# Patient Record
Sex: Male | Born: 1945 | Race: White | Hispanic: No | Smoking: Former smoker
Health system: Southern US, Community
[De-identification: ages and names within clinical notes are randomized; demographics above are authoritative.]

## PROBLEM LIST (undated history)

## (undated) DIAGNOSIS — G4730 Sleep apnea, unspecified: Secondary | ICD-10-CM

## (undated) DIAGNOSIS — I10 Essential (primary) hypertension: Secondary | ICD-10-CM

## (undated) DIAGNOSIS — I2510 Atherosclerotic heart disease of native coronary artery without angina pectoris: Secondary | ICD-10-CM

## (undated) DIAGNOSIS — M1990 Unspecified osteoarthritis, unspecified site: Secondary | ICD-10-CM

## (undated) DIAGNOSIS — M549 Dorsalgia, unspecified: Secondary | ICD-10-CM

## (undated) DIAGNOSIS — I219 Acute myocardial infarction, unspecified: Secondary | ICD-10-CM

## (undated) DIAGNOSIS — D860 Sarcoidosis of lung: Secondary | ICD-10-CM

## (undated) DIAGNOSIS — J449 Chronic obstructive pulmonary disease, unspecified: Secondary | ICD-10-CM

## (undated) DIAGNOSIS — G8929 Other chronic pain: Secondary | ICD-10-CM

## (undated) HISTORY — PX: HAND SURGERY: SHX662

## (undated) HISTORY — PX: OTHER SURGICAL HISTORY: SHX169

## (undated) HISTORY — DX: Dorsalgia, unspecified: M54.9

## (undated) HISTORY — PX: CARDIAC CATHETERIZATION: SHX172

## (undated) HISTORY — DX: Other chronic pain: G89.29

## (undated) HISTORY — DX: Atherosclerotic heart disease of native coronary artery without angina pectoris: I25.10

## (undated) HISTORY — DX: Acute myocardial infarction, unspecified: I21.9

## (undated) HISTORY — DX: Unspecified osteoarthritis, unspecified site: M19.90

## (undated) HISTORY — DX: Chronic obstructive pulmonary disease, unspecified: J44.9

## (undated) HISTORY — PX: BACK SURGERY: SHX140

## (undated) HISTORY — DX: Essential (primary) hypertension: I10

---

## 2009-03-28 ENCOUNTER — Ambulatory Visit: Payer: Self-pay | Admitting: Cardiology

## 2009-03-28 ENCOUNTER — Inpatient Hospital Stay (HOSPITAL_COMMUNITY): Admission: EM | Admit: 2009-03-28 | Discharge: 2009-03-31 | Payer: Self-pay | Admitting: Emergency Medicine

## 2009-03-29 ENCOUNTER — Encounter: Payer: Self-pay | Admitting: Cardiology

## 2009-04-09 DIAGNOSIS — I10 Essential (primary) hypertension: Secondary | ICD-10-CM | POA: Insufficient documentation

## 2009-04-09 DIAGNOSIS — M1990 Unspecified osteoarthritis, unspecified site: Secondary | ICD-10-CM

## 2009-04-09 DIAGNOSIS — I219 Acute myocardial infarction, unspecified: Secondary | ICD-10-CM | POA: Insufficient documentation

## 2009-04-09 DIAGNOSIS — M5136 Other intervertebral disc degeneration, lumbar region: Secondary | ICD-10-CM

## 2009-04-10 ENCOUNTER — Ambulatory Visit: Payer: Self-pay | Admitting: Cardiology

## 2009-04-10 ENCOUNTER — Encounter: Payer: Self-pay | Admitting: Cardiology

## 2009-04-10 DIAGNOSIS — E782 Mixed hyperlipidemia: Secondary | ICD-10-CM | POA: Insufficient documentation

## 2009-04-16 ENCOUNTER — Encounter: Payer: Self-pay | Admitting: Cardiology

## 2009-04-16 LAB — CONVERTED CEMR LAB
AST: 24 units/L
BUN: 16 mg/dL
Creatinine, Ser: 0.9 mg/dL
HCT: 38.8 %
Hemoglobin: 13.2 g/dL
MCV: 83.1 fL
Platelets: 262 10*3/uL
Potassium: 4 meq/L
Triglyceride fasting, serum: 82 mg/dL

## 2009-05-04 ENCOUNTER — Encounter: Payer: Self-pay | Admitting: Cardiology

## 2009-05-29 ENCOUNTER — Ambulatory Visit: Payer: Self-pay | Admitting: Cardiology

## 2009-05-29 ENCOUNTER — Ambulatory Visit: Payer: Self-pay

## 2010-06-04 ENCOUNTER — Telehealth (INDEPENDENT_AMBULATORY_CARE_PROVIDER_SITE_OTHER): Payer: Self-pay | Admitting: *Deleted

## 2010-12-19 ENCOUNTER — Ambulatory Visit: Payer: Self-pay | Admitting: Cardiology

## 2010-12-19 ENCOUNTER — Encounter: Payer: Self-pay | Admitting: Cardiology

## 2010-12-19 DIAGNOSIS — I2510 Atherosclerotic heart disease of native coronary artery without angina pectoris: Secondary | ICD-10-CM | POA: Insufficient documentation

## 2010-12-19 DIAGNOSIS — I10 Essential (primary) hypertension: Secondary | ICD-10-CM

## 2010-12-19 DIAGNOSIS — Z8673 Personal history of transient ischemic attack (TIA), and cerebral infarction without residual deficits: Secondary | ICD-10-CM

## 2010-12-27 ENCOUNTER — Ambulatory Visit: Payer: Self-pay

## 2010-12-27 ENCOUNTER — Ambulatory Visit (HOSPITAL_COMMUNITY)
Admission: RE | Admit: 2010-12-27 | Discharge: 2010-12-27 | Payer: Self-pay | Source: Home / Self Care | Attending: Cardiology | Admitting: Cardiology

## 2010-12-27 ENCOUNTER — Telehealth: Payer: Self-pay | Admitting: Cardiology

## 2011-01-08 ENCOUNTER — Telehealth: Payer: Self-pay | Admitting: Cardiology

## 2011-01-08 LAB — CONVERTED CEMR LAB
Basophils Relative: 1.6 % (ref 0.0–3.0)
Calcium: 9 mg/dL (ref 8.4–10.5)
Chloride: 101 meq/L (ref 96–112)
Cholesterol: 149 mg/dL (ref 0–200)
Creatinine, Ser: 1 mg/dL (ref 0.4–1.5)
Eosinophils Relative: 3.2 % (ref 0.0–5.0)
HDL: 31.3 mg/dL — ABNORMAL LOW (ref >39.00)
LDL Cholesterol: 80 mg/dL (ref 0–99)
Lymphocytes Relative: 27.6 % (ref 12.0–46.0)
Neutrophils Relative %: 59.8 % (ref 43.0–77.0)
RBC: 5.03 M/uL (ref 4.22–5.81)
Sodium: 137 meq/L (ref 135–145)
Triglycerides: 187 mg/dL — ABNORMAL HIGH (ref 0.0–149.0)
VLDL: 37.4 mg/dL (ref 0.0–40.0)
WBC: 7.3 10*3/uL (ref 4.5–10.5)

## 2011-01-28 NOTE — Progress Notes (Signed)
  Request Recieved from [facility] sent to [address] [facility] [place]  June 04, 2010 11:18 AM

## 2011-01-28 NOTE — Assessment & Plan Note (Signed)
Summary: [facility]   CC:  Post Hosp F/U.  History of Present Illness: No chest pain.  Does take meds for back, including NSAIDS, but has held off.  Meds were sent to the [place].  Gets pain meds for back at the [place].  Pepcid does not work as well as Omeprazole. Is doing relatively well. Pt a non smoker.  Current Medications (verified): 1)  Aspirin 81 Mg Tbec (Aspirin) .... Take One Tablet By Mouth Daily 2)  Crestor 40 Mg Tabs (Rosuvastatin Calcium) .... Take 1 Tablet By Mouth Once A Day 3)  Plavix 75 Mg Tabs (Clopidogrel Bisulfate) .... Take One Tablet By Mouth Daily 4)  Toprol Xl 50 Mg Xr24h-Tab (Metoprolol Succinate) .... Take 1 Once Daily 5)  Lisinopril 5 Mg Tabs (Lisinopril) .... Take One Tablet By Mouth Daily 6)  Nitrostat 0.4 Mg Subl (Nitroglycerin) .... One Tablet Under Tongue Every 5 Minutes As Needed For Chest Pain---May Repeat Times Three 7)  Omeprazole 20 Mg Tbec (Omeprazole) .... Take 1 Two Times A Day As Needed 8)  Potassium Chloride Crys Cr 20 Meq Cr-Tabs (Potassium Chloride Crys Cr) .... Take 1 As Needed 9)  Naproxen .... As Needed 10)  Combivent 103-18 Mcg/act Aero (Ipratropium-Albuterol) .... Take 2 Puffs Once Daily 11)  Cyclobenzaprine Hcl 10 Mg Tabs (Cyclobenzaprine Hcl) .... Take 1 Two Times A Day As Needed 12)  Hydrocodone-Acetaminophen 10-500 Mg Tabs (Hydrocodone-Acetaminophen) .... Take As Needed 13)  Loratadine 10 Mg Tabs (Loratadine) .... Take 1 As Needed 14)  Diclofenac Sodium 75 Mg Tbec (Diclofenac Sodium) .... Take 1 Two Times A Day 15)  Asmanex 60 Metered Doses 220 Mcg/inh Aepb (Mometasone Furoate) .... Take 1 At Bedtime 16)  Afrin Nasal Spray 0.05 % Soln (Oxymetazoline Hcl) .... As Needed  Allergies: 1)  ! Sulfa  Family History:     M alive at 21  no med prob    F  shot himself in mouth at 44    One brother all at [place]    One sister good health            Vital Signs:  Patient profile:   65 year old male Height:      72 inches Weight:      243  pounds BMI:     33.08 Pulse rate:   66 / minute BP sitting:   110 / 70  (left arm) Cuff size:   regular  Vitals Entered By: Stanton Kidney, EMT-P (April 10, 2009 12:29 PM)  Physical Exam  General:  Well developed, well nourished, in no acute distress. Lungs:  Clear to auscultation and percussion Heart:  Cor reg.  Stable.  No murmur, or rub.  Positive S4. Extremities:  No edema   EKG  Procedure date:  04/10/2009  Findings:      Normal at present  Impression & Recommendations:  Problem # 1:  MYOCARDIAL INFARCTION, ACUTE (ICD-410.90)  His updated medication list for this problem includes:    Aspirin 81 Mg Tbec (Aspirin) .Marland Kitchen... Take one tablet by mouth daily    Plavix 75 Mg Tabs (Clopidogrel bisulfate) .Marland Kitchen... Take one tablet by mouth daily    Toprol Xl 50 Mg Xr24h-tab (Metoprolol succinate) .Marland Kitchen... Take 1 once daily    Lisinopril 5 Mg Tabs (Lisinopril) .Marland Kitchen... Take one tablet by mouth daily    Nitrostat 0.4 Mg Subl (Nitroglycerin) ..... One tablet under tongue every 5 minutes as needed for chest pain---may repeat times three Stable at present.  Has more reflux.  May resume Omeprazole at 30 days as opposed to Pepcid. May resume driving.  Problem # 2:  HYPERTENSION (ICD-401.9)  His updated medication list for this problem includes:    Aspirin 81 Mg Tbec (Aspirin) .Marland Kitchen... Take one tablet by mouth daily    Toprol Xl 50 Mg Xr24h-tab (Metoprolol succinate) .Marland Kitchen... Take 1 once daily    Lisinopril 5 Mg Tabs (Lisinopril) .Marland Kitchen... Take one tablet by mouth daily Stable  Problem # 3:  BACK PAIN, CHRONIC (ICD-724.5) Issues of NSAIDS reviewed with patient.  Problem # 4:  HYPERLIPIDEMIA TYPE IIB / III (ICD-272.2)  His updated medication list for this problem includes:    Crestor 40 Mg Tabs (Rosuvastatin calcium) .Marland Kitchen... Take 1 tablet by mouth once a day Need followup laboratories at the [place].  Patient Instructions: 1)  Copy of CT from the hospital given to patient  to carry to the [place]. 2)  Your  physician recommends that you return for lab work in: Labs to be drawn at the [place]. Order provided for BMET, CBC, LIPID. 3)  Your physician has requested that you have an exercise tolerance test.  For further information please visit [email or web address]  Please also follow instruction sheet, as given. (6 weeks)

## 2011-01-30 NOTE — Assessment & Plan Note (Signed)
Summary: past due follow up/mt   Visit Type:  Follow-up  CC:  chest pains.  History of Present Illness: Patient had a shortly lived episode of left arm, leg weakness, although without speech difficulties.  It last for about an hour, and then was a little noticeable next day.  None since.   Denies significant chest pain.  Was seen at [facility], and workup not recommended.    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 2 Tablets Am and 2 Tablets Pm 2)  Simvastatin 40 Mg Tabs (Simvastatin) .... Take 1/2 Tablet By Mouth Daily At Bedtime 3)  Metoprolol Tartrate 50 Mg Tabs (Metoprolol Tartrate) .... Take One Tablet By Mouth Twice A Day 4)  Nitrostat 0.4 Mg Subl (Nitroglycerin) .... One Tablet Under Tongue Every 5 Minutes As Needed For Chest Pain---May Repeat Times Three 5)  Omeprazole 20 Mg Tbec (Omeprazole) .... Take 1 Two Times A Day As Needed 6)  Potassium Chloride Crys Cr 20 Meq Cr-Tabs (Potassium Chloride Crys Cr) .... Take 1 As Needed 7)  Combivent 103-18 Mcg/act Aero (Ipratropium-Albuterol) .... Take 2 Puffs Once Daily 8)  Cyclobenzaprine Hcl 10 Mg Tabs (Cyclobenzaprine Hcl) .... Take 1 Two Times A Day As Needed 9)  Percocet 5-325 Mg Tabs (Oxycodone-Acetaminophen) .... As Needed 10)  Loratadine 10 Mg Tabs (Loratadine) .... Take 1 As Needed 11)  Afrin Nasal Spray 0.05 % Soln (Oxymetazoline Hcl) .... As Needed 12)  Spiriva Handihaler 18 Mcg Caps (Tiotropium Bromide Monohydrate) .... 2 Puffs Two Times A Day  Allergies: 1)  ! Sulfa  Vital Signs:  Patient profile:   65 year old male Height:      72 inches Weight:      262 pounds BMI:     35.66 Pulse  rate:   61 / minute Pulse rhythm:   regular Resp:     18 per minute BP sitting:   134 / 88  (left arm) Cuff size:   large  Vitals Entered By: Vikki Ports (December 19, 2010 2:45 PM)  Physical Exam  General:  Well developed, well nourished, in no acute distress. Head:  normocephalic and atraumatic Eyes:  PERRLA/EOM intact; conjunctiva and lids normal. Neck:  No carotid bruits.   Lungs:  Clear bilaterally to auscultation and percussion. Heart:  PMI non displaced. NOrmal S1 and S2.  No gallop rub or murmur.   Abdomen:  Bowel sounds positive; abdomen soft and non-tender without masses, organomegaly, or hernias noted. No hepatosplenomegaly. Pulses:  pulses normal in all 4 extremities.  No femoral bruits.  Extremities:  No clubbing or cyanosis.   EKG  Procedure date:  12/19/2010  Findings:      NSR.  WNL.  Cardiac Cath  Procedure date:  03/28/2009  Findings:       CONCLUSIONS:   1. Mild reduction in left ventricular function with an apical wall       motion abnormality.   2. Ruptured plaque in the LAD just after the takeoff the diagonal       successfully stented using a non-drug-eluting platform with       excellent post dilatation.      DISPOSITION:  The patient will be treated medically.  He will be given   Plavix.  He will probably require 3-4 days of hospitalization.  We will   review the options with him.   Impression & Recommendations:  Problem # 1:  CAD, NATIVE VESSEL (ICD-414.01) stable at present.  rare episode of chest pain.  Denies progressive symptoms. The following medications were removed from the medication list:    Plavix 75 Mg Tabs (Clopidogrel bisulfate) .Marland Kitchen... Take one tablet by mouth daily    Lisinopril 5 Mg Tabs (Lisinopril) .Marland Kitchen... Take one tablet by mouth daily His updated medication list for this problem includes:    Aspirin 81 Mg Tbec (Aspirin) .Marland Kitchen... 2 tablets am and 2 tablets pm    Metoprolol Tartrate 50 Mg Tabs (Metoprolol tartrate) .Marland Kitchen... Take  one tablet by mouth twice a day    Nitrostat 0.4 Mg Subl (Nitroglycerin) ..... One tablet under tongue every 5 minutes as needed for chest pain---may repeat times three    Amlodipine Besylate 5 Mg Tabs (Amlodipine besylate) .Marland Kitchen... Take one tablet by mouth daily  Problem # 2:  HYPERTENSION, BENIGN (ICD-401.1) Not well controlled.  Will add amlodipine to improve BP control The following medications were removed from the medication list:    Lisinopril 5 Mg Tabs (Lisinopril) .Marland Kitchen... Take one tablet by mouth daily His updated medication list for this problem includes:    Aspirin 81 Mg Tbec (Aspirin) .Marland Kitchen... 2 tablets am and 2 tablets pm    Metoprolol Tartrate 50 Mg Tabs (Metoprolol tartrate) .Marland Kitchen... Take one tablet by mouth twice a day    Amlodipine Besylate 5 Mg Tabs (Amlodipine besylate) .Marland Kitchen... 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 Take one tablet by mouth daily at bedtime  Orders: TLB-BMP (Basic Metabolic Panel-BMET) (80048-METABOL) TLB-CBC Platelet - w/Differential (85025-CBCD) TLB-TSH (Thyroid Stimulating Hormone) (84443-TSH) TLB-Lipid Panel (80061-LIPID)  Problem # 4:  TIA (ICD-435.9) Not clear.  Episode was brief.  He was seen at [facility] and no workup recommended.  I suggested MRA, MRI, carotid doppler, although all likely to be negative, and patient decided against at the present time.  He did agree to an echocardiogram to rule out mural thrombus.  Remains on ASA.  Improve BP control.  Change lipid agent secondary to use of amlodipine.    Other Orders: EKG w/ Interpretation (93000) Echocardiogram (Echo)  Patient Instructions: 1)  Your physician recommends that you schedule a follow-up appointment in 6 weeks.  2)  Your physician has requested that you have an echocardiogram.  Echocardiography is a  painless test that uses sound waves to create images of your heart. It provides your doctor with information about the size and shape of your heart and how well your heart's chambers and valves are working.  This procedure takes approximately one hour. There are no restrictions for this procedure. 3)  Your physician has recommended you make the following change in your medication: START NORVASC 5mg  by mouth daily and STOP SIMVASTATIN and START PRAVASTATIN 40mg  by mouth daily  Prescriptions: AMLODIPINE BESYLATE 5 MG TABS (AMLODIPINE BESYLATE) Take one tablet by mouth daily  #30 x 6   Entered by:   Whitney Maeola Sarah RN   Authorized by:   Ronaldo Miyamoto, MD, [facility]   Signed by:   Whitney Maeola Sarah RN  on 12/19/2010   Method used:   [facility] to        [facility] [phone]* (retail)       [address]       [place], [place]  [address]       Ph: [phone] or [phone]       Fax: [phone]   RxID:   [id number] PRAVASTATIN SODIUM 40 MG TABS (PRAVASTATIN SODIUM) Take one tablet by mouth daily at bedtime  #30 x 6   Entered by:   Whitney Maeola Sarah RN   Authorized by:   Ronaldo Miyamoto, MD, [facility]   Signed by:   Ellender Hose RN on 12/19/2010   Method used:   [facility] to        [facility] [phone]* (retail)       [address]       [place], [place]  [address]       Ph: [phone] or [phone]       Fax: [phone]   RxID:   [id number]

## 2011-01-30 NOTE — Progress Notes (Addendum)
Summary: Reaction to Amlodipine  Phone Note Other Incoming   Details for Reason: reaction to Amlodipine Summary of Call: ++++this is not a phone note+++ pt in office today for 2d and mentioned to the front desk that he had a reaction this past Wednesday to Amlodipine. Pt c/o tongue and face swelling and difficulty breathing. He did not seek emergency care.  He has restarted his Metoprolol.  Will discuss w/DOD to see if additional medication needs to be added for BP control. Initial call taken by: Claris Gladden RN,  December 27, 2010 8:54 AM  Follow-up for Phone Call        spoke w/DOD and he will not prescribe further bp meds at this time. adv pt to continue w/Metoprolol and he expressed understanding.  Also adv pt that Dr. Riley Kill would be back in the office in a week and would review this message and would make further recommendations.  Pt checked BP today and systolic was 135.  Follow-up by: Claris Gladden RN,  December 27, 2010 10:26 AM

## 2011-01-30 NOTE — Progress Notes (Signed)
Summary: Echo results  Phone Note Call from Patient   Caller: Patient [phone] Reason for Call: Talk to Nurse, Lab or Test Results Summary of Call: pt calling re results of [place] on his heart Initial call taken by: Glynda Jaeger,  January 08, 2011 9:05 AM  Follow-up for Phone Call        I spoke with the pt and gave him the results of echo and labwork.  The pt stopped amlodipine due to reaction and is taking metoprolol.  The pt has not been monitoring his BP at home.  I asked the pt to start checking his BP and bring readings into his appt on 2/14 with Dr Riley Kill. The pt's statin was also changed to Pravastatin on 12/22.  The pt will have follow-up labs at his appt with Dr Riley Kill.  Follow-up by: Julieta Gutting, RN, BSN,  January 08, 2011 9:55 AM   New Allergies: ! AMLODIPINE BESYLATE New Allergies: ! AMLODIPINE BESYLATE

## 2011-02-11 ENCOUNTER — Encounter: Payer: Self-pay | Admitting: Cardiology

## 2011-02-11 ENCOUNTER — Ambulatory Visit (INDEPENDENT_AMBULATORY_CARE_PROVIDER_SITE_OTHER): Payer: Medicaid Other | Admitting: Cardiology

## 2011-02-11 DIAGNOSIS — I10 Essential (primary) hypertension: Secondary | ICD-10-CM

## 2011-02-11 DIAGNOSIS — G4733 Obstructive sleep apnea (adult) (pediatric): Secondary | ICD-10-CM | POA: Insufficient documentation

## 2011-02-11 DIAGNOSIS — I2510 Atherosclerotic heart disease of native coronary artery without angina pectoris: Secondary | ICD-10-CM

## 2011-02-11 DIAGNOSIS — E7800 Pure hypercholesterolemia, unspecified: Secondary | ICD-10-CM

## 2011-02-25 ENCOUNTER — Telehealth (INDEPENDENT_AMBULATORY_CARE_PROVIDER_SITE_OTHER): Payer: Self-pay | Admitting: *Deleted

## 2011-02-25 NOTE — Assessment & Plan Note (Signed)
Summary: per check out/sf need lipid and liver/pt will come in fasting...   Visit Type:  Follow-up  CC:  Allergic reaction to Amlodipine .  History of Present Illness: In general feels pretty good. The BP has come back in line.  He is now taking a whole beta blocker.  Had a reaction to amlodipine when he took.  It sounded real.   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 Take 1 Tablet By Mouth Once A Day 2)  Pravastatin Sodium 40 Mg Tabs (Pravastatin Sodium) .... Take One Tablet By Mouth Daily At Bedtime 3)  Metoprolol Tartrate 50 Mg Tabs (Metoprolol Tartrate) .... Take One Tablet By Mouth Twice A Day 4)  Nitrostat 0.4 Mg Subl (Nitroglycerin) .... One Tablet Under Tongue Every 5 Minutes As Needed For Chest Pain---May Repeat Times Three 5)  Omeprazole 20 Mg Tbec (Omeprazole) .... Take 1 Two Times A Day As Needed 6)  Potassium Chloride Crys Cr 20 Meq Cr-Tabs (Potassium Chloride Crys Cr) .... Take 1 As Needed 7)  Combivent 103-18 Mcg/act Aero (Ipratropium-Albuterol) .... Take 2 Puffs Once Daily 8)  Cyclobenzaprine Hcl 10 Mg Tabs (Cyclobenzaprine Hcl) .... Take 1 Two Times A Day As Needed 9)  Percocet 5-325 Mg Tabs (Oxycodone-Acetaminophen) .... As Needed 10)  Loratadine 10 Mg Tabs (Loratadine) .... Take 1 As Needed 11)  Afrin Nasal Spray 0.05 % Soln (Oxymetazoline Hcl) .... As Needed 12)  Spiriva Handihaler 18 Mcg Caps (Tiotropium Bromide Monohydrate) .... 2 Puffs Two Times A Day  Allergies: 1)  ! Sulfa 2)  ! Amlodipine Besylate  Vital Signs:  Patient profile:   65 year old male Height:      72  inches Weight:      255 pounds BMI:     34.71 Pulse rate:   68 / minute Pulse rhythm:   regular Resp:     18 per minute BP sitting:   114 / 75  (right arm) Cuff size:   large  Vitals Entered By: Vikki Ports (February 11, 2011 3:38 PM)  Physical Exam  General:  Well developed, well nourished, in no acute distress. Head:  normocephalic and atraumatic Eyes:  PERRLA/EOM intact; conjunctiva and lids normal. Lungs:  Clear bilaterally to auscultation and percussion. Heart:  PMI non displaced. Normal S1 and S2.  No murmur or rub.   Abdomen:  Bowel sounds positive; abdomen soft and non-tender without masses, organomegaly, or hernias noted. No hepatosplenomegaly. Pulses:  pulses normal in all 4 extremities Extremities:  No clubbing or cyanosis. Neurologic:  Alert and oriented x 3.   Impression & Recommendations:  Problem # 1:  CAD, NATIVE VESSEL (ICD-414.01) remaining stable at the present time.  No active chest pain. His updated medication list for this problem includes:    Aspirin 81 Mg Tbec (Aspirin) .Marland Kitchen... Take 1 tablet by mouth once a day    Metoprolol Tartrate 50 Mg Tabs (Metoprolol tartrate) .Marland Kitchen... Take one tablet by mouth twice a day    Nitrostat 0.4 Mg Subl (Nitroglycerin) ..... One tablet under tongue every 5 minutes as needed for chest pain---may repeat times three  Problem # 2:  HYPERTENSION, BENIGN (ICD-401.1) better controlled at the present time.  Will continue use of metoprolol. His updated medication list for this problem includes:    Aspirin 81 Mg Tbec (Aspirin) .Marland Kitchen... Take 1 tablet by mouth once a day    Metoprolol Tartrate 50 Mg Tabs (Metoprolol tartrate) .Marland Kitchen... 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 His updated medication list for this problem includes:    Pravastatin Sodium 40 Mg Tabs (Pravastatin sodium) .Marland Kitchen... Take one tablet by mouth daily at bedtime  Problem # 4:  SLEEP  APNEA, OBSTRUCTIVE (ICD-327.23)  Needs adjustment of his CPAP.  Has a machine.  Will get office visit with sleep medicine.   Orders: Pulmonary Referral (Pulmonary)  Patient Instructions: 1)  Your physician recommends that you schedule a follow-up appointment in: 4 months with Dr. Riley Kill 2)  Your physician recommends that you continue on your current medications as directed. Please refer to the Current Medication list given to you today. 3)  You have been referred to Dr. Shelle Iron for sleep referral

## 2011-03-04 ENCOUNTER — Institutional Professional Consult (permissible substitution): Payer: Medicaid Other | Admitting: Pulmonary Disease

## 2011-03-06 NOTE — Progress Notes (Signed)
Summary: refill meds  Phone Note Refill Request Message from:  Patient on February 25, 2011 10:35 AM  Refills Requested: Medication #1:  METOPROLOL TARTRATE 50 MG TABS Take one tablet by mouth twice a [facility] [place]    Method Requested: Fax to Local Pharmacy Initial call taken by: Lorne Skeens,  February 25, 2011 10:37 AM  Follow-up for Phone Call        Rx faxed to pharmacy. Vikki Ports  February 25, 2011 3:52 PM     Prescriptions: METOPROLOL TARTRATE 50 MG TABS (METOPROLOL TARTRATE) Take one tablet by mouth twice a day  #60 x 11   Entered by:   Vikki Ports   Authorized by:   Ronaldo Miyamoto, MD, [facility]   Signed by:   Vikki Ports on 02/25/2011   Method used:   [facility]       [facility]  [address] [phone]* (retail)       [address]       [place], [place]  [address]       Ph: [phone] or [phone]       Fax: [phone]   RxID:   [id number]

## 2011-04-09 LAB — BASIC METABOLIC PANEL
BUN: 15 mg/dL (ref 6–23)
CO2: 31 mEq/L (ref 19–32)
CO2: 32 mEq/L (ref 19–32)
Calcium: 7.9 mg/dL — ABNORMAL LOW (ref 8.4–10.5)
Calcium: 9.2 mg/dL (ref 8.4–10.5)
Chloride: 101 mEq/L (ref 96–112)
Creatinine, Ser: 0.97 mg/dL (ref 0.4–1.5)
GFR calc Af Amer: >60 mL/min (ref >60)
GFR calc non Af Amer: >60 mL/min (ref >60)
Glucose, Bld: 132 mg/dL — ABNORMAL HIGH (ref 70–99)
Glucose, Bld: 96 mg/dL (ref 70–99)
Potassium: 4.2 mEq/L (ref 3.5–5.1)
Sodium: 139 mEq/L (ref 135–145)

## 2011-04-09 LAB — LIPID PANEL
HDL: 25 mg/dL — ABNORMAL LOW (ref >39)
Total CHOL/HDL Ratio: 6.1 RATIO

## 2011-04-09 LAB — CARDIAC PANEL(CRET KIN+CKTOT+MB+TROPI)
Relative Index: 11.7 — ABNORMAL HIGH (ref 0.0–2.5)
Total CK: 942 U/L — ABNORMAL HIGH (ref 7–232)
Troponin I: 12.85 ng/mL (ref 0.00–0.06)
Troponin I: 14.68 ng/mL (ref 0.00–0.06)

## 2011-04-09 LAB — CBC
HCT: 35.4 % — ABNORMAL LOW (ref 39.0–52.0)
HCT: 37.4 % — ABNORMAL LOW (ref 39.0–52.0)
Hemoglobin: 12.4 g/dL — ABNORMAL LOW (ref 13.0–17.0)
Hemoglobin: 13.2 g/dL (ref 13.0–17.0)
MCHC: 35 g/dL (ref 30.0–36.0)
MCHC: 35.3 g/dL (ref 30.0–36.0)
MCV: 86.2 fL (ref 78.0–100.0)
RBC: 4.34 MIL/uL (ref 4.22–5.81)
RDW: 13.2 % (ref 11.5–15.5)

## 2011-04-09 LAB — DIFFERENTIAL
Basophils Absolute: 0 10*3/uL (ref 0.0–0.1)
Eosinophils Relative: 1 % (ref 0–5)
Lymphocytes Relative: 14 % (ref 12–46)
Monocytes Absolute: 0.7 10*3/uL (ref 0.1–1.0)

## 2011-04-10 LAB — POCT I-STAT, CHEM 8
Calcium, Ion: 1.04 mmol/L — ABNORMAL LOW (ref 1.12–1.32)
Chloride: 107 mEq/L (ref 96–112)
HCT: 43 % (ref 39.0–52.0)
Hemoglobin: 14.6 g/dL (ref 13.0–17.0)
Potassium: 4 mEq/L (ref 3.5–5.1)

## 2011-04-10 LAB — BASIC METABOLIC PANEL
BUN: 20 mg/dL (ref 6–23)
CO2: 23 mEq/L (ref 19–32)
Calcium: 8.8 mg/dL (ref 8.4–10.5)
GFR calc non Af Amer: >60 mL/min (ref >60)
Glucose, Bld: 141 mg/dL — ABNORMAL HIGH (ref 70–99)

## 2011-04-10 LAB — CBC
HCT: 41.9 % (ref 39.0–52.0)
MCHC: 35.3 g/dL (ref 30.0–36.0)
Platelets: 198 10*3/uL (ref 150–400)
RDW: 13.1 % (ref 11.5–15.5)

## 2011-04-10 LAB — POCT CARDIAC MARKERS: Troponin i, poc: <0.05 ng/mL (ref 0.00–0.09)

## 2011-05-13 NOTE — Discharge Summary (Signed)
NAMESAVIEN, MAMULA NO.:  [id number]   MEDICAL RECORD NO.:  [id number]          PATIENT TYPE:  INP   LOCATION:  2903                         FACILITY:  [facility]   PHYSICIAN:  Arturo Morton. Riley Kill, MD, FACCDATE OF BIRTH:  [birth year]   DATE OF ADMISSION:  03/28/2009  DATE OF DISCHARGE:                               DISCHARGE SUMMARY   PRIMARY CARDIOLOGIST:  Arturo Morton. Riley Kill, MD, [facility]   This is a new patient.   PRIMARY CARE PHYSICIAN:  Through the [facility].   REASON FOR ADMISSION:  An ST-elevated MI, anterior.   HISTORY OF PRESENT ILLNESS:  A 65 year old Caucasian male admitted with  complaints of chest pain, which began around 4:30 p.m. 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  On arrival, EMS gave him sublingual nitroglycerin and aspirin, which he  vomited.  The patient's only known history is hypertension and arthritis  with chronic back pain.  He was seen in the ER, started on heparin,  nitroglycerin, and given aspirin.  The patient was seen and examined  emergently by Dr. Shawnie Pons and Dr. Willa Rough in the ER and  brought emergently to the cardiac catheterization lab for  catheterization.   REVIEW OF SYSTEMS:  Positive for chest pain, shortness of breath,  nausea, vomiting.  All other systems reviewed, are found to be negative.   PAST MEDICAL HISTORY:  1. Hypertension.  2. Chronic back pain.   SOCIAL HISTORY:  He lives in [place].  He is self-employed.   FAMILY HISTORY:  Unknown at this time.  Unknown whether he smokes or  drinks alcohol at this time.   CURRENT MEDICATIONS:  The patient does not know their names, one  antihypertensive and one arthritis medication.   ALLERGIES:  SULFA.   CURRENT LABORATORY DATA:  Sodium 138, potassium 4.0, chloride 107, CO2  24, BUN 27, creatinine 1.1, glucose 133.  Hemoglobin 14.6,  hematocrit  43.   Chest x-ray revealing left lower lobe airspace disease, right base  atelectasis.  EKG revealing ST elevation anteriorly with a ventricular rate of 110  beats per minute.   PHYSICAL EXAMINATION:  VITAL SIGNS:  Blood pressure was 200/110, pulse  105, respirations 30, O2 sat 100% on a nonrebreather.  He weighs 120 kg.  GENERAL:  He is awake and alert complaining of chest pain and nausea.  HEENT:  Head is normocephalic and atraumatic.  Eyes, PERRLA.  Mucous  membranes of mouth pink and moist.  Tongue is midline.  NECK:  Supple.  No bruits.  No JVD.  CARDIOVASCULAR:  Tachycardic without murmurs, rubs, or gallops.  Pulses  are equal bilaterally without bruits.  LUNGS:  Bibasilar crackles without wheezes or rhonchi.  ABDOMEN:  Obese, nontender, 2+ bowel sounds.  EXTREMITIES:  Without clubbing, cyanosis, or edema.  NEUROLOGIC:  Cranial nerves II-XII are grossly intact.   IMPRESSION:  1. ST-elevated myocardial infarction, anterior.  2. Hypertension.   PLAN:  This is a 65 year old Caucasian male,  obese, with history of  hypertension, admitted with anterior ST-elevated MI, has been taken  emergently to cardiac catheterization lab where he is undergoing a  diagnostic cath per Dr. Shawnie Pons with intervention if necessary.  The patient will be followed throughout the hospitalization, making  further recommendations throughout hospital course.      Bettey Mare. Lyman Bishop, NP      Arturo Morton. Riley Kill, MD, [facility]  Electronically Signed    KML/MEDQ  D:  03/28/2009  T:  03/29/2009  Job:  [id number]

## 2011-05-13 NOTE — Cardiovascular Report (Signed)
NAMELAZAR, TIERCE NO.:  [id number]   MEDICAL RECORD NO.:  [id number]          PATIENT TYPE:  INP   LOCATION:  2903                         FACILITY:  [facility]   PHYSICIAN:  Brian Harrington. Riley Kill, MD, FACCDATE OF BIRTH:  09-29-1946   DATE OF PROCEDURE:  DATE OF DISCHARGE:                            CARDIAC CATHETERIZATION   INDICATIONS:  Brian Harrington is a 65 year old who presented to the emergency  room with ongoing chest pain.  He had mild elevation in the T-waves in  the anterior precordial leads.  He also had some back pain and Dr.  Margretta Ditty was suspicious that he might have a dissection.  He was sent to  the laboratory for a CT scan.  This was interpreted by the radiologist  and reviewed by Dr. Fredia Sorrow who did not feel that the patient had an  aortic dissection.  He was brought back to the cath lab, and the cath  lab had been called in for another patient.  Because of his clinical  presentation, it was felt that he should go to the lab first.  Thus, the  patient was taken emergently to the cardiac catheterization laboratory.  We explained the procedure to him and the rationale behind it.  He was  agreeable to proceed.   PROCEDURE:  1. Left heart catheterization.  2. Selective coronary arteriography.  3. Selective left ventriculography.  4. Percutaneous stenting of the left anterior descending artery using      non drug-eluting platform.   DESCRIPTION OF PROCEDURE:  The patient was brought to the  catheterization laboratory, prepped and draped in the usual fashion.  Through an anterior puncture, the femoral artery was easily entered.  There was slight oozing.  A 6-French sheath was initially placed.  Views  of the left coronary artery were obtained.  We then used a JL-3.5 guide  catheter.  The patient had been given heparin and aspirin.  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  As a  result, preparations were  made for percutaneous intervention.  Bivalirudin was given according to  protocol.  We went ahead and administer 600 mg of Plavix.  A JL-3.5  guiding catheter was already utilized and a Prowater was placed down the  LAD.  Predilatations were done with a 2.0 and 2.5 balloon.  With this,  there was abrupt closure of the septal perforator, but with  intracoronary verapamil, the septal perforator opened back up and there  was relief of plane.  The patient was not thought to be a good candidate  for drug-eluting stent, and given the size of the vessel and the  location, it was felt that a non-drug-eluting stent might be ideal.  The  patient was dilated and stented using a 3.0 x 20 very flex stent which  was taken to about 14 atmospheres.  Post dilatations were then done with  a 3.5 x 12 Voyager O'Brien and dilatations done up to about 10 atmospheres.  There is marked improvement in the appearance of  the artery.  Central  aortic and left ventricular pressure was measured with pigtail catheter  and ventriculography was performed in the RAO projection.  Following  this, the femoral sheath demonstrated some oozing, and so we therefore,  upgraded from a 6-7 [place] sheath.  It was secured into place and he was  taken to the holding area in satisfactory clinical condition.   HEMODYNAMIC DATA:  1. Central aortic pressure was 137/95, mean 115.  2. Left ventricular pressure 140/90.  3. No gradient pullback across aortic valve.   ANGIOGRAPHIC DATA:  1. Right coronary artery has a mid 50% stenosis consistent with      posterior descending branch.  2. The left main was free of disease.  3. The LAD demonstrates a 90% stenosis, which is in the LAO view very      typical of a ruptured plaque with haziness.  This was after the      moderate-size diagonal branch.  Following balloon dilatation as      noted, there was loss of the septal perforator, but this opened up      after  intracoronary verapamil.  There was minimal pinching of the      diagonal branch with stenting, but there was excellent flow into      the diagonal.  The margins of the stent looked good following      dilatation without evidence of malposition angiographically, and      with no evidence of edge tear.  There was excellent runoff into the      distal vessel.  4. The circumflex is a large-caliber vessel with mild irregularity in      the first marginal.  5. The ventriculogram demonstrates ejection fraction estimate of 45%      with some apical dyskinesis.   CONCLUSIONS:  1. Mild reduction in left ventricular function with an apical wall      motion abnormality.  2. Ruptured plaque in the LAD just after the takeoff the diagonal      successfully stented using a non-drug-eluting platform with      excellent post dilatation.   DISPOSITION:  The patient will be treated medically.  He will be given  Plavix.  He will probably require 3-4 days of hospitalization.  We will  review the options with him.      Brian Harrington. Riley Kill, MD, [facility]  Electronically Signed     TDS/MEDQ  D:  03/28/2009  T:  03/29/2009  Job:  [id number]

## 2011-05-13 NOTE — Discharge Summary (Signed)
NAMESHAD, LEDVINA NO.:  [id number]   MEDICAL RECORD NO.:  [id number]          PATIENT TYPE:  INP   LOCATION:  2016                         FACILITY:  [facility]   PHYSICIAN:  Hillis Range, MD       DATE OF BIRTH:  [birth year]   DATE OF ADMISSION:  03/28/2009  DATE OF DISCHARGE:  03/31/2009                               DISCHARGE SUMMARY   PRIMARY CARDIOLOGIST:  Maisie Fus D. Riley Kill, MD, [facility]   PRIMARY CARE PHYSICIAN:  Through [facility].   DISCHARGE DIAGNOSIS:  Anterior ST-elevation myocardial infarction  (status post bare-metal stent to left anterior descending).   SECONDARY DIAGNOSES:  1. Hypertension.  2. Chronic back pain.  3. Arthritis.   ALLERGIES:  SULFA.   PROCEDURES PERFORMED DURING THIS HOSPITALIZATION:  1. The patient had EKG performed on March 28, 2009, that showed ST      elevation anteriorly with ventricular rate of 110 beats per minute.  2. The patient had a chest x-ray on March 28, 2009, that showed:      a.     Left lower lobe airspace disease, suspicious for pneumonia.      b.     Right base atelectasis.  3. The patient had a CT angio of the abdomen on March 28, 2009, that      showed:      a.     No acute upper abdominal CT findings.      b.     Duplicated IVC.      c.     Enlarged retroperitoneal lymph nodes, nonspecific, careful       clinical correlation advised.  4. The patient had CT angio of the pelvis on March 28, 2009, that      showed no acute pelvic CT findings.  5. The patient had a CT angio of the chest on March 28, 2009, that      showed:      a.     No specific features to suggest aortic dissection.      b.     Pulsation artifact within the aortic root.      c.     Mild edema and bibasilar atelectasis.      d.     Pulmonary nodule within the left upper lobe.  If the patient       is a smoker or has risk factors for bronchogenic carcinoma,       followup chest CT at 1 year is recommended.      e.     Borderline  enlarged mediastinal and right hilar lymph nodes.       Attention on followup examination advised.  6. The patient had cardiac catheterization on March 28, 2009, that      showed:      a.     Mild reduction in left ventricular function with an apical       wall motion abnormality.      b.     Ruptured plaque in the LAD just after the take-off of the  diagonal, successfully stented using a non drug-eluting platform       with excellent post dilatation.  7. The patient had EKG performed on March 29, 2009, showing sinus      rhythm with PVC, ventricular rate of 95 bpm, no acute ST-T wave      changes, normal axis, and no evidence of hypertrophy, PR 182, QRS      98, and QTc 475.  8. The patient had chest x-ray on March 29, 2009, said improving      bibasilar edema/infiltrate, and the patient had another chest x-ray      on March 30, 2009, that said suboptimal inspiration accounts for      bibasilar atelectasis.  No acute cardiopulmonary disease,      otherwise.  9. A 2D echocardiogram performed on March 29, 2009, showed LVEF      estimated, 55% to 60%.  Akinesis of septal wall, possible      hypokinesis of inferior wall.  Anterior wall not seen well but      appears to move well.  LV wall thickness upper limits of normal.   BRIEF HISTORY OF PRESENT ILLNESS:  Mr. Drew is a 65 year old Caucasian  male admitted with complaints of chest pain, which began around 4:30  p.m. 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  He began to  have substernal chest pain with associated shortness of breath and  diaphoresis and called EMS.  On arrival, EMS gave him sublingual  nitroglycerin and aspirin, which he subsequently vomited.  The patient's  only known history is hypertension and arthritis with chronic back pain.  He was seen in the ER, started on heparin, nitroglycerin, and given  aspirin.  The patient was seen and examined emergently by Dr. Shawnie Pons and Dr. Willa Rough and brought  emergently to the cath lab.   HOSPITAL COURSE:  The patient was admitted and underwent procedures  described above.  He tolerated them well without significant  complications.  The patient was transferred to telemetry on March 29, 2009, and cardiac rehab was initiated on March 30, 2009.  The patient  continued to improve and vital signs remained stable and the patient was  deemed appropriate for discharge on March 31, 2009.  The patient will  follow up with Dr. Riley Kill in the next 2 weeks and will continue on  aspirin, Plavix, Toprol, lisinopril, and Crestor.  At the time of  discharge, the patient's most recent vital signs were temperature 97.5  degrees Fahrenheit, BP 120/84, pulse 97, respiration rate 18, and O2  saturation 97% on room air.  At the time of discharge, the patient was  given a list of his new medications, prescriptions, postcath  instructions, and followup instructions in both oral and written form.  At that time, he had no questions or concerns that had not been  addressed.  Of note, the patient was involved in the Plavix Assistance  Program.   DISCHARGE LABORATORY DATA:  WBC 4.9, HGB 13.2, HCT 37.4, and PLT count  186.  Sodium 139, potassium 3.6, chloride 103, CO2 of 31, BUN 16,  creatinine 0.97, glucose 16, calcium 9.2, total cholesterol 152,  triglycerides 102, HDL 25, LDL 107, and VLDL 20.  Last set of cardiac  markers down trending with CK 713, CK-MB 61.3, and troponin-I 12.85.   FOLLOWUP PLANS AND APPOINTMENTS:  Please see hospital course.   DISCHARGE MEDICATIONS:  1. Aspirin 81 mg p.o. daily.  2. Crestor  40 mg p.o. daily.  3. Plavix 75 mg p.o. daily.  4. Toprol-XL 50 mg p.o. daily.  5. Lisinopril 5 mg p.o. daily.  6. Nitroglycerin 0.4 mg sublingual p.r.n. for chest pain.  7. Pepcid 40 mg p.o. p.r.n. for heartburn.   Please note that the duration of discharge encounter including physician  time was 45 minutes.      Jarrett Ables, [facility]      Hillis Range, MD  Electronically Signed    MS/MEDQ  D:  03/31/2009  T:  04/01/2009  Job:  [id number]

## 2011-05-13 NOTE — Discharge Summary (Signed)
NAME:  Brian Harrington, Brian Harrington NO.:  [id number]   MEDICAL RECORD NO.:  [id number]          PATIENT TYPE:  EMS   LOCATION:  MAJO                         FACILITY:  [facility]   PHYSICIAN:  Arturo Morton. Riley Kill, MD, FACCDATE OF BIRTH:  1946-05-21   DATE OF ADMISSION:  03/28/2009  DATE OF DISCHARGE:                               DISCHARGE SUMMARY   PRIMARY CARDIOLOGIST:  [facility].   PRIMARY CARDIOLOGIST AT THIS TIME:  Arturo Morton. Riley Kill, MD, [facility].   [facility]   Dictation ended at this point.      Bettey Mare. Lyman Bishop, NP      Arturo Morton. Riley Kill, MD, [facility]  Electronically Signed    KML/MEDQ  D:  03/28/2009  T:  03/29/2009  Job:  [id number]

## 2011-06-04 ENCOUNTER — Ambulatory Visit: Payer: Medicaid Other | Admitting: Cardiology

## 2011-06-05 ENCOUNTER — Encounter: Payer: Self-pay | Admitting: Cardiology

## 2011-07-11 ENCOUNTER — Encounter: Payer: Self-pay | Admitting: Cardiology

## 2011-07-11 ENCOUNTER — Ambulatory Visit (INDEPENDENT_AMBULATORY_CARE_PROVIDER_SITE_OTHER): Payer: Medicaid Other | Admitting: Cardiology

## 2011-07-11 VITALS — BP 110/70 | HR 64 | Resp 18 | Ht 72.0 in | Wt 261.0 lb

## 2011-07-11 DIAGNOSIS — I2510 Atherosclerotic heart disease of native coronary artery without angina pectoris: Secondary | ICD-10-CM

## 2011-07-11 DIAGNOSIS — E782 Mixed hyperlipidemia: Secondary | ICD-10-CM

## 2011-07-11 DIAGNOSIS — Z87828 Personal history of other (healed) physical injury and trauma: Secondary | ICD-10-CM

## 2011-07-11 NOTE — Patient Instructions (Signed)
Your physician recommends that you schedule a follow-up appointment in: 6 months with Dr. Riley Kill  Please come in fasting for your 6 month appointment so we can draw your fasting cholesterol level  Your physician recommends that you return for lab work in: 6 months for fasting cholesterol level.  The same day you see Dr. Riley Kill

## 2011-07-12 DIAGNOSIS — Z87828 Personal history of other (healed) physical injury and trauma: Secondary | ICD-10-CM | POA: Insufficient documentation

## 2011-07-12 NOTE — Assessment & Plan Note (Signed)
Overall close to target on pravastatin 40.  Weight loss encouraged.  If successful, might not need increase in lipid lowering dose.
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
## 2011-07-12 NOTE — Assessment & Plan Note (Signed)
Continues to remain stable.  No cardiac limitations.  Non DES was used.  Habits are better, but would benefit from reduction in BMI.

## 2012-01-06 ENCOUNTER — Ambulatory Visit (INDEPENDENT_AMBULATORY_CARE_PROVIDER_SITE_OTHER): Payer: Medicaid Other | Admitting: Cardiology

## 2012-01-06 ENCOUNTER — Encounter: Payer: Self-pay | Admitting: Cardiology

## 2012-01-06 VITALS — BP 122/86 | HR 54 | Ht 72.0 in | Wt 255.0 lb

## 2012-01-06 DIAGNOSIS — Z9889 Other specified postprocedural states: Secondary | ICD-10-CM

## 2012-01-06 DIAGNOSIS — I2510 Atherosclerotic heart disease of native coronary artery without angina pectoris: Secondary | ICD-10-CM

## 2012-01-06 DIAGNOSIS — I10 Essential (primary) hypertension: Secondary | ICD-10-CM

## 2012-01-06 DIAGNOSIS — E782 Mixed hyperlipidemia: Secondary | ICD-10-CM

## 2012-01-06 LAB — LIPID PANEL
HDL: 32.8 mg/dL — ABNORMAL LOW (ref >39.00)
LDL Cholesterol: 73 mg/dL (ref 0–99)
Total CHOL/HDL Ratio: 4
Triglycerides: 80 mg/dL (ref 0.0–149.0)

## 2012-01-06 LAB — TSH: TSH: 3.03 u[IU]/mL (ref 0.35–5.50)

## 2012-01-06 LAB — HEPATIC FUNCTION PANEL
AST: 24 U/L (ref 0–37)
Albumin: 4.3 g/dL (ref 3.5–5.2)
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
## 2012-01-06 NOTE — Assessment & Plan Note (Signed)
Stable. No current pain.  Continue meds.

## 2012-01-06 NOTE — Assessment & Plan Note (Signed)
Needs lipid and liver profile.  Will also get TSH as he has had prior thyroidectomy

## 2012-01-06 NOTE — Assessment & Plan Note (Signed)
Stable at present.   

## 2012-01-06 NOTE — Assessment & Plan Note (Signed)
Weight is up, but fluctuates.  Recheck TSH.  Has not had in a long time.

## 2012-01-06 NOTE — Patient Instructions (Signed)
Your physician recommends that you have lab work today: LIPID, LIVER, TSH  Your physician wants you to follow-up in: 6 MONTHS.  You will receive a reminder letter in the mail two months in advance. If you don't receive a letter, please call our office to schedule the follow-up appointment.  Your physician recommends that you continue on your current medications as directed. Please refer to the Current Medication list given to you today.

## 2012-04-29 ENCOUNTER — Other Ambulatory Visit: Payer: Self-pay

## 2012-04-29 MED ORDER — PRAVASTATIN SODIUM 40 MG PO TABS: 40.0000 mg | ORAL_TABLET | Freq: Every day | ORAL | Status: DC

## 2012-07-06 ENCOUNTER — Encounter: Payer: Self-pay | Admitting: Cardiology

## 2012-07-06 ENCOUNTER — Ambulatory Visit (INDEPENDENT_AMBULATORY_CARE_PROVIDER_SITE_OTHER): Payer: Medicaid Other | Admitting: Cardiology

## 2012-07-06 VITALS — BP 122/82 | HR 58 | Ht 72.0 in | Wt 251.0 lb

## 2012-07-06 DIAGNOSIS — I2510 Atherosclerotic heart disease of native coronary artery without angina pectoris: Secondary | ICD-10-CM

## 2012-07-06 DIAGNOSIS — Z87828 Personal history of other (healed) physical injury and trauma: Secondary | ICD-10-CM

## 2012-07-06 DIAGNOSIS — R42 Dizziness and giddiness: Secondary | ICD-10-CM

## 2012-07-06 MED ORDER — METOPROLOL TARTRATE 25 MG PO TABS: 25.0000 mg | ORAL_TABLET | Freq: Two times a day (BID) | ORAL | Status: DC

## 2012-07-06 NOTE — Assessment & Plan Note (Signed)
No recurrent angina at the present time. Continued medical therapy indicated.

## 2012-07-06 NOTE — Assessment & Plan Note (Signed)
Because of this is unclear. He is mildly bradycardic. His blood pressures well controlled. I wonder whether he gets intermittently bradycardic, and we will attempt to reduce his metoprolol to 25 mg twice daily. We'll see him back in followup in 2 months. I've given him specific instructions to chart to check his blood pressure on regular basis, and let us know if it rises inordinately. He will also check his pulse.

## 2012-07-06 NOTE — Assessment & Plan Note (Signed)
He is continuing to get therapy for this.

## 2012-07-06 NOTE — Patient Instructions (Addendum)
Your physician has recommended you make the following change in your medication: DECREASE Metoprolol Tartrate to 25mg  take one by mouth twice a day  Your physician recommends that you schedule a follow-up appointment in: 2 MONTHS  Your physician has requested that you regularly monitor and record your blood pressure readings at home. Please use the same machine at the same time of day to check your readings and record them to bring to your follow-up visit.

## 2012-07-06 NOTE — Progress Notes (Signed)
HPI:  Patient seen in followup. In general he is been stable. He does get some spells of feeling lightheaded. It is not related to position it comes on rather suddenly. He denies any chest pain associated with this they're fairly brief and they disappear. He wonders whether it could be related to medications he is no longer taking clopidogrel.  Current Outpatient Prescriptions  Medication Sig Dispense Refill  . albuterol (PROVENTIL) (2.5 MG/3ML) 0.083% nebulizer solution Take 2.5 mg by nebulization every 6 (six) hours as needed.      Marland Kitchen albuterol-ipratropium (COMBIVENT) 18-103 MCG/ACT inhaler Inhale 2 puffs into the lungs daily.        . Ascorbic Acid (VITAMIN C PO) Take by mouth daily.      Marland Kitchen aspirin 81 MG tablet Take 81 mg by mouth daily.        . Cholecalciferol (VITAMIN D PO) Take by mouth.      . cyclobenzaprine (FLEXERIL) 10 MG tablet Take 10 mg by mouth 2 (two) times daily as needed.        . loratadine (CLARITIN) 10 MG tablet Take 10 mg by mouth daily as needed.        Marland Kitchen MAGNESIUM PO Take by mouth daily.      . metoprolol (LOPRESSOR) 50 MG tablet Take 50 mg by mouth 2 (two) times daily.        Marland Kitchen omeprazole (PRILOSEC) 20 MG capsule Take 20 mg by mouth 2 (two) times daily as needed.        Marland Kitchen oxyCODONE-acetaminophen (PERCOCET) 5-325 MG per tablet Take 1 tablet by mouth every 4 (four) hours as needed.        Marland Kitchen oxymetazoline (AFRIN) 0.05 % nasal spray as needed.        . potassium chloride SA (K-DUR,KLOR-CON) 20 MEQ tablet Take 20 mEq by mouth as needed.        . pravastatin (PRAVACHOL) 40 MG tablet Take 1 tablet (40 mg total) by mouth at bedtime.  30 tablet  3  . tiotropium (SPIRIVA HANDIHALER) 18 MCG inhalation capsule Take 2 puffs 2 times daily         Allergies  Allergen Reactions  . Amlodipine Besylate     REACTION: tongue and facial swelling, difficulty breathing  . Sulfonamide Derivatives     Past Medical History  Diagnosis Date  . Myocardial infarction     acute  .  Hypertension   . Back pain, chronic   . Arthritis     Past Surgical History  Procedure Date  . Cardiac catheterization     bare-metal stent to left anterior descending    No family history on file.  History   Social History  . Marital Status: Legally Separated    Spouse Name: N/A    Number of Children: N/A  . Years of Education: N/A   Occupational History  . Self employed    Social History Main Topics  . Smoking status: Former Games developer  . Smokeless tobacco: Not on file  . Alcohol Use: Not on file  . Drug Use: Not on file  . Sexually Active: Not on file   Other Topics Concern  . Not on file   Social History Narrative   Patient lives in [place].    ROS: Please see the HPI.  All other systems reviewed and negative.  PHYSICAL EXAM:  BP 122/82  Pulse 58  Ht 6' (1.829 m)  Wt 251 lb (113.853 kg)  BMI 34.04 kg/m2  General: Well developed, well nourished, in no acute distress. Head:  Normocephalic and atraumatic. Neck: no JVD Lungs: Clear to auscultation and percussion. Heart: Normal S1 and S2.  No murmur, rubs or gallops.  Abdomen:  Normal bowel sounds; soft; non tender; no organomegaly Pulses: Pulses normal in all 4 extremities. Extremities: No clubbing or cyanosis. No edema. Neurologic: Alert and oriented x 3.  EKG:  SB.  Otherwise normal.   ASSESSMENT AND PLAN:

## 2012-09-06 ENCOUNTER — Ambulatory Visit (INDEPENDENT_AMBULATORY_CARE_PROVIDER_SITE_OTHER): Payer: Medicaid Other | Admitting: Cardiology

## 2012-09-06 ENCOUNTER — Encounter: Payer: Self-pay | Admitting: Cardiology

## 2012-09-06 VITALS — BP 118/78 | HR 48 | Ht 72.0 in | Wt 250.0 lb

## 2012-09-06 DIAGNOSIS — I10 Essential (primary) hypertension: Secondary | ICD-10-CM

## 2012-09-06 DIAGNOSIS — E7800 Pure hypercholesterolemia, unspecified: Secondary | ICD-10-CM

## 2012-09-06 DIAGNOSIS — I25810 Atherosclerosis of coronary artery bypass graft(s) without angina pectoris: Secondary | ICD-10-CM

## 2012-09-06 NOTE — Progress Notes (Signed)
HPI:  The patient is in for followup. From a clinical standpoint he has been stable from the standpoint of chest pain. However he is gotten some headaches at night. He is also got some cramps. Since he started drinking a beer every single night, he is well going away. The patient has no sleep apnea, but never got around going and getting his device. He will drink one red at daily. He also notes his joints seem to be bothering him a lot. He thought the blood pressure was up when he had the headache, and he took an extra blood pressure pill and improved. However, his blood pressure today in the clinic is normal.  Current Outpatient Prescriptions  Medication Sig Dispense Refill  . albuterol-ipratropium (COMBIVENT) 18-103 MCG/ACT inhaler Inhale 2 puffs into the lungs daily.        . Ascorbic Acid (VITAMIN C PO) Take by mouth daily.      Marland Kitchen aspirin 81 MG tablet Take 81 mg by mouth daily.        . Cholecalciferol (VITAMIN D PO) Take by mouth.      . cyclobenzaprine (FLEXERIL) 10 MG tablet Take 10 mg by mouth 2 (two) times daily as needed.        . loratadine (CLARITIN) 10 MG tablet Take 10 mg by mouth daily as needed.        Marland Kitchen MAGNESIUM PO Take by mouth 2 (two) times daily.       . metoprolol (LOPRESSOR) 25 MG tablet Take 1 tablet (25 mg total) by mouth 2 (two) times daily.  60 tablet  11  . omeprazole (PRILOSEC) 20 MG capsule Take 20 mg by mouth 2 (two) times daily as needed.        Marland Kitchen oxyCODONE-acetaminophen (PERCOCET) 5-325 MG per tablet Take 1 tablet by mouth every 4 (four) hours as needed.        Marland Kitchen oxymetazoline (AFRIN) 0.05 % nasal spray as needed.        . potassium chloride SA (K-DUR,KLOR-CON) 20 MEQ tablet Take 20 mEq by mouth as needed.        . pravastatin (PRAVACHOL) 40 MG tablet Take 1 tablet (40 mg total) by mouth at bedtime.  30 tablet  3  . tiotropium (SPIRIVA HANDIHALER) 18 MCG inhalation capsule Take 2 puffs 2 times daily         Allergies  Allergen Reactions  . Amlodipine  Besylate     REACTION: tongue and facial swelling, difficulty breathing  . Sulfonamide Derivatives     Past Medical History  Diagnosis Date  . Myocardial infarction     acute  . Hypertension   . Back pain, chronic   . Arthritis     Past Surgical History  Procedure Date  . Cardiac catheterization     bare-metal stent to left anterior descending    No family history on file.  History   Social History  . Marital Status: Legally Separated    Spouse Name: N/A    Number of Children: N/A  . Years of Education: N/A   Occupational History  . Self employed    Social History Main Topics  . Smoking status: Former Games developer  . Smokeless tobacco: Not on file  . Alcohol Use: Not on file  . Drug Use: Not on file  . Sexually Active: Not on file   Other Topics Concern  . Not on file   Social History Narrative   Patient lives in [place].  ROS: Please see the HPI.  All other systems reviewed and negative.  PHYSICAL EXAM:  BP 118/78  Pulse 48  Ht 6' (1.829 m)  Wt 250 lb (113.399 kg)  BMI 33.91 kg/m2  General: Well developed, well nourished, in no acute distress. Head:  Normocephalic and atraumatic. Neck: no JVD Lungs: Clear to auscultation and percussion. Heart: Normal S1 and S2.  No murmur, rubs or gallops.  Pulses: Pulses normal in all 4 extremities. Extremities: No clubbing or cyanosis. No edema. Neurologic: Alert and oriented x 3.  EKG:  SB otherwise normal.  ASSESSMENT AND PLAN:

## 2012-09-06 NOTE — Assessment & Plan Note (Signed)
Patient's blood pressure today is completely normal. However, he thinks it was elevated when he has headache. However, he is going through 2 blood pressure machines already, with both of them disappearing for different reasons. He will go out and get another machine, and he will bring his recordings in to see me in 6 weeks when he returns.

## 2012-09-06 NOTE — Assessment & Plan Note (Signed)
The patient says his joints bother him all the time at this point. As a result of that, we are going to take a six-week pravastatin holiday. He will return at the end of 6 weeks to see me, and we will make a decision regarding long-term statin therapy.

## 2012-09-06 NOTE — Assessment & Plan Note (Signed)
Patient says his joints are aching. As a result, we will give him a brief holiday, I will see him back in followup in 6 weeks which makes long-term decisions.

## 2012-09-06 NOTE — Assessment & Plan Note (Signed)
He's not having any recurrent cardiac related chest pain

## 2012-09-06 NOTE — Patient Instructions (Addendum)
Your physician has recommended you make the following change in your medication: STOP PRAVASTATIN    Your physician recommends that you schedule a follow-up appointment WITH DR. Riley Kill IN 6 WEEKS   Get blood pressure cuff and keep recording of blood pressures everyday, bring reading when you come back to see Dr. Riley Kill

## 2012-09-08 ENCOUNTER — Ambulatory Visit: Payer: Medicaid Other | Admitting: Cardiology

## 2012-10-06 ENCOUNTER — Encounter: Payer: Self-pay | Admitting: Cardiology

## 2012-10-06 ENCOUNTER — Ambulatory Visit (INDEPENDENT_AMBULATORY_CARE_PROVIDER_SITE_OTHER): Payer: Medicaid Other | Admitting: Cardiology

## 2012-10-06 VITALS — BP 133/80 | HR 68 | Resp 20 | Ht 72.0 in | Wt 256.0 lb

## 2012-10-06 DIAGNOSIS — I10 Essential (primary) hypertension: Secondary | ICD-10-CM

## 2012-10-06 DIAGNOSIS — I2510 Atherosclerotic heart disease of native coronary artery without angina pectoris: Secondary | ICD-10-CM

## 2012-10-06 DIAGNOSIS — E7800 Pure hypercholesterolemia, unspecified: Secondary | ICD-10-CM

## 2012-10-06 MED ORDER — SIMVASTATIN 10 MG PO TABS: 10.0000 mg | ORAL_TABLET | ORAL | Status: DC

## 2012-10-06 NOTE — Assessment & Plan Note (Signed)
Patient has not had recurrent angina. 

## 2012-10-06 NOTE — Assessment & Plan Note (Signed)
Reviewed his pressures, and there are relatively well controlled.

## 2012-10-06 NOTE — Patient Instructions (Addendum)
Your physician has recommended you make the following change in your medication: START Simvastatin 10mg  take one by mouth every other day  Your physician recommends that you return for a FASTING LIPID and LIVER in 6 WEEKS--nothing to eat or drink after midnight, order given to the patient--do not have labs drawn if you stop Simvastatin  Your physician recommends that you schedule a follow-up appointment in: 2 MONTHS

## 2012-10-06 NOTE — Assessment & Plan Note (Signed)
He was placed on a protocol holiday, and the symptoms improved dramatically. He has tried red yeast rice, and I have problems. We talked about alternative strategies, and he'll start simvastatin 10 mg every other day or 3 times a week, and we will check a lipid liver profile in about 2 months she started to hurt again, he understands to quit the drug.

## 2012-10-06 NOTE — Progress Notes (Signed)
HPI:  The patient is seen today in a followup visit. He went on Pravachol holiday.  He felt better within 3 days. He's not had any chest pain. He is in his usual good mood today. No current chest pain  Current Outpatient Prescriptions  Medication Sig Dispense Refill  . albuterol-ipratropium (COMBIVENT) 18-103 MCG/ACT inhaler Inhale 2 puffs into the lungs daily.        . Ascorbic Acid (VITAMIN C PO) Take by mouth daily.      Marland Kitchen aspirin 81 MG tablet Take 81 mg by mouth daily.        . Cholecalciferol (VITAMIN D PO) Take by mouth.      . cyclobenzaprine (FLEXERIL) 10 MG tablet Take 10 mg by mouth 2 (two) times daily as needed.        . loratadine (CLARITIN) 10 MG tablet Take 10 mg by mouth daily as needed.        Marland Kitchen MAGNESIUM PO Take by mouth 2 (two) times daily.       . metoprolol (LOPRESSOR) 25 MG tablet Take 1 tablet (25 mg total) by mouth 2 (two) times daily.  60 tablet  11  . omeprazole (PRILOSEC) 20 MG capsule Take 20 mg by mouth 2 (two) times daily as needed.        Marland Kitchen oxyCODONE-acetaminophen (PERCOCET) 5-325 MG per tablet Take 1 tablet by mouth every 4 (four) hours as needed.        Marland Kitchen oxymetazoline (AFRIN) 0.05 % nasal spray as needed.        . potassium chloride SA (K-DUR,KLOR-CON) 20 MEQ tablet Take 20 mEq by mouth as needed.        . tiotropium (SPIRIVA HANDIHALER) 18 MCG inhalation capsule Take 2 puffs 2 times daily         Allergies  Allergen Reactions  . Amlodipine Besylate     REACTION: tongue and facial swelling, difficulty breathing  . Sulfonamide Derivatives     Past Medical History  Diagnosis Date  . Myocardial infarction     acute  . Hypertension   . Back pain, chronic   . Arthritis     Past Surgical History  Procedure Date  . Cardiac catheterization     bare-metal stent to left anterior descending    No family history on file.  History   Social History  . Marital Status: Legally Separated    Spouse Name: N/A    Number of Children: N/A  . Years of  Education: N/A   Occupational History  . Self employed    Social History Main Topics  . Smoking status: Former Games developer  . Smokeless tobacco: Not on file  . Alcohol Use: Not on file  . Drug Use: Not on file  . Sexually Active: Not on file   Other Topics Concern  . Not on file   Social History Narrative   Patient lives in [place].    ROS: Please see the HPI.  All other systems reviewed and negative.  PHYSICAL EXAM:  BP 133/80  Pulse 68  Resp 20  Ht 6' (1.829 m)  Wt 256 lb (116.121 kg)  BMI 34.72 kg/m2  SpO2 93%  General: Well developed, well nourished, in no acute distress. Head:  Normocephalic and atraumatic. Neck: no JVD Lungs: Clear to auscultation and percussion. Heart: Normal S1 and S2.  No murmur, rubs or gallops.  Pulses: Pulses normal in all 4 extremities. Extremities: No clubbing or cyanosis. No edema. Neurologic: Alert and oriented  x 3.  EKG:  NSR.  Delay in R wave progression.  Otherwise ok.    ASSESSMENT AND PLAN:

## 2012-12-06 ENCOUNTER — Encounter: Payer: Self-pay | Admitting: Cardiology

## 2012-12-06 ENCOUNTER — Other Ambulatory Visit: Payer: Self-pay | Admitting: *Deleted

## 2012-12-06 ENCOUNTER — Ambulatory Visit (INDEPENDENT_AMBULATORY_CARE_PROVIDER_SITE_OTHER): Payer: Medicaid Other | Admitting: Cardiology

## 2012-12-06 VITALS — BP 116/84 | HR 53 | Ht 72.0 in | Wt 263.0 lb

## 2012-12-06 DIAGNOSIS — I10 Essential (primary) hypertension: Secondary | ICD-10-CM

## 2012-12-06 DIAGNOSIS — E7800 Pure hypercholesterolemia, unspecified: Secondary | ICD-10-CM

## 2012-12-06 DIAGNOSIS — E782 Mixed hyperlipidemia: Secondary | ICD-10-CM

## 2012-12-06 DIAGNOSIS — I2510 Atherosclerotic heart disease of native coronary artery without angina pectoris: Secondary | ICD-10-CM

## 2012-12-06 MED ORDER — EZETIMIBE 10 MG PO TABS: 10.0000 mg | ORAL_TABLET | Freq: Every day | ORAL | Status: DC

## 2012-12-06 NOTE — Assessment & Plan Note (Signed)
The patient has not had recurrent symptoms.

## 2012-12-06 NOTE — Progress Notes (Signed)
HPI:  Patient seen today for followup visit. From a cardiac standpoint he remains very stable. He denies any chest pain. He does note that he started back on simvastatin after getting his lipids recently, and when he did this, he continued to have problems with his legs. As a result he stopped this. We had a  long discussion about his lipid status today. LDL off of therapy was just over 100.  Current Outpatient Prescriptions  Medication Sig Dispense Refill  . albuterol-ipratropium (COMBIVENT) 18-103 MCG/ACT inhaler Inhale 2 puffs into the lungs daily.        . Ascorbic Acid (VITAMIN C PO) Take by mouth daily.      Marland Kitchen aspirin 81 MG tablet Take 81 mg by mouth daily.        . Cholecalciferol (VITAMIN D PO) Take by mouth.      . cyclobenzaprine (FLEXERIL) 10 MG tablet Take 10 mg by mouth 2 (two) times daily as needed.        . loratadine (CLARITIN) 10 MG tablet Take 10 mg by mouth daily as needed.        Marland Kitchen MAGNESIUM PO Take by mouth 2 (two) times daily.       . metoprolol (LOPRESSOR) 25 MG tablet Take 1 tablet (25 mg total) by mouth 2 (two) times daily.  60 tablet  11  . omeprazole (PRILOSEC) 20 MG capsule Take 20 mg by mouth 2 (two) times daily as needed.        Marland Kitchen oxyCODONE-acetaminophen (PERCOCET) 5-325 MG per tablet Take 1 tablet by mouth every 4 (four) hours as needed.        Marland Kitchen oxymetazoline (AFRIN) 0.05 % nasal spray as needed.        . potassium chloride SA (K-DUR,KLOR-CON) 20 MEQ tablet Take 20 mEq by mouth as needed.        . simvastatin (ZOCOR) 10 MG tablet Take 1 tablet (10 mg total) by mouth every other day.  30 tablet  6  . tiotropium (SPIRIVA HANDIHALER) 18 MCG inhalation capsule Take 2 puffs 2 times daily         Allergies  Allergen Reactions  . Amlodipine Besylate     REACTION: tongue and facial swelling, difficulty breathing  . Sulfonamide Derivatives     Past Medical History  Diagnosis Date  . Myocardial infarction     acute  . Hypertension   . Back pain, chronic   .  Arthritis     Past Surgical History  Procedure Date  . Cardiac catheterization     bare-metal stent to left anterior descending    No family history on file.  History   Social History  . Marital Status: Legally Separated    Spouse Name: N/A    Number of Children: N/A  . Years of Education: N/A   Occupational History  . Self employed    Social History Main Topics  . Smoking status: Former Games developer  . Smokeless tobacco: Not on file  . Alcohol Use: Not on file  . Drug Use: Not on file  . Sexually Active: Not on file   Other Topics Concern  . Not on file   Social History Narrative   Patient lives in [place].    ROS: Please see the HPI.  All other systems reviewed and negative.  PHYSICAL EXAM:  BP 116/84  Pulse 53  Ht 6' (1.829 m)  Wt 263 lb (119.296 kg)  BMI 35.67 kg/m2  SpO2 95%  General:  Well developed, well nourished, in no acute distress. Head:  Normocephalic and atraumatic. Neck: no JVD Lungs: Clear to auscultation and percussion. Heart: Normal S1 and S2.  No murmur, rubs or gallops.  Pulses: Pulses normal in all 4 extremities. Extremities: No clubbing or cyanosis. No edema. Neurologic: Alert and oriented x 3.  EKG:  SB.  Otherwise normal    ASSESSMENT AND PLAN:

## 2012-12-06 NOTE — Assessment & Plan Note (Addendum)
The patient continues to have problems when he takes statins. We talked about various options, and we will start Zetia 10 mg daily check a cholesterol profile in approximately 6 weeks. Notably, I made very clear to him that there is lacking of data on outcomes.

## 2012-12-06 NOTE — Assessment & Plan Note (Addendum)
.   under better control.

## 2012-12-06 NOTE — Patient Instructions (Addendum)
Your physician has recommended you make the following change in your medication: START Zetia 10mg  take one by mouth daily  Your physician recommends that you return for a FASTING lipid profile in 6 WEEKS--nothing to eat or drink after midnight, lab opens at 7:30  Your physician recommends that you schedule a follow-up appointment in: [facility] 2014

## 2012-12-07 ENCOUNTER — Other Ambulatory Visit: Payer: Self-pay

## 2012-12-07 MED ORDER — EZETIMIBE 10 MG PO TABS: 10.0000 mg | ORAL_TABLET | Freq: Every day | ORAL | Status: DC

## 2012-12-09 ENCOUNTER — Other Ambulatory Visit: Payer: Self-pay | Admitting: *Deleted

## 2012-12-16 ENCOUNTER — Other Ambulatory Visit: Payer: Self-pay | Admitting: *Deleted

## 2012-12-16 NOTE — Telephone Encounter (Signed)
Called [place] [employer] for PRIOR AUTHORIZATION for ezetimibe (ZETIA) 10 MG tablet - take 1 tablet by mouth daily.  Spoke with Chad informed her the pt has tried taking Crestor 40 MG 1-qd, Pravastatin 40 MG 1-qd, Simvastatin 10 MG 1-qd before starting ezetimibe (ZETIA) 10 MG tablet.  Per Adaline Sill ezetimibe (ZETIA) 10 MG tablet was approved for 1 year, [employer]  #[id number] P.  Caralee Ates, CMA    Called [facility] [place] spoke with Natalia Leatherwood informed her the PRIOR AUTHORIZATION for ezetimibe (ZETIA) 10 MG tablet - take 1 tablet by mouth daily QTY: 90 with 3 refills had been approved.  Caralee Ates, CMA

## 2013-01-17 ENCOUNTER — Other Ambulatory Visit (INDEPENDENT_AMBULATORY_CARE_PROVIDER_SITE_OTHER): Payer: Medicaid Other

## 2013-01-17 DIAGNOSIS — I2510 Atherosclerotic heart disease of native coronary artery without angina pectoris: Secondary | ICD-10-CM

## 2013-01-17 DIAGNOSIS — E782 Mixed hyperlipidemia: Secondary | ICD-10-CM

## 2013-01-17 DIAGNOSIS — I10 Essential (primary) hypertension: Secondary | ICD-10-CM

## 2013-01-17 LAB — LIPID PANEL
Cholesterol: 147 mg/dL (ref 0–200)
Triglycerides: 120 mg/dL (ref 0.0–149.0)
VLDL: 24 mg/dL (ref 0.0–40.0)

## 2013-02-08 ENCOUNTER — Other Ambulatory Visit: Payer: Self-pay

## 2013-03-01 ENCOUNTER — Ambulatory Visit: Payer: Medicaid Other | Admitting: Cardiology

## 2013-03-30 ENCOUNTER — Encounter: Payer: Self-pay | Admitting: Cardiology

## 2013-03-30 ENCOUNTER — Ambulatory Visit (INDEPENDENT_AMBULATORY_CARE_PROVIDER_SITE_OTHER): Payer: Medicaid Other | Admitting: Cardiology

## 2013-03-30 VITALS — BP 142/86 | HR 50 | Ht 72.0 in | Wt 268.0 lb

## 2013-03-30 DIAGNOSIS — E7800 Pure hypercholesterolemia, unspecified: Secondary | ICD-10-CM

## 2013-03-30 DIAGNOSIS — I10 Essential (primary) hypertension: Secondary | ICD-10-CM

## 2013-03-30 DIAGNOSIS — I2510 Atherosclerotic heart disease of native coronary artery without angina pectoris: Secondary | ICD-10-CM

## 2013-03-30 NOTE — Progress Notes (Signed)
HPI:  This nice patient is doing well. He has had no chest pain. He fell, and bruised the inner aspect of his left arm, but he is able to move his fingers and his pulses are intact. Importantly, the patient also had 2 of his toes removed.  Zetia caused him to lose his vision transiently.  So he stopped, tried it again, and it came back.  Now stable.     Current Outpatient Prescriptions  Medication Sig Dispense Refill  . albuterol-ipratropium (COMBIVENT) 18-103 MCG/ACT inhaler Inhale 2 puffs into the lungs daily.        . Ascorbic Acid (VITAMIN C PO) Take by mouth daily.      Marland Kitchen aspirin 81 MG tablet Take 81 mg by mouth daily.        . Cholecalciferol (VITAMIN D PO) Take by mouth.      . cyclobenzaprine (FLEXERIL) 10 MG tablet Take 10 mg by mouth 2 (two) times daily as needed.        . diazepam (VALIUM) 10 MG tablet Take 10 mg by mouth every 6 (six) hours as needed for anxiety.      Marland Kitchen loratadine (CLARITIN) 10 MG tablet Take 10 mg by mouth daily as needed.        Marland Kitchen MAGNESIUM PO Take by mouth 2 (two) times daily.       . metoprolol (LOPRESSOR) 25 MG tablet Take 1 tablet (25 mg total) by mouth 2 (two) times daily.  60 tablet  11  . omeprazole (PRILOSEC) 20 MG capsule Take 20 mg by mouth 2 (two) times daily as needed.        Marland Kitchen oxyCODONE-acetaminophen (PERCOCET) 5-325 MG per tablet Take 1 tablet by mouth every 4 (four) hours as needed.        Marland Kitchen oxymetazoline (AFRIN) 0.05 % nasal spray as needed.        . potassium chloride SA (K-DUR,KLOR-CON) 20 MEQ tablet Take 20 mEq by mouth as needed.        . tiotropium (SPIRIVA HANDIHALER) 18 MCG inhalation capsule Take 2 puffs 2 times daily        No current facility-administered medications for this visit.    Allergies  Allergen Reactions  . Amlodipine Besylate     REACTION: tongue and facial swelling, difficulty breathing  . Sulfonamide Derivatives     Past Medical History  Diagnosis Date  . Myocardial infarction     acute  . Hypertension   .  Back pain, chronic   . Arthritis     Past Surgical History  Procedure Laterality Date  . Cardiac catheterization      bare-metal stent to left anterior descending    No family history on file.  History   Social History  . Marital Status: Legally Separated    Spouse Name: N/A    Number of Children: N/A  . Years of Education: N/A   Occupational History  . Self employed    Social History Main Topics  . Smoking status: Former Games developer  . Smokeless tobacco: Not on file  . Alcohol Use: Not on file  . Drug Use: Not on file  . Sexually Active: Not on file   Other Topics Concern  . Not on file   Social History Narrative   Patient lives in [place].    ROS: Please see the HPI.  All other systems reviewed and negative.  PHYSICAL EXAM:  BP 142/86  Pulse 50  Ht 6' (1.829 m)  [employer]  268 lb (121.564 kg)  BMI 36.34 kg/m2  SpO2 97%  General: Well developed, well nourished, in no acute distress. Head:  Normocephalic and atraumatic. Neck: no JVD Lungs: Clear to auscultation and percussion. Heart: Normal S1 and S2.  No murmur, rubs or gallops.  Abdomen:  Normal bowel sounds; soft; non tender; no organomegaly Pulses: Pulses normal in all 4 extremities. Extremities: bruising of the ventral aspect of the arm.  Pulses intact.  Skin loose.  No evidence of compartment compression. Neurologic: Alert and oriented x 3.  EKG:  SB.  Otherwise normal.    ASSESSMENT AND PLAN:  1.  RTC in six months 2.  FU Dr. Clifton Davaris 3.  Discussed options.

## 2013-03-30 NOTE — Patient Instructions (Addendum)
Follow-up with Dr. Earney Hamburg  Continue taking your medications as prescribed

## 2013-04-03 NOTE — Assessment & Plan Note (Signed)
Currently stable with no active symptoms at this point in time.

## 2013-04-03 NOTE — Assessment & Plan Note (Signed)
Borderline control. 

## 2013-04-03 NOTE — Assessment & Plan Note (Signed)
Tremendous difficulty with medications.  Continue current approach.

## 2013-07-15 ENCOUNTER — Other Ambulatory Visit: Payer: Self-pay | Admitting: Cardiology

## 2013-09-22 ENCOUNTER — Ambulatory Visit (INDEPENDENT_AMBULATORY_CARE_PROVIDER_SITE_OTHER): Payer: Medicaid Other | Admitting: Cardiovascular Disease

## 2013-09-22 ENCOUNTER — Encounter: Payer: Self-pay | Admitting: Cardiovascular Disease

## 2013-09-22 VITALS — BP 142/84 | HR 52 | Ht 72.0 in | Wt 274.0 lb

## 2013-09-22 DIAGNOSIS — I10 Essential (primary) hypertension: Secondary | ICD-10-CM

## 2013-09-22 DIAGNOSIS — I2510 Atherosclerotic heart disease of native coronary artery without angina pectoris: Secondary | ICD-10-CM

## 2013-09-22 NOTE — Patient Instructions (Addendum)
Your physician wants you to follow-up in:  12 months.  You will receive a reminder letter in the mail two months in advance. If you don't receive a letter, please call our office to schedule the follow-up appointment.   

## 2013-09-22 NOTE — Progress Notes (Signed)
History of Present Illness: 67 yo male with history of CAD, HTN, COPD here today for cardiac follow up. He has been followed in the past by Dr. Riley Kill. Last cath March 2010 with severe LAD stenosis, 50% mid RCA, mild plaque Circumflex. A 3.0 x 20 mm Veriflex bare metal stent was placed in the mid LAD. Last echo April 2010 with normal LV function and no valve disease. He is intolerant of statins and Zetia.   He is here today for follow up. No chest pain or SOB. 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   Primary Care Physician: Mickle Plumb  Last Lipid Profile:Lipid Panel     Component Value Date/Time   CHOL 147 01/17/2013 0917   TRIG 120.0 01/17/2013 0917   HDL 31.80* 01/17/2013 0917   CHOLHDL 5 01/17/2013 0917   VLDL 24.0 01/17/2013 0917   LDLCALC 91 01/17/2013 0917     Past Medical History  Diagnosis Date  . Myocardial infarction     acute  . Hypertension   . Back pain, chronic   . Arthritis     Past Surgical History  Procedure Laterality Date  . Cardiac catheterization      bare-metal stent to left anterior descending  . Skin grafting left leg    . Hand surgery      Current Outpatient Prescriptions  Medication Sig Dispense Refill  . albuterol-ipratropium (COMBIVENT) 18-103 MCG/ACT inhaler Inhale 2 puffs into the lungs daily.        . Ascorbic Acid (VITAMIN C PO) Take by mouth daily.      Marland Kitchen aspirin 81 MG tablet Take 81 mg by mouth daily.        . Cholecalciferol (VITAMIN D PO) Take by mouth.      . cyclobenzaprine (FLEXERIL) 10 MG tablet Take 10 mg by mouth 2 (two) times daily as needed.        . diazepam (VALIUM) 10 MG tablet Take 10 mg by mouth every 6 (six) hours as needed for anxiety.      Marland Kitchen loratadine (CLARITIN) 10 MG tablet Take 10 mg by mouth daily as needed.        Marland Kitchen MAGNESIUM PO Take by mouth 2 (two) times daily.       . metoprolol tartrate (LOPRESSOR) 25 MG tablet TAKE 1 TABLET (25 MG TOTAL) BY MOUTH 2 (TWO) TIMES DAILY.  60 tablet  5  . omeprazole  (PRILOSEC) 20 MG capsule Take 20 mg by mouth 2 (two) times daily as needed.        Marland Kitchen oxyCODONE-acetaminophen (PERCOCET) 5-325 MG per tablet Take 1 tablet by mouth every 4 (four) hours as needed.        Marland Kitchen oxymetazoline (AFRIN) 0.05 % nasal spray as needed.        . potassium chloride SA (K-DUR,KLOR-CON) 20 MEQ tablet Take 20 mEq by mouth as needed.        . tiotropium (SPIRIVA HANDIHALER) 18 MCG inhalation capsule Take 2 puffs 2 times daily        No current facility-administered medications for this visit.    Allergies  Allergen Reactions  . Amlodipine Besylate     REACTION: tongue and facial swelling, difficulty breathing  . Sulfonamide Derivatives     History   Social History  . Marital Status: Legally Separated    Spouse Name: N/A    Number of Children: 15  . Years of Education: N/A   Occupational History  . Self employed-Farmer  Social History Main Topics  . Smoking status: Former Smoker -- 1.00 packs/day for 25 years    Types: Cigarettes    Quit date: 09/23/1991  . Smokeless tobacco: Not on file  . Alcohol Use: 0.5 oz/week    1 drink(s) per week  . Drug Use: No  . Sexual Activity: Not on file   Other Topics Concern  . Not on file   Social History Narrative   Patient lives in [place].    Family History  Problem Relation Age of Onset  . CAD Neg Hx     Review of Systems:  As stated in the HPI and otherwise negative.   BP 142/84  Pulse 52  Ht 6' (1.829 m)  Wt 274 lb (124.286 kg)  BMI 37.15 kg/m2  SpO2 98%  Physical Examination: General: Well developed, well nourished, NAD HEENT: OP clear, mucus membranes moist SKIN: warm, dry. No rashes. Neuro: No focal deficits Musculoskeletal: Muscle strength 5/5 all ext Psychiatric: Mood and affect normal Neck: No JVD, no carotid bruits, no thyromegaly, no lymphadenopathy. Lungs:Clear bilaterally, no wheezes, rhonci, crackles Cardiovascular: Regular rate and rhythm. No murmurs, gallops or rubs. Abdomen:Soft.  Bowel sounds present. Non-tender.  Extremities: No lower extremity edema. Pulses are 2 + in the bilateral DP/PT.  Cardiac cath March 2010:  1. Right coronary artery has a mid 50% stenosis consistent with  posterior descending branch.  2. The left main was free of disease.  3. The LAD demonstrates a 90% stenosis, which is in the LAO view very  typical of a ruptured plaque with haziness. This was after the  moderate-size diagonal branch. Following balloon dilatation as  noted, there was loss of the septal perforator, but this opened up  after intracoronary verapamil. There was minimal pinching of the  diagonal branch with stenting, but there was excellent flow into  the diagonal. The margins of the stent looked good following  dilatation without evidence of malposition angiographically, and  with no evidence of edge tear. There was excellent runoff into the  distal vessel.  4. The circumflex is a large-caliber vessel with mild irregularity in  the first marginal.  5. The ventriculogram demonstrates ejection fraction estimate of 45%  with some apical dyskinesis.  Echo April 2010:  Overall left ventricular systolic function was normal. Left ventricular ejection fraction was estimated , range being 55 % to 60 %. There was akinesis of the septal wall. There was possible hypokinesis of the inferior wall. Anterior wall not seen well but appears to move well. Left ventricular wall thickness was at the upper limits of normal.  Assessment and Plan:   1. CAD: Stable. Continue ASA and metoprolol. He does not tolerate statins or Zetia.   2. HTN: BP controlled. No changes in meds.

## 2013-11-15 ENCOUNTER — Telehealth: Payer: Self-pay | Admitting: Cardiovascular Disease

## 2013-11-15 NOTE — Telephone Encounter (Signed)
Pt called because he states Dr. Riley Kill gave him a card stating the kind of stents he had done and dates. Pt was made aware that those cards are giving in the hospital when he was had the stent placement. Pt states he has been carrying that card in his packet and thinks that is neccessary for him to have it. He is aware that I will sent this message to MD's nurse, she might  knows where to get them.

## 2013-11-15 NOTE — Telephone Encounter (Signed)
New message     Pt want to know if he can get another card stating the type of stents he has.  His old card is worn and unreadable.

## 2013-11-16 NOTE — Telephone Encounter (Signed)
These are given in the hospital at time of procedure. Cannot get another one for pt. I placed call to pt to discuss but voicemail is not set up.

## 2013-11-17 NOTE — Telephone Encounter (Signed)
Spoke with pt and told him replacement stent cards are not available.  I reviewed type of stent he had with him. He is going for CT today and asking if OK to have CT and MRI with this stent.  I told him this would be OK.

## 2013-12-09 ENCOUNTER — Other Ambulatory Visit: Payer: Self-pay | Admitting: Cardiology

## 2014-01-19 DIAGNOSIS — M48062 Spinal stenosis, lumbar region with neurogenic claudication: Secondary | ICD-10-CM | POA: Insufficient documentation

## 2014-02-06 DIAGNOSIS — F419 Anxiety disorder, unspecified: Secondary | ICD-10-CM | POA: Insufficient documentation

## 2014-02-06 DIAGNOSIS — R079 Chest pain, unspecified: Secondary | ICD-10-CM | POA: Insufficient documentation

## 2014-02-06 DIAGNOSIS — G8929 Other chronic pain: Secondary | ICD-10-CM | POA: Insufficient documentation

## 2014-04-03 ENCOUNTER — Ambulatory Visit: Payer: Medicare PPO | Attending: Neurology

## 2014-04-03 DIAGNOSIS — M48062 Spinal stenosis, lumbar region with neurogenic claudication: Secondary | ICD-10-CM | POA: Insufficient documentation

## 2014-04-03 DIAGNOSIS — M6281 Muscle weakness (generalized): Secondary | ICD-10-CM | POA: Insufficient documentation

## 2014-04-03 DIAGNOSIS — M5030 Other cervical disc degeneration, unspecified cervical region: Secondary | ICD-10-CM | POA: Diagnosis not present

## 2014-04-03 DIAGNOSIS — IMO0001 Reserved for inherently not codable concepts without codable children: Secondary | ICD-10-CM | POA: Diagnosis not present

## 2014-04-03 DIAGNOSIS — I10 Essential (primary) hypertension: Secondary | ICD-10-CM | POA: Diagnosis not present

## 2014-04-03 DIAGNOSIS — R293 Abnormal posture: Secondary | ICD-10-CM | POA: Diagnosis not present

## 2014-04-03 DIAGNOSIS — J45909 Unspecified asthma, uncomplicated: Secondary | ICD-10-CM | POA: Insufficient documentation

## 2014-04-06 ENCOUNTER — Ambulatory Visit: Payer: Medicare PPO

## 2014-04-06 DIAGNOSIS — IMO0001 Reserved for inherently not codable concepts without codable children: Secondary | ICD-10-CM | POA: Diagnosis not present

## 2014-04-11 ENCOUNTER — Encounter: Payer: Commercial Managed Care - HMO | Admitting: *Deleted

## 2014-04-12 ENCOUNTER — Ambulatory Visit: Payer: Medicare PPO | Admitting: Physical Therapy

## 2014-04-12 DIAGNOSIS — IMO0001 Reserved for inherently not codable concepts without codable children: Secondary | ICD-10-CM | POA: Diagnosis not present

## 2014-04-13 ENCOUNTER — Ambulatory Visit: Payer: Medicare PPO | Admitting: *Deleted

## 2014-04-13 DIAGNOSIS — IMO0001 Reserved for inherently not codable concepts without codable children: Secondary | ICD-10-CM | POA: Diagnosis not present

## 2014-04-18 ENCOUNTER — Ambulatory Visit: Payer: Medicare PPO | Admitting: Physical Therapy

## 2014-04-18 DIAGNOSIS — IMO0001 Reserved for inherently not codable concepts without codable children: Secondary | ICD-10-CM | POA: Diagnosis not present

## 2014-04-20 ENCOUNTER — Ambulatory Visit: Payer: Medicare PPO | Admitting: Physical Therapy

## 2014-04-20 DIAGNOSIS — IMO0001 Reserved for inherently not codable concepts without codable children: Secondary | ICD-10-CM | POA: Diagnosis not present

## 2014-04-25 ENCOUNTER — Ambulatory Visit: Payer: Medicare PPO | Admitting: Physical Therapy

## 2014-04-25 DIAGNOSIS — IMO0001 Reserved for inherently not codable concepts without codable children: Secondary | ICD-10-CM | POA: Diagnosis not present

## 2014-04-27 ENCOUNTER — Encounter: Payer: Commercial Managed Care - HMO | Admitting: Physical Therapy

## 2014-05-02 ENCOUNTER — Ambulatory Visit: Payer: Medicare PPO | Attending: Neurology | Admitting: Physical Therapy

## 2014-05-02 DIAGNOSIS — I10 Essential (primary) hypertension: Secondary | ICD-10-CM | POA: Diagnosis not present

## 2014-05-02 DIAGNOSIS — M48062 Spinal stenosis, lumbar region with neurogenic claudication: Secondary | ICD-10-CM | POA: Diagnosis not present

## 2014-05-02 DIAGNOSIS — M5030 Other cervical disc degeneration, unspecified cervical region: Secondary | ICD-10-CM | POA: Diagnosis not present

## 2014-05-02 DIAGNOSIS — R293 Abnormal posture: Secondary | ICD-10-CM | POA: Insufficient documentation

## 2014-05-02 DIAGNOSIS — M6281 Muscle weakness (generalized): Secondary | ICD-10-CM | POA: Insufficient documentation

## 2014-05-02 DIAGNOSIS — J45909 Unspecified asthma, uncomplicated: Secondary | ICD-10-CM | POA: Diagnosis not present

## 2014-05-02 DIAGNOSIS — IMO0001 Reserved for inherently not codable concepts without codable children: Secondary | ICD-10-CM | POA: Diagnosis not present

## 2014-05-04 ENCOUNTER — Ambulatory Visit: Payer: Medicare PPO | Admitting: Physical Therapy

## 2014-05-04 DIAGNOSIS — IMO0001 Reserved for inherently not codable concepts without codable children: Secondary | ICD-10-CM | POA: Diagnosis not present

## 2014-05-08 DIAGNOSIS — M545 Low back pain, unspecified: Secondary | ICD-10-CM | POA: Insufficient documentation

## 2014-05-09 ENCOUNTER — Encounter: Payer: Commercial Managed Care - HMO | Admitting: Physical Therapy

## 2014-05-09 DIAGNOSIS — E781 Pure hyperglyceridemia: Secondary | ICD-10-CM | POA: Insufficient documentation

## 2014-05-09 DIAGNOSIS — R269 Unspecified abnormalities of gait and mobility: Secondary | ICD-10-CM | POA: Insufficient documentation

## 2014-05-11 ENCOUNTER — Encounter: Payer: Commercial Managed Care - HMO | Admitting: Physical Therapy

## 2014-05-14 DIAGNOSIS — E611 Iron deficiency: Secondary | ICD-10-CM | POA: Insufficient documentation

## 2014-08-17 DIAGNOSIS — F1110 Opioid abuse, uncomplicated: Secondary | ICD-10-CM | POA: Insufficient documentation

## 2014-08-17 DIAGNOSIS — M792 Neuralgia and neuritis, unspecified: Secondary | ICD-10-CM | POA: Insufficient documentation

## 2014-08-17 DIAGNOSIS — Z79891 Long term (current) use of opiate analgesic: Secondary | ICD-10-CM | POA: Insufficient documentation

## 2014-09-18 ENCOUNTER — Encounter: Payer: Self-pay | Admitting: Cardiovascular Disease

## 2014-09-18 ENCOUNTER — Ambulatory Visit (INDEPENDENT_AMBULATORY_CARE_PROVIDER_SITE_OTHER): Payer: Medicare PPO | Admitting: Cardiovascular Disease

## 2014-09-18 VITALS — BP 130/86 | HR 54 | Ht 71.0 in | Wt 279.0 lb

## 2014-09-18 DIAGNOSIS — I10 Essential (primary) hypertension: Secondary | ICD-10-CM

## 2014-09-18 DIAGNOSIS — I2510 Atherosclerotic heart disease of native coronary artery without angina pectoris: Secondary | ICD-10-CM | POA: Diagnosis not present

## 2014-09-18 NOTE — Patient Instructions (Signed)
Your physician wants you to follow-up in:  12 months.  You will receive a reminder letter in the mail two months in advance. If you don't receive a letter, please call our office to schedule the follow-up appointment.   
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
## 2015-01-25 ENCOUNTER — Other Ambulatory Visit: Payer: Self-pay | Admitting: *Deleted

## 2015-01-25 MED ORDER — METOPROLOL TARTRATE 25 MG PO TABS: ORAL_TABLET | ORAL | Status: DC

## 2015-07-20 ENCOUNTER — Encounter (HOSPITAL_COMMUNITY): Payer: Self-pay | Admitting: *Deleted

## 2015-07-20 ENCOUNTER — Emergency Department (HOSPITAL_COMMUNITY): Payer: Medicare PPO

## 2015-07-20 ENCOUNTER — Emergency Department (HOSPITAL_COMMUNITY)
Admission: EM | Admit: 2015-07-20 | Discharge: 2015-07-20 | Disposition: A | Discharge status: Home / Self Care | Payer: Medicare PPO | Attending: Emergency Medicine | Admitting: Emergency Medicine

## 2015-07-20 DIAGNOSIS — I252 Old myocardial infarction: Secondary | ICD-10-CM | POA: Insufficient documentation

## 2015-07-20 DIAGNOSIS — I10 Essential (primary) hypertension: Secondary | ICD-10-CM | POA: Diagnosis not present

## 2015-07-20 DIAGNOSIS — I2510 Atherosclerotic heart disease of native coronary artery without angina pectoris: Secondary | ICD-10-CM | POA: Insufficient documentation

## 2015-07-20 DIAGNOSIS — Y9289 Other specified places as the place of occurrence of the external cause: Secondary | ICD-10-CM | POA: Diagnosis not present

## 2015-07-20 DIAGNOSIS — Z7951 Long term (current) use of inhaled steroids: Secondary | ICD-10-CM | POA: Diagnosis not present

## 2015-07-20 DIAGNOSIS — Y9389 Activity, other specified: Secondary | ICD-10-CM | POA: Insufficient documentation

## 2015-07-20 DIAGNOSIS — Z8669 Personal history of other diseases of the nervous system and sense organs: Secondary | ICD-10-CM | POA: Diagnosis not present

## 2015-07-20 DIAGNOSIS — M549 Dorsalgia, unspecified: Secondary | ICD-10-CM | POA: Insufficient documentation

## 2015-07-20 DIAGNOSIS — G8929 Other chronic pain: Secondary | ICD-10-CM | POA: Diagnosis not present

## 2015-07-20 DIAGNOSIS — Z9889 Other specified postprocedural states: Secondary | ICD-10-CM | POA: Diagnosis not present

## 2015-07-20 DIAGNOSIS — J449 Chronic obstructive pulmonary disease, unspecified: Secondary | ICD-10-CM | POA: Insufficient documentation

## 2015-07-20 DIAGNOSIS — Z87891 Personal history of nicotine dependence: Secondary | ICD-10-CM | POA: Diagnosis not present

## 2015-07-20 DIAGNOSIS — Z7982 Long term (current) use of aspirin: Secondary | ICD-10-CM | POA: Diagnosis not present

## 2015-07-20 DIAGNOSIS — Z79899 Other long term (current) drug therapy: Secondary | ICD-10-CM | POA: Insufficient documentation

## 2015-07-20 DIAGNOSIS — R079 Chest pain, unspecified: Secondary | ICD-10-CM | POA: Diagnosis not present

## 2015-07-20 DIAGNOSIS — M1990 Unspecified osteoarthritis, unspecified site: Secondary | ICD-10-CM | POA: Insufficient documentation

## 2015-07-20 DIAGNOSIS — M7989 Other specified soft tissue disorders: Secondary | ICD-10-CM

## 2015-07-20 DIAGNOSIS — S8992XA Unspecified injury of left lower leg, initial encounter: Secondary | ICD-10-CM | POA: Insufficient documentation

## 2015-07-20 DIAGNOSIS — Y998 Other external cause status: Secondary | ICD-10-CM | POA: Insufficient documentation

## 2015-07-20 DIAGNOSIS — W228XXA Striking against or struck by other objects, initial encounter: Secondary | ICD-10-CM | POA: Diagnosis not present

## 2015-07-20 HISTORY — DX: Sleep apnea, unspecified: G47.30

## 2015-07-20 LAB — D-DIMER, QUANTITATIVE (NOT AT ARMC): D DIMER QUANT: 0.57 ug{FEU}/mL — AB (ref 0.00–0.48)

## 2015-07-20 LAB — BASIC METABOLIC PANEL
Anion gap: 9 (ref 5–15)
BUN: 21 mg/dL — AB (ref 6–20)
CALCIUM: 8.9 mg/dL (ref 8.9–10.3)
CHLORIDE: 102 mmol/L (ref 101–111)
CO2: 28 mmol/L (ref 22–32)
CREATININE: 1.18 mg/dL (ref 0.61–1.24)
GFR calc non Af Amer: >60 mL/min (ref >60)
GLUCOSE: 96 mg/dL (ref 65–99)
POTASSIUM: 3.8 mmol/L (ref 3.5–5.1)
Sodium: 139 mmol/L (ref 135–145)

## 2015-07-20 MED ORDER — ENOXAPARIN SODIUM 100 MG/ML ~~LOC~~ SOLN
118.0000 mg | Freq: Once | SUBCUTANEOUS | Status: AC
  Administered 2015-07-20: 120 mg via SUBCUTANEOUS
  Filled 2015-07-20: qty 2

## 2015-07-20 MED ORDER — IOHEXOL 350 MG/ML SOLN
100.0000 mL | Freq: Once | INTRAVENOUS | Status: AC | PRN
  Administered 2015-07-20: 100 mL via INTRAVENOUS
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
## 2015-07-20 NOTE — Discharge Instructions (Signed)
Your CT scan is negative for pulmonary embolism. There are however some lymph nodes present in the upper chest. These were present in 2010, and only minimally enlarged from that time. Please have your physician to monitor this with you closely. Please return to the radiology department on tomorrow at 945 for ultrasound of your lower extremity. Please come to the emergency department lobby to wait for your results. Please notify the registration staff that she will are waiting for ultrasound results. Please keep your legs elevated above your waist is much as possible.

## 2015-07-20 NOTE — ED Notes (Signed)
Pt c/o left leg pain. Pt states he slipped and hit a piece of metal. Pt has a skin graft to the left leg as well.

## 2015-07-21 ENCOUNTER — Ambulatory Visit (HOSPITAL_COMMUNITY)
Admit: 2015-07-21 | Discharge: 2015-07-21 | Disposition: A | Discharge status: Home / Self Care | Payer: Medicare PPO | Source: Ambulatory Visit | Attending: Physician Assistant | Admitting: Physician Assistant

## 2015-07-21 ENCOUNTER — Other Ambulatory Visit (HOSPITAL_COMMUNITY): Payer: Self-pay | Admitting: Physician Assistant

## 2015-07-21 DIAGNOSIS — M79605 Pain in left leg: Secondary | ICD-10-CM | POA: Diagnosis not present

## 2015-07-21 DIAGNOSIS — M7989 Other specified soft tissue disorders: Secondary | ICD-10-CM | POA: Diagnosis not present

## 2015-07-21 DIAGNOSIS — M79662 Pain in left lower leg: Secondary | ICD-10-CM

## 2015-07-23 ENCOUNTER — Other Ambulatory Visit (HOSPITAL_COMMUNITY): Payer: Medicare PPO

## 2015-10-22 ENCOUNTER — Ambulatory Visit: Payer: Medicare PPO | Admitting: Cardiovascular Disease

## 2016-02-13 ENCOUNTER — Encounter: Payer: Self-pay | Admitting: Family Medicine

## 2016-02-28 ENCOUNTER — Encounter: Payer: Self-pay | Admitting: Family Medicine

## 2016-02-28 ENCOUNTER — Ambulatory Visit (INDEPENDENT_AMBULATORY_CARE_PROVIDER_SITE_OTHER): Payer: Commercial Managed Care - HMO | Admitting: Family Medicine

## 2016-02-28 VITALS — BP 135/84 | HR 55 | Temp 96.6°F | Ht 72.0 in | Wt 278.2 lb

## 2016-02-28 DIAGNOSIS — I10 Essential (primary) hypertension: Secondary | ICD-10-CM

## 2016-02-28 DIAGNOSIS — J439 Emphysema, unspecified: Secondary | ICD-10-CM | POA: Diagnosis not present

## 2016-02-28 DIAGNOSIS — I2510 Atherosclerotic heart disease of native coronary artery without angina pectoris: Secondary | ICD-10-CM

## 2016-02-28 DIAGNOSIS — E7800 Pure hypercholesterolemia, unspecified: Secondary | ICD-10-CM | POA: Diagnosis not present

## 2016-02-28 DIAGNOSIS — M545 Low back pain, unspecified: Secondary | ICD-10-CM

## 2016-02-28 DIAGNOSIS — J449 Chronic obstructive pulmonary disease, unspecified: Secondary | ICD-10-CM | POA: Insufficient documentation

## 2016-02-28 DIAGNOSIS — R351 Nocturia: Secondary | ICD-10-CM

## 2016-02-28 DIAGNOSIS — K219 Gastro-esophageal reflux disease without esophagitis: Secondary | ICD-10-CM

## 2016-02-28 DIAGNOSIS — Z1159 Encounter for screening for other viral diseases: Secondary | ICD-10-CM | POA: Diagnosis not present

## 2016-02-28 DIAGNOSIS — Z114 Encounter for screening for human immunodeficiency virus [HIV]: Secondary | ICD-10-CM

## 2016-02-28 MED ORDER — CARISOPRODOL 350 MG PO TABS: 350.0000 mg | ORAL_TABLET | Freq: Two times a day (BID) | ORAL | Status: DC | PRN

## 2016-02-28 MED ORDER — POTASSIUM CHLORIDE CRYS ER 20 MEQ PO TBCR: 20.0000 meq | EXTENDED_RELEASE_TABLET | Freq: Every day | ORAL | Status: DC

## 2016-02-28 MED ORDER — PANTOPRAZOLE SODIUM 40 MG PO TBEC: 40.0000 mg | DELAYED_RELEASE_TABLET | Freq: Every day | ORAL | Status: DC

## 2016-02-28 MED ORDER — IPRATROPIUM-ALBUTEROL 18-103 MCG/ACT IN AERO: 2.0000 | INHALATION_SPRAY | Freq: Every day | RESPIRATORY_TRACT | Status: DC

## 2016-02-28 MED ORDER — METOPROLOL TARTRATE 25 MG PO TABS: ORAL_TABLET | ORAL | Status: DC

## 2016-02-28 MED ORDER — BUDESONIDE-FORMOTEROL FUMARATE 160-4.5 MCG/ACT IN AERO: 2.0000 | INHALATION_SPRAY | Freq: Two times a day (BID) | RESPIRATORY_TRACT | Status: DC

## 2016-02-28 NOTE — Progress Notes (Signed)
BP 135/84 mmHg  Pulse 55  Temp(Src) 96.6 F (35.9 C) (Oral)  Ht 6' (1.829 m)  Wt 278 lb 3.2 oz (126.191 kg)  BMI 37.72 kg/m2   Subjective:    Patient ID: Brian Harrington, male    DOB: 31-Oct-1946, 70 y.o.   MRN: [id number]  HPI: Brian Harrington is a 70 y.o. male presenting on 02/28/2016 for Annual Exam and Medication refills   HPI Hypertension Patient comes in today because he is going to establish care with our practice and he also has hypertension. He is currently on metoprolol 25 and his blood pressure today is 135/84. Patient denies headaches, blurred vision, chest pains, or weakness. Denies any side effects from medication and is content with current medication.   CAD Patient has had stents placed because of a myocardial infarction and is currently on a beta blocker and a baby aspirin. He needs a referral for cardiologist here now that he is moved here. Patient is not currently on a statin because he says he has tried multiple in the past and cannot tolerate them. We'll request records for information about which ones he is tried. He does get occasional less than once a month nighttime wheezing spells  GERD Patient has acid reflux for quite a few years and is currently on Protonix 40 and feels like it is doing where well for him. He denies any abdominal pain or overlying symptoms that he has currently.  Hyperlipidemia Patient is coming in today for hyperlipidemia as well. He is due for labs. He is not currently on a statin because his controlled and has been intolerant of multiple statins in the past.  COPD Patient is currently on Symbicort 162 puffs twice a day for his COPD. He also has a Combivent inhaler for rescue inhaler and an albuterol nebulizer. He needs refills on both. Since he has been off the Symbicort he has been having to use the Combivent 3 times a day. See how he does when he gets back on it. He denies any fevers or chills. He has a cough that is nonproductive. He also  has shortness of breath while ambulating farther than a block. He has a 25-pack-year history of smoking and quit in 1992.  Bilateral low back pain Patient has chronic bilateral low back pain and has been on pain medication for management quite some time. He denies any radicular pain going down either leg or numbness or weakness. The pain is mostly centered in his lower back bilaterally. He denies any bowel incontinence or bladder incontinence.  Relevant past medical, surgical, family and social history reviewed and updated as indicated. Interim medical history since our last visit reviewed. Allergies and medications reviewed and updated.  Review of Systems  Constitutional: Negative for fever and chills.  HENT: Negative for congestion, ear discharge and ear pain.   Eyes: Negative for discharge and visual disturbance.  Respiratory: Positive for cough, shortness of breath and wheezing. Negative for chest tightness.   Cardiovascular: Negative for chest pain and leg swelling.  Gastrointestinal: Negative for nausea, vomiting, abdominal pain, diarrhea, constipation, blood in stool and abdominal distention.  Genitourinary: Negative for difficulty urinating.  Musculoskeletal: Positive for back pain and arthralgias. Negative for gait problem.  Skin: Negative for rash.  Allergic/Immunologic: Negative for environmental allergies.  Neurological: Negative for syncope, light-headedness and headaches.  All other systems reviewed and are negative.   Per HPI unless specifically indicated above  Social History   Social History  . Marital  Status: Legally Separated    Spouse Name: N/A  . Number of Children: 15  . Years of Education: N/A   Occupational History  . Self employed-Farmer    Social History Main Topics  . Smoking status: Former Smoker -- 1.00 packs/day for 25 years    Types: Cigarettes    Quit date: 09/23/1991  . Smokeless tobacco: Not on file  . Alcohol Use: 0.5 oz/week    1 Standard  drinks or equivalent per week  . Drug Use: No  . Sexual Activity: Not on file   Other Topics Concern  . Not on file   Social History Narrative   Patient lives in [place].    Past Surgical History  Procedure Laterality Date  . Cardiac catheterization      bare-metal stent to left anterior descending  . Skin grafting left leg    . Hand surgery    . Back surgery      Family History  Problem Relation Age of Onset  . CAD Neg Hx       Medication List       This list is accurate as of: 02/28/16 11:54 AM.  Always use your most recent med list.               Acidophilus/Goat Milk Caps  Take 1 tablet by mouth daily.     albuterol-ipratropium 18-103 MCG/ACT inhaler  Commonly known as:  COMBIVENT  Inhale 2 puffs into the lungs daily.     aspirin 81 MG tablet  Take 81 mg by mouth daily.     b complex vitamins tablet  Take 1 tablet by mouth daily.     budesonide-formoterol 160-4.5 MCG/ACT inhaler  Commonly known as:  SYMBICORT  Inhale 2 puffs into the lungs 2 (two) times daily.     Calcium-Magnesium-Zinc 500-250-12.5 MG Tabs  Take 1 tablet by mouth daily.     carisoprodol 350 MG tablet  Commonly known as:  SOMA  Take 1 tablet (350 mg total) by mouth 2 (two) times daily as needed for muscle spasms.     loratadine 10 MG tablet  Commonly known as:  CLARITIN  Take 10 mg by mouth daily.     MAGNESIUM PO  Take 1 tablet by mouth daily.     metoprolol tartrate 25 MG tablet  Commonly known as:  LOPRESSOR  TAKE 1 TABLET (25 MG TOTAL) BY MOUTH 2 (TWO) TIMES DAILY.     multivitamin with minerals tablet  Take 1 tablet by mouth daily.     oxyCODONE-acetaminophen 10-325 MG tablet  Commonly known as:  PERCOCET  Take 1 tablet by mouth 2 (two) times daily.     OXYGEN  Inhale into the lungs. 2 liters as needed     oxymetazoline 0.05 % nasal spray  Commonly known as:  AFRIN  Place 1 spray into both nostrils 2 (two) times daily as needed for congestion.     pantoprazole  40 MG tablet  Commonly known as:  PROTONIX  Take 1 tablet (40 mg total) by mouth daily.     potassium chloride SA 20 MEQ tablet  Commonly known as:  K-DUR,KLOR-CON  Take 1 tablet (20 mEq total) by mouth daily.     vitamin C 500 MG tablet  Commonly known as:  ASCORBIC ACID  Take 500 mg by mouth once a week.     VITAMIN D PO  Take 1 capsule by mouth.     vitamin E 400 UNIT capsule  Take 400 Units by mouth.           Objective:    BP 135/84 mmHg  Pulse 55  Temp(Src) 96.6 F (35.9 C) (Oral)  Ht 6' (1.829 m)  Wt 278 lb 3.2 oz (126.191 kg)  BMI 37.72 kg/m2  Wt Readings from Last 3 Encounters:  02/28/16 278 lb 3.2 oz (126.191 kg)  07/20/15 260 lb (117.935 kg)  09/18/14 279 lb (126.554 kg)    Physical Exam  Constitutional: He is oriented to person, place, and time. He appears well-developed and well-nourished. No distress.  HENT:  Right Ear: External ear normal.  Left Ear: External ear normal.  Nose: Nose normal.  Mouth/Throat: Oropharynx is clear and moist. No oropharyngeal exudate.  Eyes: Conjunctivae and EOM are normal. Pupils are equal, round, and reactive to light. Right eye exhibits no discharge. No scleral icterus.  Neck: Neck supple. No thyromegaly present.  Cardiovascular: Normal rate, regular rhythm, normal heart sounds and intact distal pulses.   No murmur heard. Pulmonary/Chest: Effort normal and breath sounds normal. No respiratory distress. He has no wheezes. He has no rales. He exhibits no tenderness.  Abdominal: He exhibits no distension. There is no tenderness. There is no rebound and no guarding.  Musculoskeletal: Normal range of motion. He exhibits no edema.       Lumbar back: He exhibits tenderness (bilateral paraspinal tenderness) and spasm. He exhibits normal range of motion, no bony tenderness, no swelling and no deformity.  Lymphadenopathy:    He has no cervical adenopathy.  Neurological: He is alert and oriented to person, place, and time.  Coordination normal.  Skin: Skin is warm and dry. No rash noted. He is not diaphoretic.  Psychiatric: He has a normal mood and affect. His behavior is normal.  Vitals reviewed.   Results for orders placed or performed during the hospital encounter of 07/20/15  D-dimer, quantitative (not at [facility])  Result Value Ref Range   D-Dimer, Quant 0.57 (H) 0.00 - 0.48 ug/mL-FEU  Basic metabolic panel  Result Value Ref Range   Sodium 139 135 - 145 mmol/L   Potassium 3.8 3.5 - 5.1 mmol/L   Chloride 102 101 - 111 mmol/L   CO2 28 22 - 32 mmol/L   Glucose, Bld 96 65 - 99 mg/dL   BUN 21 (H) 6 - 20 mg/dL   Creatinine, Ser 1.18 0.61 - 1.24 mg/dL   Calcium 8.9 8.9 - 10.3 mg/dL   GFR calc non Af Amer >60 >60 mL/min   GFR calc Af Amer >60 >60 mL/min   Anion gap 9 5 - 15      Assessment & Plan:   Problem List Items Addressed This Visit      Cardiovascular and Mediastinum   HYPERTENSION, BENIGN - Primary   Relevant Medications   metoprolol tartrate (LOPRESSOR) 25 MG tablet   Other Relevant Orders   CMP14+EGFR (Completed)   CAD, NATIVE VESSEL   Relevant Medications   metoprolol tartrate (LOPRESSOR) 25 MG tablet   Other Relevant Orders   Lipid panel (Completed)     Respiratory   COPD (chronic obstructive pulmonary disease) (HCC)   Relevant Medications   budesonide-formoterol (SYMBICORT) 160-4.5 MCG/ACT inhaler   albuterol-ipratropium (COMBIVENT) 18-103 MCG/ACT inhaler     Digestive   GERD (gastroesophageal reflux disease)   Relevant Medications   pantoprazole (PROTONIX) 40 MG tablet     Other   Lumbar back pain   Relevant Medications   carisoprodol (SOMA) 350 MG tablet  Hypercholesterolemia   Relevant Medications   metoprolol tartrate (LOPRESSOR) 25 MG tablet   Other Relevant Orders   Lipid panel (Completed)    Other Visit Diagnoses    Nocturia        Relevant Orders    PSA, total and free (Completed)    Need for hepatitis C screening test        Relevant Orders    Hepatitis  C antibody (Completed)    Encounter for screening for HIV        Relevant Orders    HIV antibody (Completed)        Follow up plan: Return in about 2 weeks (around 03/13/2016), or if symptoms worsen or fail to improve, for initial pain visit.  Caryl Pina, MD [facility] 02/28/2016, 11:54 AM

## 2016-02-29 LAB — CMP14+EGFR
ALK PHOS: 83 IU/L (ref 39–117)
ALT: 27 IU/L (ref 0–44)
AST: 22 IU/L (ref 0–40)
Albumin/Globulin Ratio: 1.8 (ref 1.1–2.5)
Albumin: 4.2 g/dL (ref 3.6–4.8)
BILIRUBIN TOTAL: 0.5 mg/dL (ref 0.0–1.2)
BUN / CREAT RATIO: 15 (ref 10–22)
BUN: 14 mg/dL (ref 8–27)
CHLORIDE: 99 mmol/L (ref 96–106)
CO2: 25 mmol/L (ref 18–29)
Calcium: 9.2 mg/dL (ref 8.6–10.2)
Creatinine, Ser: 0.92 mg/dL (ref 0.76–1.27)
GFR calc Af Amer: 99 mL/min/{1.73_m2} (ref >59)
GFR calc non Af Amer: 86 mL/min/{1.73_m2} (ref >59)
Globulin, Total: 2.4 g/dL (ref 1.5–4.5)
Glucose: 97 mg/dL (ref 65–99)
Potassium: 4.5 mmol/L (ref 3.5–5.2)
Sodium: 141 mmol/L (ref 134–144)
Total Protein: 6.6 g/dL (ref 6.0–8.5)

## 2016-02-29 LAB — LIPID PANEL
CHOLESTEROL TOTAL: 168 mg/dL (ref 100–199)
Chol/HDL Ratio: 4.9 ratio units (ref 0.0–5.0)
HDL: 34 mg/dL — ABNORMAL LOW (ref >39)
LDL Calculated: 113 mg/dL — ABNORMAL HIGH (ref 0–99)
Triglycerides: 106 mg/dL (ref 0–149)
VLDL Cholesterol Cal: 21 mg/dL (ref 5–40)

## 2016-02-29 LAB — PSA, TOTAL AND FREE
PROSTATE SPECIFIC AG, SERUM: 1 ng/mL (ref 0.0–4.0)
PSA, Free Pct: 17 %
PSA, Free: 0.17 ng/mL

## 2016-02-29 LAB — HEPATITIS C ANTIBODY

## 2016-02-29 LAB — HIV ANTIBODY (ROUTINE TESTING W REFLEX): HIV Screen 4th Generation wRfx: NONREACTIVE

## 2016-03-07 DIAGNOSIS — R561 Post traumatic seizures: Secondary | ICD-10-CM | POA: Diagnosis not present

## 2016-03-07 DIAGNOSIS — M47892 Other spondylosis, cervical region: Secondary | ICD-10-CM | POA: Diagnosis not present

## 2016-03-07 DIAGNOSIS — M4322 Fusion of spine, cervical region: Secondary | ICD-10-CM | POA: Diagnosis not present

## 2016-03-18 DIAGNOSIS — G4733 Obstructive sleep apnea (adult) (pediatric): Secondary | ICD-10-CM | POA: Diagnosis not present

## 2016-03-18 DIAGNOSIS — K219 Gastro-esophageal reflux disease without esophagitis: Secondary | ICD-10-CM | POA: Diagnosis not present

## 2016-03-18 DIAGNOSIS — D869 Sarcoidosis, unspecified: Secondary | ICD-10-CM | POA: Diagnosis not present

## 2016-03-18 DIAGNOSIS — I10 Essential (primary) hypertension: Secondary | ICD-10-CM | POA: Diagnosis not present

## 2016-03-20 ENCOUNTER — Encounter: Payer: Self-pay | Admitting: Family Medicine

## 2016-03-20 ENCOUNTER — Ambulatory Visit (INDEPENDENT_AMBULATORY_CARE_PROVIDER_SITE_OTHER): Payer: Commercial Managed Care - HMO | Admitting: Family Medicine

## 2016-03-20 VITALS — BP 134/74 | HR 56 | Temp 97.6°F | Ht 72.0 in | Wt 277.6 lb

## 2016-03-20 DIAGNOSIS — Z79899 Other long term (current) drug therapy: Secondary | ICD-10-CM

## 2016-03-20 DIAGNOSIS — F1120 Opioid dependence, uncomplicated: Secondary | ICD-10-CM

## 2016-03-20 DIAGNOSIS — Z0289 Encounter for other administrative examinations: Secondary | ICD-10-CM

## 2016-03-20 MED ORDER — OXYCODONE-ACETAMINOPHEN 10-325 MG PO TABS
1.0000 | ORAL_TABLET | Freq: Two times a day (BID) | ORAL | Status: DC | PRN
Start: 2016-03-20 — End: 2016-06-16

## 2016-03-20 MED ORDER — OXYCODONE-ACETAMINOPHEN 10-325 MG PO TABS: 1.0000 | ORAL_TABLET | Freq: Two times a day (BID) | ORAL | Status: DC

## 2016-03-20 MED ORDER — OXYCODONE-ACETAMINOPHEN 10-325 MG PO TABS: 1.0000 | ORAL_TABLET | Freq: Two times a day (BID) | ORAL | Status: DC | PRN

## 2016-03-20 NOTE — Progress Notes (Signed)
[place] Controlled Substance Abuse database reviewed- Yes  [facility] 03/20/2016 02/28/2016  Decreased Interest 0 0  Down, Depressed, Hopeless 0 0  PHQ - 2 Score 0 0    GAD 7 : Generalized Anxiety Score 03/20/2016  Nervous, Anxious, on Edge 0  Control/stop worrying 0  Worry too much - different things 0  Trouble relaxing 1  Restless 3  Easily annoyed or irritable 0  Afraid - awful might happen 0  Total GAD 7 Score 4  Anxiety Difficulty Not difficult at all    [facility] drug screen performed- Yes  SOAPP  0= never  1= seldom  2=sometimes  3= often  4= very often  How often do you have mood swings? 0 How often do you smoke a cigarette within an hour after waling up? 0 How often have you taken medication other than the way that it was prescribed?1 How often have you used illegal drugs in the past 5 years? 0 How often, in your lifetime, have you had legal problems or been arrested? 1, convicted for marijuana possession, 6 years  Score 2  Alcohol Audit - How often during the last year have found that you: 0-Never   1- Less than monthly   2- Monthly     3-Weekly     4-daily or almost daily  - found that you were not able to stop drinking once you started- 0 -failed to do what was normally expected of you because of drinking- 0 -needed a first drink in the morning- 0 -had a feeling of guilt or remorse after drinking- 0 -are/were unable to remember what happened the night before because of your drinking- 0  0- NO   2- yes but not in last year  4- yes during last year -Have you or someone else been injured because of your drinking- 0 - Has anyone been concerned about your drinking or suggested you cut down- 0        TOTAL- 0  ( 0-7- alcohol education, 8-15- simple advice, 16-19 simple advice plus counseling, 20-40 referral for evaluation and treatment 0   [facility] [place]  Pain assessment: Cause of pain- low back and left  leg Pain location- DDD Pain on scale of 1-10- 5- gets worse at night Frequency- every night What increases pain-laying What makes pain Better-pain medications, muscle relaxer Effects on ADL - none  Prior treatments tried and failed- back and neck surgery Current treatments- oxycodone, soma Morphine mg equivalent- 30mg   Pain management agreement reviewed and signed- Yes

## 2016-03-25 ENCOUNTER — Other Ambulatory Visit: Payer: Self-pay | Admitting: Family Medicine

## 2016-03-25 LAB — TOXASSURE SELECT 13 (MW), URINE: PDF: 0

## 2016-03-28 DIAGNOSIS — Z89022 Acquired absence of left finger(s): Secondary | ICD-10-CM | POA: Diagnosis not present

## 2016-03-28 DIAGNOSIS — K219 Gastro-esophageal reflux disease without esophagitis: Secondary | ICD-10-CM | POA: Diagnosis not present

## 2016-03-28 DIAGNOSIS — M47812 Spondylosis without myelopathy or radiculopathy, cervical region: Secondary | ICD-10-CM | POA: Diagnosis not present

## 2016-03-28 DIAGNOSIS — I10 Essential (primary) hypertension: Secondary | ICD-10-CM | POA: Diagnosis not present

## 2016-03-28 DIAGNOSIS — D860 Sarcoidosis of lung: Secondary | ICD-10-CM | POA: Diagnosis not present

## 2016-03-28 DIAGNOSIS — I25119 Atherosclerotic heart disease of native coronary artery with unspecified angina pectoris: Secondary | ICD-10-CM | POA: Diagnosis not present

## 2016-03-28 DIAGNOSIS — I252 Old myocardial infarction: Secondary | ICD-10-CM | POA: Diagnosis not present

## 2016-03-28 DIAGNOSIS — Z6836 Body mass index (BMI) 36.0-36.9, adult: Secondary | ICD-10-CM | POA: Diagnosis not present

## 2016-03-28 DIAGNOSIS — M47816 Spondylosis without myelopathy or radiculopathy, lumbar region: Secondary | ICD-10-CM | POA: Diagnosis not present

## 2016-04-17 ENCOUNTER — Other Ambulatory Visit: Payer: Self-pay | Admitting: Family Medicine

## 2016-04-17 NOTE — Telephone Encounter (Signed)
Patient had an abnormal drug screen, he needs to come in for an appointment prior to being able to get a refill.

## 2016-04-17 NOTE — Telephone Encounter (Signed)
Last seen 03/20/16  Dr Dettinger

## 2016-04-18 DIAGNOSIS — D869 Sarcoidosis, unspecified: Secondary | ICD-10-CM | POA: Diagnosis not present

## 2016-04-18 DIAGNOSIS — I10 Essential (primary) hypertension: Secondary | ICD-10-CM | POA: Diagnosis not present

## 2016-04-18 DIAGNOSIS — K219 Gastro-esophageal reflux disease without esophagitis: Secondary | ICD-10-CM | POA: Diagnosis not present

## 2016-04-18 DIAGNOSIS — G4733 Obstructive sleep apnea (adult) (pediatric): Secondary | ICD-10-CM | POA: Diagnosis not present

## 2016-04-24 ENCOUNTER — Encounter: Payer: Self-pay | Admitting: Family Medicine

## 2016-04-24 ENCOUNTER — Ambulatory Visit (INDEPENDENT_AMBULATORY_CARE_PROVIDER_SITE_OTHER): Payer: Commercial Managed Care - HMO | Admitting: Family Medicine

## 2016-04-24 VITALS — BP 140/83 | HR 50 | Temp 96.8°F | Ht 72.0 in | Wt 284.4 lb

## 2016-04-24 DIAGNOSIS — M545 Low back pain, unspecified: Secondary | ICD-10-CM

## 2016-04-24 DIAGNOSIS — J309 Allergic rhinitis, unspecified: Secondary | ICD-10-CM | POA: Diagnosis not present

## 2016-04-24 MED ORDER — FLUTICASONE PROPIONATE 50 MCG/ACT NA SUSP: 1.0000 | Freq: Two times a day (BID) | NASAL | Status: DC | PRN

## 2016-04-24 MED ORDER — CARISOPRODOL 350 MG PO TABS: 350.0000 mg | ORAL_TABLET | Freq: Two times a day (BID) | ORAL | Status: DC | PRN

## 2016-04-24 MED ORDER — LORATADINE 10 MG PO TABS: 10.0000 mg | ORAL_TABLET | Freq: Every day | ORAL | Status: DC

## 2016-04-24 NOTE — Progress Notes (Signed)
BP 140/83 mmHg  Pulse 50  Temp(Src) 96.8 F (36 C) (Oral)  Ht 6' (1.829 m)  Wt 284 lb 6.4 oz (129.003 kg)  BMI 38.56 kg/m2   Subjective:    Patient ID: Brian Harrington, male    DOB: 10-18-1946, 70 y.o.   MRN: [id number]  HPI: Brian Harrington is a 70 y.o. male presenting on 04/24/2016 for Blood Pressure Check and Discuss [facility]ox[facility]   HPI Lumbar back pain chronic follow-up Patient is on Soma and oxycodone and he had a urine drug test test that came back positive for benzodiazepines. We clarified today in the visit that he was on benzos prior to coming to [place] and ended them when he came to our office. The records that he has from his previous provider confirm that. He denies any issues with the medications and they do control his pain sufficiently.  Chronic allergies Patient has been having sinus congestion and allergies that he gets every year persist. Sinus pressure and postnasal drainage sometimes make him have coughing spells at night especially. He has used some over-the-counter medications for this previously but wanted to know what works best for her.  Relevant past medical, surgical, family and social history reviewed and updated as indicated. Interim medical history since our last visit reviewed. Allergies and medications reviewed and updated.  Review of Systems  Constitutional: Negative for fever and chills.  HENT: Positive for congestion, postnasal drip, rhinorrhea, sinus pressure, sneezing and sore throat. Negative for ear discharge, ear pain and voice change.   Eyes: Negative for pain, discharge, redness and visual disturbance.  Respiratory: Positive for cough. Negative for shortness of breath and wheezing.   Cardiovascular: Negative for chest pain and leg swelling.  Gastrointestinal: Negative for abdominal pain, diarrhea and constipation.  Genitourinary: Negative for difficulty urinating.  Musculoskeletal: Negative for back pain and gait problem.  Skin: Negative for rash.    Neurological: Negative for syncope, light-headedness and headaches.  All other systems reviewed and are negative.   Per HPI unless specifically indicated above     Medication List       This list is accurate as of: 04/24/16  9:18 AM.  Always use your most recent med list.               Acidophilus/Goat Milk Caps  Take 1 tablet by mouth daily.     albuterol-ipratropium 18-103 MCG/ACT inhaler  Commonly known as:  COMBIVENT  Inhale 2 puffs into the lungs daily.     aspirin 81 MG tablet  Take 81 mg by mouth daily.     b complex vitamins tablet  Take 1 tablet by mouth daily.     budesonide-formoterol 160-4.5 MCG/ACT inhaler  Commonly known as:  SYMBICORT  Inhale 2 puffs into the lungs 2 (two) times daily.     Calcium-Magnesium-Zinc 500-250-12.5 MG Tabs  Take 1 tablet by mouth daily.     carisoprodol 350 MG tablet  Commonly known as:  SOMA  Take 1 tablet (350 mg total) by mouth 2 (two) times daily as needed for muscle spasms.     fluticasone 50 MCG/ACT nasal spray  Commonly known as:  FLONASE  Place 1 spray into both nostrils 2 (two) times daily as needed for allergies or rhinitis.     loratadine 10 MG tablet  Commonly known as:  CLARITIN  Take 1 tablet (10 mg total) by mouth daily.     MAGNESIUM PO  Take 1 tablet by mouth daily.  metoprolol tartrate 25 MG tablet  Commonly known as:  LOPRESSOR  TAKE 1 TABLET (25 MG TOTAL) BY MOUTH 2 (TWO) TIMES DAILY.     multivitamin with minerals tablet  Take 1 tablet by mouth daily.     oxyCODONE-acetaminophen 10-325 MG tablet  Commonly known as:  PERCOCET  Take 1 tablet by mouth 2 (two) times daily.     oxyCODONE-acetaminophen 10-325 MG tablet  Commonly known as:  PERCOCET  Take 1 tablet by mouth 2 (two) times daily as needed for pain. Fill 1 month from prescription date     oxyCODONE-acetaminophen 10-325 MG tablet  Commonly known as:  PERCOCET  Take 1 tablet by mouth 2 (two) times daily as needed for pain. Fill  2 months from prescription date     OXYGEN  Inhale into the lungs. 2 liters as needed     oxymetazoline 0.05 % nasal spray  Commonly known as:  AFRIN  Place 1 spray into both nostrils 2 (two) times daily as needed for congestion.     pantoprazole 40 MG tablet  Commonly known as:  PROTONIX  Take 1 tablet (40 mg total) by mouth daily.     potassium chloride SA 20 MEQ tablet  Commonly known as:  K-DUR,KLOR-CON  Take 1 tablet (20 mEq total) by mouth daily.     vitamin C 500 MG tablet  Commonly known as:  ASCORBIC ACID  Take 500 mg by mouth once a week.     VITAMIN D PO  Take 1 capsule by mouth.     vitamin E 400 UNIT capsule  Take 400 Units by mouth.           Objective:    BP 140/83 mmHg  Pulse 50  Temp(Src) 96.8 F (36 C) (Oral)  Ht 6' (1.829 m)  Wt 284 lb 6.4 oz (129.003 kg)  BMI 38.56 kg/m2  Wt Readings from Last 3 Encounters:  04/24/16 284 lb 6.4 oz (129.003 kg)  03/20/16 277 lb 9.6 oz (125.919 kg)  02/28/16 278 lb 3.2 oz (126.191 kg)    Physical Exam  Constitutional: He is oriented to person, place, and time. He appears well-developed and well-nourished. No distress.  HENT:  Right Ear: Tympanic membrane, external ear and ear canal normal.  Left Ear: Tympanic membrane, external ear and ear canal normal.  Nose: Mucosal edema and rhinorrhea present. No sinus tenderness. No epistaxis. Right sinus exhibits maxillary sinus tenderness. Right sinus exhibits no frontal sinus tenderness. Left sinus exhibits maxillary sinus tenderness. Left sinus exhibits no frontal sinus tenderness.  Mouth/Throat: Uvula is midline and mucous membranes are normal. Posterior oropharyngeal edema and posterior oropharyngeal erythema present. No oropharyngeal exudate or tonsillar abscesses.  Eyes: Conjunctivae and EOM are normal. Pupils are equal, round, and reactive to light. Right eye exhibits no discharge. No scleral icterus.  Neck: Neck supple. No thyromegaly present.  Cardiovascular:  Normal rate, regular rhythm, normal heart sounds and intact distal pulses.   No murmur heard. Pulmonary/Chest: Effort normal and breath sounds normal. No respiratory distress. He has no wheezes. He has no rales.  Musculoskeletal: Normal range of motion. He exhibits no edema.  Lymphadenopathy:    He has no cervical adenopathy.  Neurological: He is alert and oriented to person, place, and time. Coordination normal.  Skin: Skin is warm and dry. No rash noted. He is not diaphoretic.  Psychiatric: He has a normal mood and affect. His behavior is normal.  Nursing note and vitals reviewed.   Results for  orders placed or performed in visit on 03/20/16  ToxASSURE Select 13 (MW), Urine  Result Value Ref Range   Report Summary FINAL    PDF .       Assessment & Plan:       Problem List Items Addressed This Visit      Respiratory   Chronic allergic rhinitis   Relevant Medications   loratadine (CLARITIN) 10 MG tablet   fluticasone (FLONASE) 50 MCG/ACT nasal spray     Other   Lumbar back pain - Primary   Relevant Medications   carisoprodol (SOMA) 350 MG tablet       Follow up plan: Return in about 6 weeks (around 06/05/2016), or if symptoms worsen or fail to improve, for Follow-up pain management.  Counseling provided for all of the vaccine components No orders of the defined types were placed in this encounter.    Arville Care, MD [facility] 04/24/2016, 9:18 AM

## 2016-05-18 DIAGNOSIS — I10 Essential (primary) hypertension: Secondary | ICD-10-CM | POA: Diagnosis not present

## 2016-05-18 DIAGNOSIS — G4733 Obstructive sleep apnea (adult) (pediatric): Secondary | ICD-10-CM | POA: Diagnosis not present

## 2016-05-18 DIAGNOSIS — K219 Gastro-esophageal reflux disease without esophagitis: Secondary | ICD-10-CM | POA: Diagnosis not present

## 2016-05-18 DIAGNOSIS — D869 Sarcoidosis, unspecified: Secondary | ICD-10-CM | POA: Diagnosis not present

## 2016-05-19 DIAGNOSIS — H524 Presbyopia: Secondary | ICD-10-CM | POA: Diagnosis not present

## 2016-05-19 DIAGNOSIS — H2510 Age-related nuclear cataract, unspecified eye: Secondary | ICD-10-CM | POA: Diagnosis not present

## 2016-05-19 DIAGNOSIS — H5210 Myopia, unspecified eye: Secondary | ICD-10-CM | POA: Diagnosis not present

## 2016-05-19 DIAGNOSIS — H5203 Hypermetropia, bilateral: Secondary | ICD-10-CM | POA: Diagnosis not present

## 2016-05-19 DIAGNOSIS — H52229 Regular astigmatism, unspecified eye: Secondary | ICD-10-CM | POA: Diagnosis not present

## 2016-06-16 ENCOUNTER — Ambulatory Visit (INDEPENDENT_AMBULATORY_CARE_PROVIDER_SITE_OTHER): Payer: Commercial Managed Care - HMO | Admitting: Family Medicine

## 2016-06-16 ENCOUNTER — Encounter (INDEPENDENT_AMBULATORY_CARE_PROVIDER_SITE_OTHER): Payer: Self-pay

## 2016-06-16 ENCOUNTER — Encounter: Payer: Self-pay | Admitting: Family Medicine

## 2016-06-16 DIAGNOSIS — F1120 Opioid dependence, uncomplicated: Secondary | ICD-10-CM | POA: Diagnosis not present

## 2016-06-16 DIAGNOSIS — J439 Emphysema, unspecified: Secondary | ICD-10-CM | POA: Diagnosis not present

## 2016-06-16 DIAGNOSIS — M545 Low back pain, unspecified: Secondary | ICD-10-CM

## 2016-06-16 MED ORDER — BUDESONIDE-FORMOTEROL FUMARATE 160-4.5 MCG/ACT IN AERO: 2.0000 | INHALATION_SPRAY | Freq: Two times a day (BID) | RESPIRATORY_TRACT | Status: DC

## 2016-06-16 MED ORDER — OXYCODONE-ACETAMINOPHEN 10-325 MG PO TABS: 1.0000 | ORAL_TABLET | Freq: Two times a day (BID) | ORAL | Status: DC | PRN

## 2016-06-16 MED ORDER — OXYCODONE-ACETAMINOPHEN 10-325 MG PO TABS: 1.0000 | ORAL_TABLET | Freq: Two times a day (BID) | ORAL | Status: DC

## 2016-06-16 MED ORDER — CARISOPRODOL 350 MG PO TABS: 350.0000 mg | ORAL_TABLET | Freq: Two times a day (BID) | ORAL | Status: DC | PRN

## 2016-06-16 MED ORDER — IPRATROPIUM-ALBUTEROL 18-103 MCG/ACT IN AERO: 2.0000 | INHALATION_SPRAY | Freq: Every day | RESPIRATORY_TRACT | Status: DC

## 2016-06-16 MED ORDER — UMECLIDINIUM BROMIDE 62.5 MCG/INH IN AEPB: 1.0000 | INHALATION_SPRAY | Freq: Every day | RESPIRATORY_TRACT | Status: DC

## 2016-06-16 NOTE — Progress Notes (Signed)
BP 137/83 mmHg  Pulse 72  Temp(Src) 97.5 F (36.4 C) (Oral)  Ht 6' (1.829 m)  Wt 283 lb 9.6 oz (128.64 kg)  BMI 38.45 kg/m2   Subjective:    Patient ID: Brian Harrington, male    DOB: 04/16/1946, 70 y.o.   MRN: [id number]  HPI: Brian Harrington is a 70 y.o. male presenting on 06/16/2016 for Back Pain and Medication Refill   HPI Recheck on back pain.  Patient is coming in today for recheck on his chronic back pain. He says with the weather changes and things have been going on currently he has been having some more back pain. He says he is getting occasional breakthrough for his pain. He is wondering if he can get an extra every now and then for that breakthrough during the season because of the humidity he is having some more issues. He denies any tingling or numbness going down either side of his legs. He rates his back pain currently today as a 3 out of 10 but he didn't take his medication this morning recently. The pain does not radiate anywhere else. He denies any suicidal ideations or thoughts of hurting himself.  COPD recheck Patient is coming in today for a COPD recheck. He says he is still having use his Combivent rescue inhaler 2-6 times daily. He is using the Symbicort twice daily he is still having wheezing during the daytime periods. He says he gets a lot of issues this time year because of the pollen and the humidity in the weather changes that we've been having. He denies any fevers or chills. He has been having some shortness of breath and some wheezing intermittently but he is not having them currently today is his CNS. He also has his oxygen which he uses when necessary but he has been having to use it a little more frequently in the past few weeks. He also has a nonproductive cough that has increased more recently.  Relevant past medical, surgical, family and social history reviewed and updated as indicated. Interim medical history since our last visit reviewed. Allergies and  medications reviewed and updated.  Review of Systems  Constitutional: Negative for fever and chills.  HENT: Negative for congestion, ear discharge and ear pain.   Eyes: Negative for discharge and visual disturbance.  Respiratory: Positive for cough (Nonproductive), shortness of breath and wheezing. Negative for chest tightness.   Cardiovascular: Negative for chest pain and leg swelling.  Gastrointestinal: Negative for abdominal pain, diarrhea and constipation.  Endocrine: Negative for cold intolerance and heat intolerance.  Genitourinary: Negative for difficulty urinating.  Musculoskeletal: Positive for myalgias and back pain. Negative for joint swelling, gait problem and neck pain.  Skin: Negative for color change and rash.  Neurological: Negative for dizziness, syncope, light-headedness and headaches.  All other systems reviewed and are negative.   Per HPI unless specifically indicated above     Medication List       This list is accurate as of: 06/16/16  9:22 AM.  Always use your most recent med list.               Acidophilus/Goat Milk Caps  Take 1 tablet by mouth daily.     albuterol-ipratropium 18-103 MCG/ACT inhaler  Commonly known as:  COMBIVENT  Inhale 2 puffs into the lungs daily.     aspirin 81 MG tablet  Take 81 mg by mouth daily.     b complex vitamins tablet  Take 1  tablet by mouth daily.     budesonide-formoterol 160-4.5 MCG/ACT inhaler  Commonly known as:  SYMBICORT  Inhale 2 puffs into the lungs 2 (two) times daily.     Calcium-Magnesium-Zinc 500-250-12.5 MG Tabs  Take 1 tablet by mouth daily.     carisoprodol 350 MG tablet  Commonly known as:  SOMA  Take 1 tablet (350 mg total) by mouth 2 (two) times daily as needed for muscle spasms.     fluticasone 50 MCG/ACT nasal spray  Commonly known as:  FLONASE  Place 1 spray into both nostrils 2 (two) times daily as needed for allergies or rhinitis.     loratadine 10 MG tablet  Commonly known as:   CLARITIN  Take 1 tablet (10 mg total) by mouth daily.     MAGNESIUM PO  Take 1 tablet by mouth daily.     metoprolol tartrate 25 MG tablet  Commonly known as:  LOPRESSOR  TAKE 1 TABLET (25 MG TOTAL) BY MOUTH 2 (TWO) TIMES DAILY.     multivitamin with minerals tablet  Take 1 tablet by mouth daily.     oxyCODONE-acetaminophen 10-325 MG tablet  Commonly known as:  PERCOCET  Take 1 tablet by mouth 2 (two) times daily as needed for pain. Fill 2 months from prescription date     oxyCODONE-acetaminophen 10-325 MG tablet  Commonly known as:  PERCOCET  Take 1 tablet by mouth 2 (two) times daily as needed for pain. Fill 1 month from prescription date     oxyCODONE-acetaminophen 10-325 MG tablet  Commonly known as:  PERCOCET  Take 1 tablet by mouth 2 (two) times daily.     OXYGEN  Inhale into the lungs. 2 liters as needed     oxymetazoline 0.05 % nasal spray  Commonly known as:  AFRIN  Place 1 spray into both nostrils 2 (two) times daily as needed for congestion.     pantoprazole 40 MG tablet  Commonly known as:  PROTONIX  Take 1 tablet (40 mg total) by mouth daily.     potassium chloride SA 20 MEQ tablet  Commonly known as:  K-DUR,KLOR-CON  Take 1 tablet (20 mEq total) by mouth daily.     umeclidinium bromide 62.5 MCG/INH Aepb  Commonly known as:  INCRUSE ELLIPTA  Inhale 1 puff into the lungs daily.     vitamin C 500 MG tablet  Commonly known as:  ASCORBIC ACID  Take 500 mg by mouth once a week.     VITAMIN D PO  Take 1 capsule by mouth.     vitamin E 400 UNIT capsule  Take 400 Units by mouth.           Objective:    BP 137/83 mmHg  Pulse 72  Temp(Src) 97.5 F (36.4 C) (Oral)  Ht 6' (1.829 m)  Wt 283 lb 9.6 oz (128.64 kg)  BMI 38.45 kg/m2  Wt Readings from Last 3 Encounters:  06/16/16 283 lb 9.6 oz (128.64 kg)  04/24/16 284 lb 6.4 oz (129.003 kg)  03/20/16 277 lb 9.6 oz (125.919 kg)    Physical Exam  Constitutional: He is oriented to person, place, and  time. He appears well-developed and well-nourished. No distress.  HENT:  Right Ear: External ear normal.  Left Ear: External ear normal.  Nose: Nose normal.  Mouth/Throat: Oropharynx is clear and moist.  Eyes: Conjunctivae and EOM are normal. Pupils are equal, round, and reactive to light. Right eye exhibits no discharge. No scleral icterus.  Neck:  Neck supple. No thyromegaly present.  Cardiovascular: Normal rate, regular rhythm, normal heart sounds and intact distal pulses.   No murmur heard. Pulmonary/Chest: Effort normal. No respiratory distress. He has no wheezes. He has no rales. He exhibits no tenderness.  Musculoskeletal: Normal range of motion. He exhibits tenderness (Bilateral low lumbar tenderness and midline tenderness. Negative straight leg raise bilaterally). He exhibits no edema.  Lymphadenopathy:    He has no cervical adenopathy.  Neurological: He is alert and oriented to person, place, and time. Coordination normal.  Skin: Skin is warm and dry. No rash noted. He is not diaphoretic.  Psychiatric: He has a normal mood and affect. His behavior is normal.  Nursing note and vitals reviewed.      Assessment & Plan:   Problem List Items Addressed This Visit      Respiratory   COPD (chronic obstructive pulmonary disease) (HCC)   Relevant Medications   budesonide-formoterol (SYMBICORT) 160-4.5 MCG/ACT inhaler   albuterol-ipratropium (COMBIVENT) 18-103 MCG/ACT inhaler   umeclidinium bromide (INCRUSE ELLIPTA) 62.5 MCG/INH AEPB     Other   Lumbar back pain   Relevant Medications   oxyCODONE-acetaminophen (PERCOCET) 10-325 MG tablet   oxyCODONE-acetaminophen (PERCOCET) 10-325 MG tablet   oxyCODONE-acetaminophen (PERCOCET) 10-325 MG tablet   carisoprodol (SOMA) 350 MG tablet    Other Visit Diagnoses    Uncomplicated opioid dependence (HCC)        Relevant Medications    oxyCODONE-acetaminophen (PERCOCET) 10-325 MG tablet    oxyCODONE-acetaminophen (PERCOCET) 10-325 MG  tablet    oxyCODONE-acetaminophen (PERCOCET) 10-325 MG tablet       Follow up plan: Return in about 3 months (around 09/16/2016), or if symptoms worsen or fail to improve, for recheck.  Counseling provided for all of the vaccine components No orders of the defined types were placed in this encounter.    Arville Care, MD [facility] 06/16/2016, 9:22 AM  Pain assessment: Cause of pain-  Pain location- low back bilateral Pain on scale of 1-10- 3 Frequency- What increases pain-movement and weather changes What makes pain Better-stretching and massage and opiates Effects on ADL - has some decreased ability to move around and do the things around his house that he normally likes to do Any change in general medical condition-no change but does have COPD that's not controlled but has not been controlled for some time  Current medications- oxycodone/acetaminophen and Soma Effectiveness of current meds-effective but does not last long enough Adverse reactions form pain meds-none  Pill count performed-No Urine drug screen- No

## 2016-06-18 DIAGNOSIS — K219 Gastro-esophageal reflux disease without esophagitis: Secondary | ICD-10-CM | POA: Diagnosis not present

## 2016-06-18 DIAGNOSIS — G4733 Obstructive sleep apnea (adult) (pediatric): Secondary | ICD-10-CM | POA: Diagnosis not present

## 2016-06-18 DIAGNOSIS — D869 Sarcoidosis, unspecified: Secondary | ICD-10-CM | POA: Diagnosis not present

## 2016-06-18 DIAGNOSIS — I10 Essential (primary) hypertension: Secondary | ICD-10-CM | POA: Diagnosis not present

## 2016-06-24 ENCOUNTER — Ambulatory Visit: Payer: Commercial Managed Care - HMO | Admitting: Family Medicine

## 2016-06-25 ENCOUNTER — Ambulatory Visit: Payer: Commercial Managed Care - HMO | Admitting: Family Medicine

## 2016-07-11 ENCOUNTER — Emergency Department (HOSPITAL_COMMUNITY): Payer: No Typology Code available for payment source

## 2016-07-11 ENCOUNTER — Encounter (HOSPITAL_COMMUNITY): Payer: Self-pay

## 2016-07-11 ENCOUNTER — Emergency Department (HOSPITAL_COMMUNITY)
Admission: EM | Admit: 2016-07-11 | Discharge: 2016-07-11 | Disposition: A | Discharge status: Home / Self Care | Payer: No Typology Code available for payment source | Attending: Emergency Medicine | Admitting: Emergency Medicine

## 2016-07-11 DIAGNOSIS — I2510 Atherosclerotic heart disease of native coronary artery without angina pectoris: Secondary | ICD-10-CM | POA: Insufficient documentation

## 2016-07-11 DIAGNOSIS — I252 Old myocardial infarction: Secondary | ICD-10-CM | POA: Insufficient documentation

## 2016-07-11 DIAGNOSIS — I10 Essential (primary) hypertension: Secondary | ICD-10-CM | POA: Insufficient documentation

## 2016-07-11 DIAGNOSIS — R519 Headache, unspecified: Secondary | ICD-10-CM

## 2016-07-11 DIAGNOSIS — Y9389 Activity, other specified: Secondary | ICD-10-CM | POA: Insufficient documentation

## 2016-07-11 DIAGNOSIS — Z7982 Long term (current) use of aspirin: Secondary | ICD-10-CM | POA: Insufficient documentation

## 2016-07-11 DIAGNOSIS — J449 Chronic obstructive pulmonary disease, unspecified: Secondary | ICD-10-CM | POA: Insufficient documentation

## 2016-07-11 DIAGNOSIS — R079 Chest pain, unspecified: Secondary | ICD-10-CM | POA: Insufficient documentation

## 2016-07-11 DIAGNOSIS — R109 Unspecified abdominal pain: Secondary | ICD-10-CM | POA: Diagnosis not present

## 2016-07-11 DIAGNOSIS — Z87891 Personal history of nicotine dependence: Secondary | ICD-10-CM | POA: Diagnosis not present

## 2016-07-11 DIAGNOSIS — M1990 Unspecified osteoarthritis, unspecified site: Secondary | ICD-10-CM | POA: Diagnosis not present

## 2016-07-11 DIAGNOSIS — Z79899 Other long term (current) drug therapy: Secondary | ICD-10-CM | POA: Diagnosis not present

## 2016-07-11 DIAGNOSIS — Y92410 Unspecified street and highway as the place of occurrence of the external cause: Secondary | ICD-10-CM | POA: Insufficient documentation

## 2016-07-11 DIAGNOSIS — M545 Low back pain: Secondary | ICD-10-CM | POA: Diagnosis not present

## 2016-07-11 DIAGNOSIS — R51 Headache: Secondary | ICD-10-CM

## 2016-07-11 DIAGNOSIS — Y999 Unspecified external cause status: Secondary | ICD-10-CM | POA: Insufficient documentation

## 2016-07-11 DIAGNOSIS — S060X0A Concussion without loss of consciousness, initial encounter: Secondary | ICD-10-CM | POA: Diagnosis not present

## 2016-07-11 DIAGNOSIS — M25512 Pain in left shoulder: Secondary | ICD-10-CM | POA: Diagnosis not present

## 2016-07-11 LAB — I-STAT CHEM 8, ED
BUN: 29 mg/dL — AB (ref 6–20)
CREATININE: 1.1 mg/dL (ref 0.61–1.24)
Calcium, Ion: 1.12 mmol/L (ref 1.12–1.23)
Chloride: 100 mmol/L — ABNORMAL LOW (ref 101–111)
GLUCOSE: 114 mg/dL — AB (ref 65–99)
HCT: 43 % (ref 39.0–52.0)
HEMOGLOBIN: 14.6 g/dL (ref 13.0–17.0)
Potassium: 3.6 mmol/L (ref 3.5–5.1)
Sodium: 140 mmol/L (ref 135–145)
TCO2: 26 mmol/L (ref 0–100)

## 2016-07-11 MED ORDER — IOPAMIDOL (ISOVUE-370) INJECTION 76%
75.0000 mL | Freq: Once | INTRAVENOUS | Status: AC | PRN
  Administered 2016-07-11: 75 mL via INTRAVENOUS

## 2016-07-11 MED ORDER — SODIUM CHLORIDE 0.9 % IV BOLUS (SEPSIS)
1000.0000 mL | Freq: Once | INTRAVENOUS | Status: AC
  Administered 2016-07-11: 1000 mL via INTRAVENOUS
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
## 2016-07-11 NOTE — ED Notes (Signed)
MD at bedside. 

## 2016-07-11 NOTE — ED Notes (Signed)
EDP at bedside updating patient. 

## 2016-07-11 NOTE — ED Notes (Signed)
Pt taken to CT via WC

## 2016-07-18 DIAGNOSIS — D869 Sarcoidosis, unspecified: Secondary | ICD-10-CM | POA: Diagnosis not present

## 2016-08-13 ENCOUNTER — Other Ambulatory Visit: Payer: Self-pay | Admitting: Family Medicine

## 2016-08-13 DIAGNOSIS — J439 Emphysema, unspecified: Secondary | ICD-10-CM

## 2016-08-18 DIAGNOSIS — D869 Sarcoidosis, unspecified: Secondary | ICD-10-CM | POA: Diagnosis not present

## 2016-09-15 ENCOUNTER — Other Ambulatory Visit: Payer: Self-pay | Admitting: Family Medicine

## 2016-09-16 ENCOUNTER — Ambulatory Visit: Payer: Commercial Managed Care - HMO | Admitting: Family Medicine

## 2016-09-16 ENCOUNTER — Encounter: Payer: Self-pay | Admitting: Family Medicine

## 2016-09-16 ENCOUNTER — Ambulatory Visit (INDEPENDENT_AMBULATORY_CARE_PROVIDER_SITE_OTHER): Payer: Commercial Managed Care - HMO | Admitting: Family Medicine

## 2016-09-16 VITALS — BP 129/78 | HR 76 | Temp 97.0°F | Ht 72.0 in | Wt 292.0 lb

## 2016-09-16 DIAGNOSIS — F1120 Opioid dependence, uncomplicated: Secondary | ICD-10-CM

## 2016-09-16 DIAGNOSIS — M545 Low back pain, unspecified: Secondary | ICD-10-CM

## 2016-09-16 DIAGNOSIS — Z23 Encounter for immunization: Secondary | ICD-10-CM | POA: Diagnosis not present

## 2016-09-16 MED ORDER — TIZANIDINE HCL 4 MG PO TABS: 4.0000 mg | ORAL_TABLET | Freq: Four times a day (QID) | ORAL | 0 refills | Status: DC | PRN

## 2016-09-16 MED ORDER — OXYCODONE-ACETAMINOPHEN 10-325 MG PO TABS: 1.0000 | ORAL_TABLET | Freq: Two times a day (BID) | ORAL | 0 refills | Status: DC

## 2016-09-16 MED ORDER — CARISOPRODOL 350 MG PO TABS: 350.0000 mg | ORAL_TABLET | Freq: Two times a day (BID) | ORAL | 3 refills | Status: DC | PRN

## 2016-09-16 MED ORDER — OXYCODONE-ACETAMINOPHEN 10-325 MG PO TABS: 1.0000 | ORAL_TABLET | Freq: Two times a day (BID) | ORAL | 0 refills | Status: DC | PRN
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
## 2016-09-18 DIAGNOSIS — D869 Sarcoidosis, unspecified: Secondary | ICD-10-CM | POA: Diagnosis not present

## 2016-09-22 LAB — TOXASSURE SELECT 13 (MW), URINE

## 2016-10-14 ENCOUNTER — Other Ambulatory Visit: Payer: Self-pay | Admitting: Family Medicine

## 2016-10-14 DIAGNOSIS — F1120 Opioid dependence, uncomplicated: Secondary | ICD-10-CM

## 2016-10-14 DIAGNOSIS — M545 Low back pain, unspecified: Secondary | ICD-10-CM

## 2016-10-18 DIAGNOSIS — D869 Sarcoidosis, unspecified: Secondary | ICD-10-CM | POA: Diagnosis not present

## 2016-11-18 DIAGNOSIS — D869 Sarcoidosis, unspecified: Secondary | ICD-10-CM | POA: Diagnosis not present

## 2016-12-17 ENCOUNTER — Ambulatory Visit (INDEPENDENT_AMBULATORY_CARE_PROVIDER_SITE_OTHER): Payer: Commercial Managed Care - HMO | Admitting: Family Medicine

## 2016-12-17 ENCOUNTER — Encounter: Payer: Self-pay | Admitting: Family Medicine

## 2016-12-17 ENCOUNTER — Other Ambulatory Visit: Payer: Self-pay | Admitting: Family Medicine

## 2016-12-17 ENCOUNTER — Ambulatory Visit: Payer: Commercial Managed Care - HMO | Admitting: Family Medicine

## 2016-12-17 VITALS — BP 116/84 | HR 65 | Temp 96.7°F | Ht 72.0 in | Wt 290.0 lb

## 2016-12-17 DIAGNOSIS — M545 Low back pain, unspecified: Secondary | ICD-10-CM

## 2016-12-17 DIAGNOSIS — F1120 Opioid dependence, uncomplicated: Secondary | ICD-10-CM

## 2016-12-17 DIAGNOSIS — G8929 Other chronic pain: Secondary | ICD-10-CM

## 2016-12-17 DIAGNOSIS — J439 Emphysema, unspecified: Secondary | ICD-10-CM | POA: Diagnosis not present

## 2016-12-17 DIAGNOSIS — I10 Essential (primary) hypertension: Secondary | ICD-10-CM

## 2016-12-17 DIAGNOSIS — L03811 Cellulitis of head [any part, except face]: Secondary | ICD-10-CM

## 2016-12-17 DIAGNOSIS — E7800 Pure hypercholesterolemia, unspecified: Secondary | ICD-10-CM

## 2016-12-17 MED ORDER — OXYCODONE-ACETAMINOPHEN 10-325 MG PO TABS: 1.0000 | ORAL_TABLET | Freq: Two times a day (BID) | ORAL | 0 refills | Status: DC | PRN

## 2016-12-17 MED ORDER — OXYCODONE-ACETAMINOPHEN 10-325 MG PO TABS: 1.0000 | ORAL_TABLET | Freq: Two times a day (BID) | ORAL | 0 refills | Status: DC

## 2016-12-17 MED ORDER — DOXYCYCLINE MONOHYDRATE 100 MG PO TABS: 100.0000 mg | ORAL_TABLET | Freq: Two times a day (BID) | ORAL | 0 refills | Status: DC
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
## 2016-12-17 NOTE — Assessment & Plan Note (Signed)
Still using albuterol and Combivent about 3 times a day. Discussed going to see a pulmonologist but he would like to wait for now. He is doing well today but has had a lot of issues since the cold weather started.

## 2016-12-18 DIAGNOSIS — D869 Sarcoidosis, unspecified: Secondary | ICD-10-CM | POA: Diagnosis not present

## 2016-12-18 LAB — CMP14+EGFR
ALK PHOS: 83 IU/L (ref 39–117)
ALT: 50 IU/L — AB (ref 0–44)
AST: 31 IU/L (ref 0–40)
Albumin/Globulin Ratio: 1.7 (ref 1.2–2.2)
Albumin: 4.1 g/dL (ref 3.6–4.8)
BUN/Creatinine Ratio: 19 (ref 10–24)
BUN: 20 mg/dL (ref 8–27)
Bilirubin Total: 0.3 mg/dL (ref 0.0–1.2)
CALCIUM: 8.8 mg/dL (ref 8.6–10.2)
CO2: 23 mmol/L (ref 18–29)
CREATININE: 1.06 mg/dL (ref 0.76–1.27)
Chloride: 101 mmol/L (ref 96–106)
GFR calc Af Amer: 83 mL/min/{1.73_m2} (ref >59)
GFR, EST NON AFRICAN AMERICAN: 72 mL/min/{1.73_m2} (ref >59)
GLUCOSE: 101 mg/dL — AB (ref 65–99)
Globulin, Total: 2.4 g/dL (ref 1.5–4.5)
Potassium: 4.1 mmol/L (ref 3.5–5.2)
Sodium: 140 mmol/L (ref 134–144)
Total Protein: 6.5 g/dL (ref 6.0–8.5)

## 2016-12-18 LAB — LIPID PANEL
CHOL/HDL RATIO: 5.3 ratio — AB (ref 0.0–5.0)
CHOLESTEROL TOTAL: 160 mg/dL (ref 100–199)
HDL: 30 mg/dL — AB (ref >39)
LDL CALC: 105 mg/dL — AB (ref 0–99)
TRIGLYCERIDES: 125 mg/dL (ref 0–149)
VLDL CHOLESTEROL CAL: 25 mg/dL (ref 5–40)

## 2017-01-16 ENCOUNTER — Other Ambulatory Visit: Payer: Self-pay | Admitting: Family Medicine

## 2017-01-16 DIAGNOSIS — M545 Low back pain, unspecified: Secondary | ICD-10-CM

## 2017-01-16 NOTE — Telephone Encounter (Signed)
Refill called to [facility] 

## 2017-01-16 NOTE — Telephone Encounter (Signed)
Seen In DEC - w/ Dettinger

## 2017-01-18 DIAGNOSIS — D869 Sarcoidosis, unspecified: Secondary | ICD-10-CM | POA: Diagnosis not present

## 2017-01-29 DIAGNOSIS — D869 Sarcoidosis, unspecified: Secondary | ICD-10-CM | POA: Diagnosis not present

## 2017-02-16 ENCOUNTER — Other Ambulatory Visit: Payer: Self-pay | Admitting: Family Medicine

## 2017-02-16 DIAGNOSIS — J439 Emphysema, unspecified: Secondary | ICD-10-CM

## 2017-02-16 DIAGNOSIS — F1120 Opioid dependence, uncomplicated: Secondary | ICD-10-CM

## 2017-02-16 DIAGNOSIS — M545 Low back pain, unspecified: Secondary | ICD-10-CM

## 2017-02-18 DIAGNOSIS — D869 Sarcoidosis, unspecified: Secondary | ICD-10-CM | POA: Diagnosis not present

## 2017-03-17 DIAGNOSIS — H5200 Hypermetropia, unspecified eye: Secondary | ICD-10-CM | POA: Diagnosis not present

## 2017-03-17 DIAGNOSIS — H25813 Combined forms of age-related cataract, bilateral: Secondary | ICD-10-CM | POA: Diagnosis not present

## 2017-03-17 DIAGNOSIS — Z0100 Encounter for examination of eyes and vision without abnormal findings: Secondary | ICD-10-CM | POA: Diagnosis not present

## 2017-03-18 ENCOUNTER — Other Ambulatory Visit: Payer: Self-pay | Admitting: Family Medicine

## 2017-03-18 ENCOUNTER — Encounter (INDEPENDENT_AMBULATORY_CARE_PROVIDER_SITE_OTHER): Payer: Self-pay

## 2017-03-18 ENCOUNTER — Ambulatory Visit (INDEPENDENT_AMBULATORY_CARE_PROVIDER_SITE_OTHER): Payer: Medicare HMO | Admitting: Family Medicine

## 2017-03-18 ENCOUNTER — Encounter: Payer: Self-pay | Admitting: Family Medicine

## 2017-03-18 VITALS — BP 129/84 | HR 74 | Temp 98.0°F | Ht 72.0 in | Wt 293.0 lb

## 2017-03-18 DIAGNOSIS — G8929 Other chronic pain: Secondary | ICD-10-CM | POA: Diagnosis not present

## 2017-03-18 DIAGNOSIS — D869 Sarcoidosis, unspecified: Secondary | ICD-10-CM | POA: Diagnosis not present

## 2017-03-18 DIAGNOSIS — F1120 Opioid dependence, uncomplicated: Secondary | ICD-10-CM

## 2017-03-18 DIAGNOSIS — M545 Low back pain: Secondary | ICD-10-CM

## 2017-03-18 DIAGNOSIS — J441 Chronic obstructive pulmonary disease with (acute) exacerbation: Secondary | ICD-10-CM

## 2017-03-18 DIAGNOSIS — R7309 Other abnormal glucose: Secondary | ICD-10-CM

## 2017-03-18 LAB — BAYER DCA HB A1C WAIVED: HB A1C (BAYER DCA - WAIVED): 6 % (ref <7.0)

## 2017-03-18 MED ORDER — AZITHROMYCIN 250 MG PO TABS: ORAL_TABLET | ORAL | 0 refills | Status: DC

## 2017-03-18 MED ORDER — METHYLPREDNISOLONE ACETATE 80 MG/ML IJ SUSP
80.0000 mg | Freq: Once | INTRAMUSCULAR | Status: AC
  Administered 2017-03-18: 80 mg via INTRAMUSCULAR

## 2017-03-18 MED ORDER — CARISOPRODOL 350 MG PO TABS: 350.0000 mg | ORAL_TABLET | Freq: Two times a day (BID) | ORAL | 2 refills | Status: DC | PRN

## 2017-03-18 MED ORDER — OXYCODONE-ACETAMINOPHEN 10-325 MG PO TABS: 1.0000 | ORAL_TABLET | Freq: Two times a day (BID) | ORAL | 0 refills | Status: DC

## 2017-03-18 MED ORDER — OXYCODONE-ACETAMINOPHEN 10-325 MG PO TABS: 1.0000 | ORAL_TABLET | Freq: Two times a day (BID) | ORAL | 0 refills | Status: DC | PRN

## 2017-03-18 NOTE — Progress Notes (Signed)
BP 129/84   Pulse 74   Temp 98 F (36.7 C) (Oral)   Ht 6' (1.829 m)   Wt 293 lb (132.9 kg)   SpO2 95%   BMI 39.74 kg/m    Subjective:    Patient ID: Annia FriendlyJames R Wermuth, male    DOB: 08/25/1946, 71 y.o.   MRN: [id number]  HPI: Annia FriendlyJames R Fullilove is a 71 y.o. male presenting on 03/18/2017 for COPD (followup; patient experiencing chest congestion, burning in lungs, SOB); Back Pain; and Discuss neurosurgery referral   HPI Shortness of breath and wheezing Patient comes in because he has been having increased shortness of breath and coughing and wheezing. His cough has been productive of yellow-green sputum. He denies any fevers or chills. This been going on for the past 5 or 6 days. He says it initially started in his sinuses but now has worked its way down to his chest. He has been using his Combivent and his other inhalers says they're just not working as well as they usually do. He has been using his Combivent multiple times a day. He is also been using TheraFlu without much success.  Chronic low back pain Patient has chronic low back pain and feels like it's been worsening over the past few months, we tried to go to a different neurosurgeon here in [place] but he was unable to get in there because of the cost of getting records and staff. He would like to go back to his original neurosurgeon in [place] who is a Dr. Peyton NajjarLarry. He denies any radiation of the back pain down either of his legs or numbness or weakness at that point but the back pain is 7 out of 10 currently and the pain medications just are not working as well as they had previously.  Relevant past medical, surgical, family and social history reviewed and updated as indicated. Interim medical history since our last visit reviewed. Allergies and medications reviewed and updated.  Review of Systems  Constitutional: Negative for chills and fever.  HENT: Positive for congestion, postnasal drip, rhinorrhea, sinus pressure, sneezing and sore  throat. Negative for ear discharge, ear pain and voice change.   Eyes: Negative for pain, discharge, redness and visual disturbance.  Respiratory: Positive for cough, shortness of breath and wheezing.   Cardiovascular: Negative for chest pain and leg swelling.  Musculoskeletal: Positive for back pain. Negative for gait problem.  Skin: Negative for rash.  All other systems reviewed and are negative.   Per HPI unless specifically indicated above     Objective:    BP 129/84   Pulse 74   Temp 98 F (36.7 C) (Oral)   Ht 6' (1.829 m)   Wt 293 lb (132.9 kg)   SpO2 95%   BMI 39.74 kg/m   Wt Readings from Last 3 Encounters:  03/18/17 293 lb (132.9 kg)  12/17/16 290 lb (131.5 kg)  09/16/16 292 lb (132.5 kg)    Physical Exam  Constitutional: He is oriented to person, place, and time. He appears well-developed and well-nourished. No distress.  HENT:  Right Ear: Tympanic membrane, external ear and ear canal normal.  Left Ear: Tympanic membrane, external ear and ear canal normal.  Nose: Mucosal edema and rhinorrhea present. No sinus tenderness. No epistaxis. Right sinus exhibits maxillary sinus tenderness. Right sinus exhibits no frontal sinus tenderness. Left sinus exhibits maxillary sinus tenderness. Left sinus exhibits no frontal sinus tenderness.  Mouth/Throat: Uvula is midline and mucous membranes are normal. Posterior oropharyngeal  edema and posterior oropharyngeal erythema present. No oropharyngeal exudate or tonsillar abscesses.  Eyes: Conjunctivae are normal. No scleral icterus.  Neck: Neck supple.  Cardiovascular: Normal rate, regular rhythm, normal heart sounds and intact distal pulses.   No murmur heard. Pulmonary/Chest: Effort normal. No respiratory distress. He has wheezes. He has no rales.  Musculoskeletal: Normal range of motion. He exhibits tenderness (Bilateral and midline. Spinal and spinal tenderness in lumbar region. Nails straight leg raise bilaterally). He exhibits no  edema.  Lymphadenopathy:    He has no cervical adenopathy.  Neurological: He is alert and oriented to person, place, and time. Coordination normal.  Skin: Skin is warm and dry. No rash noted. He is not diaphoretic.  Psychiatric: He has a normal mood and affect. His behavior is normal.  Nursing note and vitals reviewed.     Assessment & Plan:   Problem List Items Addressed This Visit      Other   Lumbar back pain   Relevant Medications   oxyCODONE-acetaminophen (PERCOCET) 10-325 MG tablet   oxyCODONE-acetaminophen (PERCOCET) 10-325 MG tablet   oxyCODONE-acetaminophen (PERCOCET) 10-325 MG tablet   carisoprodol (SOMA) 350 MG tablet   methylPREDNISolone acetate (DEPO-MEDROL) injection 80 mg   Other Relevant Orders   Ambulatory referral to Neurosurgery    Other Visit Diagnoses    Elevated glucose    -  Primary   Relevant Orders   Bayer DCA Hb A1c Waived   Uncomplicated opioid dependence (HCC)       Relevant Medications   oxyCODONE-acetaminophen (PERCOCET) 10-325 MG tablet   oxyCODONE-acetaminophen (PERCOCET) 10-325 MG tablet   oxyCODONE-acetaminophen (PERCOCET) 10-325 MG tablet   COPD with acute exacerbation (HCC)       Relevant Medications   methylPREDNISolone acetate (DEPO-MEDROL) injection 80 mg   azithromycin (ZITHROMAX) 250 MG tablet      Patient is transferring pharmacies and will now start using mail in pharmacy rather than [facility].  Follow up plan: Return in about 3 months (around 06/18/2017), or if symptoms worsen or fail to improve, for pain and fasting labs.  Counseling provided for all of the vaccine components Orders Placed This Encounter  Procedures  . Bayer [facility] Hb A1c Waived    Arville Care, MD [facility] 03/18/2017, 8:38 AM

## 2017-03-23 ENCOUNTER — Other Ambulatory Visit: Payer: Self-pay | Admitting: *Deleted

## 2017-03-23 MED ORDER — METOPROLOL TARTRATE 25 MG PO TABS: ORAL_TABLET | ORAL | 2 refills | Status: DC

## 2017-03-25 ENCOUNTER — Other Ambulatory Visit: Payer: Self-pay | Admitting: Family Medicine

## 2017-03-25 DIAGNOSIS — G8929 Other chronic pain: Secondary | ICD-10-CM

## 2017-03-25 DIAGNOSIS — M545 Low back pain, unspecified: Secondary | ICD-10-CM

## 2017-04-01 ENCOUNTER — Ambulatory Visit (HOSPITAL_COMMUNITY)
Admission: RE | Admit: 2017-04-01 | Discharge: 2017-04-01 | Disposition: A | Discharge status: Home / Self Care | Payer: Medicare HMO | Source: Ambulatory Visit | Attending: Family Medicine | Admitting: Family Medicine

## 2017-04-01 DIAGNOSIS — Z9889 Other specified postprocedural states: Secondary | ICD-10-CM | POA: Diagnosis not present

## 2017-04-01 DIAGNOSIS — M545 Low back pain: Secondary | ICD-10-CM | POA: Insufficient documentation

## 2017-04-01 DIAGNOSIS — M5136 Other intervertebral disc degeneration, lumbar region: Secondary | ICD-10-CM | POA: Diagnosis not present

## 2017-04-01 DIAGNOSIS — G8929 Other chronic pain: Secondary | ICD-10-CM | POA: Insufficient documentation

## 2017-04-01 DIAGNOSIS — M47816 Spondylosis without myelopathy or radiculopathy, lumbar region: Secondary | ICD-10-CM | POA: Diagnosis not present

## 2017-04-01 DIAGNOSIS — M48061 Spinal stenosis, lumbar region without neurogenic claudication: Secondary | ICD-10-CM | POA: Diagnosis not present

## 2017-04-03 ENCOUNTER — Other Ambulatory Visit: Payer: Self-pay | Admitting: Family Medicine

## 2017-04-03 MED ORDER — FLUTICASONE-SALMETEROL 100-50 MCG/DOSE IN AEPB: 1.0000 | INHALATION_SPRAY | Freq: Two times a day (BID) | RESPIRATORY_TRACT | 3 refills | Status: DC

## 2017-04-03 NOTE — Telephone Encounter (Signed)
Not on med list, please advise and send back to the pools.

## 2017-04-03 NOTE — Telephone Encounter (Signed)
What is the name of the medication? Advair  Have you contacted your pharmacy to request a refill? YES  Which pharmacy would you like this sent to? [facility]   Patient notified that their request is being sent to the clinical staff for review and that they should receive a call once it is complete. If they do not receive a call within 24 hours they can check with their pharmacy or our office.

## 2017-04-03 NOTE — Telephone Encounter (Signed)
LM- rx requested sent to pharmacy.  

## 2017-04-14 ENCOUNTER — Other Ambulatory Visit: Payer: Self-pay | Admitting: Family Medicine

## 2017-04-14 DIAGNOSIS — J439 Emphysema, unspecified: Secondary | ICD-10-CM

## 2017-04-18 DIAGNOSIS — D869 Sarcoidosis, unspecified: Secondary | ICD-10-CM | POA: Diagnosis not present

## 2017-05-12 ENCOUNTER — Other Ambulatory Visit: Payer: Self-pay | Admitting: Family Medicine

## 2017-05-12 DIAGNOSIS — G8929 Other chronic pain: Secondary | ICD-10-CM

## 2017-05-12 DIAGNOSIS — I10 Essential (primary) hypertension: Secondary | ICD-10-CM | POA: Diagnosis not present

## 2017-05-12 DIAGNOSIS — R51 Headache: Secondary | ICD-10-CM | POA: Diagnosis not present

## 2017-05-12 DIAGNOSIS — M545 Low back pain, unspecified: Secondary | ICD-10-CM

## 2017-05-12 DIAGNOSIS — M4322 Fusion of spine, cervical region: Secondary | ICD-10-CM | POA: Diagnosis not present

## 2017-05-12 DIAGNOSIS — J309 Allergic rhinitis, unspecified: Secondary | ICD-10-CM

## 2017-05-12 DIAGNOSIS — M48062 Spinal stenosis, lumbar region with neurogenic claudication: Secondary | ICD-10-CM | POA: Diagnosis not present

## 2017-05-13 ENCOUNTER — Telehealth: Payer: Self-pay

## 2017-05-13 NOTE — Telephone Encounter (Signed)
Will approve prescriptions, I don't know if the Brian Harrington has to be called in or printed out but go ahead and do whichever needs to be done.

## 2017-05-13 NOTE — Telephone Encounter (Signed)
Okay thanks for the information 

## 2017-05-13 NOTE — Telephone Encounter (Signed)
We received a report from [facility], patient had done an FOBT card, his results were positive.  Dr. Louanne Skyeettinger recommended that patient have a colonoscopy.  The patient refuses, he said he has had hemorrhoids recently and had a colonoscopy 4-5 years ago in [place].  Patient was advised it was recommended that he repeat the colonoscopy to make sure there were no changes.  Patient declines.

## 2017-05-18 ENCOUNTER — Other Ambulatory Visit: Payer: Self-pay | Admitting: Family Medicine

## 2017-05-18 DIAGNOSIS — J439 Emphysema, unspecified: Secondary | ICD-10-CM

## 2017-05-18 DIAGNOSIS — D869 Sarcoidosis, unspecified: Secondary | ICD-10-CM | POA: Diagnosis not present

## 2017-05-22 DIAGNOSIS — M5021 Other cervical disc displacement,  high cervical region: Secondary | ICD-10-CM | POA: Diagnosis not present

## 2017-05-22 DIAGNOSIS — M4802 Spinal stenosis, cervical region: Secondary | ICD-10-CM | POA: Diagnosis not present

## 2017-05-22 DIAGNOSIS — M50222 Other cervical disc displacement at C5-C6 level: Secondary | ICD-10-CM | POA: Diagnosis not present

## 2017-05-22 DIAGNOSIS — M50221 Other cervical disc displacement at C4-C5 level: Secondary | ICD-10-CM | POA: Diagnosis not present

## 2017-05-22 DIAGNOSIS — M47812 Spondylosis without myelopathy or radiculopathy, cervical region: Secondary | ICD-10-CM | POA: Diagnosis not present

## 2017-05-22 DIAGNOSIS — M5023 Other cervical disc displacement, cervicothoracic region: Secondary | ICD-10-CM | POA: Diagnosis not present

## 2017-05-26 ENCOUNTER — Other Ambulatory Visit: Payer: Self-pay | Admitting: Family Medicine

## 2017-06-09 ENCOUNTER — Other Ambulatory Visit: Payer: Self-pay | Admitting: Family Medicine

## 2017-06-15 ENCOUNTER — Encounter: Payer: Self-pay | Admitting: Family Medicine

## 2017-06-15 ENCOUNTER — Ambulatory Visit (INDEPENDENT_AMBULATORY_CARE_PROVIDER_SITE_OTHER): Payer: Medicare HMO | Admitting: Family Medicine

## 2017-06-15 VITALS — BP 132/75 | HR 77 | Temp 97.1°F | Ht 72.0 in | Wt 286.4 lb

## 2017-06-15 DIAGNOSIS — I10 Essential (primary) hypertension: Secondary | ICD-10-CM | POA: Diagnosis not present

## 2017-06-15 DIAGNOSIS — G8929 Other chronic pain: Secondary | ICD-10-CM | POA: Diagnosis not present

## 2017-06-15 DIAGNOSIS — M545 Low back pain, unspecified: Secondary | ICD-10-CM

## 2017-06-15 DIAGNOSIS — J439 Emphysema, unspecified: Secondary | ICD-10-CM | POA: Diagnosis not present

## 2017-06-15 DIAGNOSIS — F1120 Opioid dependence, uncomplicated: Secondary | ICD-10-CM

## 2017-06-15 DIAGNOSIS — E7800 Pure hypercholesterolemia, unspecified: Secondary | ICD-10-CM | POA: Diagnosis not present

## 2017-06-15 DIAGNOSIS — R7303 Prediabetes: Secondary | ICD-10-CM

## 2017-06-15 DIAGNOSIS — W57XXXA Bitten or stung by nonvenomous insect and other nonvenomous arthropods, initial encounter: Secondary | ICD-10-CM

## 2017-06-15 LAB — BAYER DCA HB A1C WAIVED: HB A1C: 5.5 % (ref <7.0)

## 2017-06-15 MED ORDER — OXYCODONE-ACETAMINOPHEN 10-325 MG PO TABS: 1.0000 | ORAL_TABLET | Freq: Two times a day (BID) | ORAL | 0 refills | Status: DC | PRN

## 2017-06-15 MED ORDER — OXYCODONE-ACETAMINOPHEN 10-325 MG PO TABS: 1.0000 | ORAL_TABLET | Freq: Two times a day (BID) | ORAL | 0 refills | Status: DC

## 2017-06-15 MED ORDER — OXYCODONE-ACETAMINOPHEN 10-325 MG PO TABS
1.0000 | ORAL_TABLET | Freq: Two times a day (BID) | ORAL | 0 refills | Status: DC | PRN
Start: 2017-06-15 — End: 2017-06-15

## 2017-06-15 MED ORDER — OXYCODONE-ACETAMINOPHEN 10-325 MG PO TABS
1.0000 | ORAL_TABLET | Freq: Two times a day (BID) | ORAL | 0 refills | Status: DC
Start: 2017-06-15 — End: 2017-06-15

## 2017-06-15 NOTE — Progress Notes (Signed)
BP 132/75   Pulse 77   Temp 97.1 F (36.2 C) (Oral)   Ht 6' (1.829 m)   Wt 286 lb 6 oz (129.9 kg)   BMI 38.84 kg/m    Subjective:    Patient ID: Brian Harrington, male    DOB: 05/12/1946, 71 y.o.   MRN: [id number]  HPI: Brian Harrington is a 71 y.o. male presenting on 06/15/2017 for COPD; Hyperlipidemia; and Hypertension   HPI Prediabetes Patient comes in today for recheck of his prediabetes. Patient has been currently taking diet control. Patient is not currently on an ACE inhibitor. Patient has not seen an ophthalmologist this year. Patient denies any issues with his feet.   Hypertension Patient is currently on metoprolol, and her blood pressure today is 132/75. Patient denies any lightheadedness or dizziness. Patient denies headaches, blurred vision, chest pains, shortness of breath, or weakness. Denies any side effects from medication and is content with current medication.   Hyperlipidemia Patient is coming in for recheck of his hyperlipidemia. He is currently taking nothing because he has been intolerant of them in the past. He denies any issues with myalgias or history of liver damage from it. He denies any focal numbness or weakness or chest pain.   Chronic back pain Patient is coming in for refill of chronic pain meds. He says he is going in for her next surgery this month, next week and needs some extra of the pain medications to cover for the surgery time. He says otherwise his pain medications of been doing well for him. He denies any numbness or weakness but just has a lot of pain originating his neck and his lower back.  COPD recheck Patient is coming in for recheck of his COPD. He is currently on Symbicort and Combivent and incruse Ellipta. He says that his medications are working very well for him and he is not having to use the Combivent at all very frequently. He denies any wheezing or coughing today. He says he is feeling really well.  Tick bites Patient says his had  about 4 or 5 or more tick bites on his back over the past week. He cannot recall removing any takes problem did become a little bit infected which they then popped and it's been doing much better. He denies any fevers or chills or redness or warmth. He just has some small bumps where he feels like he is bitten by ticks.  Relevant past medical, surgical, family and social history reviewed and updated as indicated. Interim medical history since our last visit reviewed. Allergies and medications reviewed and updated.  Review of Systems  Constitutional: Negative for chills and fever.  HENT: Negative for congestion.   Eyes: Negative for discharge.  Respiratory: Positive for cough. Negative for shortness of breath and wheezing.   Cardiovascular: Negative for chest pain and leg swelling.  Musculoskeletal: Positive for back pain. Negative for gait problem.  Skin: Positive for color change. Negative for rash.  All other systems reviewed and are negative.   Per HPI unless specifically indicated above     Objective:    BP 132/75   Pulse 77   Temp 97.1 F (36.2 C) (Oral)   Ht 6' (1.829 m)   Wt 286 lb 6 oz (129.9 kg)   BMI 38.84 kg/m   Wt Readings from Last 3 Encounters:  06/15/17 286 lb 6 oz (129.9 kg)  03/18/17 293 lb (132.9 kg)  12/17/16 290 lb (131.5 kg)  Physical Exam  Constitutional: He is oriented to person, place, and time. He appears well-developed and well-nourished. No distress.  Eyes: Conjunctivae are normal. No scleral icterus.  Cardiovascular: Normal rate, regular rhythm, normal heart sounds and intact distal pulses.   No murmur heard. Pulmonary/Chest: Effort normal and breath sounds normal. No respiratory distress. He has no wheezes. He has no rales.  Musculoskeletal: Normal range of motion. He exhibits no edema.  Neurological: He is alert and oriented to person, place, and time. Coordination normal.  Skin: Skin is warm and dry. Rash (5-6 small pink papules on back and  one on back of neck. Patient thinks that he was bitten by a tick in these areas.) noted. He is not diaphoretic.  Psychiatric: He has a normal mood and affect. His behavior is normal.  Nursing note and vitals reviewed.   EKG: Normal sinus rhythm    Assessment & Plan:   Problem List Items Addressed This Visit      Cardiovascular and Mediastinum   HYPERTENSION, BENIGN   Relevant Orders   CMP14+EGFR   EKG 12-Lead (Completed)     Respiratory   COPD (chronic obstructive pulmonary disease) ([place]) - Primary   Relevant Orders   CBC with Differential/Platelet   EKG 12-Lead (Completed)     Other   Hypercholesterolemia   Relevant Orders   Lipid panel   EKG 12-Lead (Completed)    Other Visit Diagnoses    Uncomplicated opioid dependence ([facility])       Relevant Medications   oxyCODONE-acetaminophen (PERCOCET) 10-325 MG tablet   oxyCODONE-acetaminophen (PERCOCET) 10-325 MG tablet   oxyCODONE-acetaminophen (PERCOCET) 10-325 MG tablet   Other Relevant Orders   EKG 12-Lead (Completed)   Prediabetes       Relevant Orders   Bayer DCA Hb A1c Waived   EKG 12-Lead (Completed)   Tick bite, initial encounter       4 or 5 tick bites on back, no signs of infection or rash, will discuss with surgeon prior to surgery   Chronic bilateral low back pain without sciatica       Relevant Medications   oxyCODONE-acetaminophen (PERCOCET) 10-325 MG tablet   oxyCODONE-acetaminophen (PERCOCET) 10-325 MG tablet   oxyCODONE-acetaminophen (PERCOCET) 10-325 MG tablet      Patient said he needed extra of the pain medications because he is having surgery next week, will give 15 extra just this one time.   Follow up plan: Return in about 6 months (around 12/15/2017), or if symptoms worsen or fail to improve, for COPD cholesterol and hypertension recheck.  Counseling provided for all of the vaccine components Orders Placed This Encounter  Procedures  . CBC with Differential/Platelet  . CMP14+EGFR  . Lipid  panel  . Bayer DCA Hb A1c Waived  . EKG 12-Lead    Caryl Pina, MD [facility] 06/15/2017, 1:13 PM

## 2017-06-16 LAB — CBC WITH DIFFERENTIAL/PLATELET
Basophils Absolute: 0 10*3/uL (ref 0.0–0.2)
Basos: 1 %
EOS (ABSOLUTE): 0.2 10*3/uL (ref 0.0–0.4)
EOS: 3 %
HEMATOCRIT: 41.6 % (ref 37.5–51.0)
Hemoglobin: 14 g/dL (ref 13.0–17.7)
Immature Grans (Abs): 0 10*3/uL (ref 0.0–0.1)
Immature Granulocytes: 0 %
LYMPHS ABS: 1.7 10*3/uL (ref 0.7–3.1)
Lymphs: 31 %
MCH: 27.8 pg (ref 26.6–33.0)
MCHC: 33.7 g/dL (ref 31.5–35.7)
MCV: 83 fL (ref 79–97)
MONOS ABS: 0.4 10*3/uL (ref 0.1–0.9)
Monocytes: 7 %
Neutrophils Absolute: 3.3 10*3/uL (ref 1.4–7.0)
Neutrophils: 58 %
Platelets: 236 10*3/uL (ref 150–379)
RBC: 5.04 x10E6/uL (ref 4.14–5.80)
RDW: 15 % (ref 12.3–15.4)
WBC: 5.7 10*3/uL (ref 3.4–10.8)

## 2017-06-16 LAB — CMP14+EGFR
A/G RATIO: 1.9 (ref 1.2–2.2)
ALK PHOS: 82 IU/L (ref 39–117)
ALT: 26 IU/L (ref 0–44)
AST: 25 IU/L (ref 0–40)
Albumin: 4.4 g/dL (ref 3.6–4.8)
BILIRUBIN TOTAL: 0.5 mg/dL (ref 0.0–1.2)
BUN / CREAT RATIO: 15 (ref 10–24)
BUN: 16 mg/dL (ref 8–27)
CO2: 23 mmol/L (ref 20–29)
Calcium: 8.8 mg/dL (ref 8.6–10.2)
Chloride: 102 mmol/L (ref 96–106)
Creatinine, Ser: 1.06 mg/dL (ref 0.76–1.27)
GFR calc Af Amer: 83 mL/min/{1.73_m2} (ref >59)
GFR calc non Af Amer: 72 mL/min/{1.73_m2} (ref >59)
GLOBULIN, TOTAL: 2.3 g/dL (ref 1.5–4.5)
Glucose: 107 mg/dL — ABNORMAL HIGH (ref 65–99)
POTASSIUM: 3.8 mmol/L (ref 3.5–5.2)
SODIUM: 141 mmol/L (ref 134–144)
Total Protein: 6.7 g/dL (ref 6.0–8.5)

## 2017-06-16 LAB — LIPID PANEL
CHOL/HDL RATIO: 5 ratio (ref 0.0–5.0)
CHOLESTEROL TOTAL: 154 mg/dL (ref 100–199)
HDL: 31 mg/dL — ABNORMAL LOW (ref >39)
LDL Calculated: 88 mg/dL (ref 0–99)
TRIGLYCERIDES: 174 mg/dL — AB (ref 0–149)
VLDL Cholesterol Cal: 35 mg/dL (ref 5–40)

## 2017-06-17 DIAGNOSIS — I252 Old myocardial infarction: Secondary | ICD-10-CM | POA: Diagnosis not present

## 2017-06-17 DIAGNOSIS — Z01818 Encounter for other preprocedural examination: Secondary | ICD-10-CM | POA: Diagnosis not present

## 2017-06-17 DIAGNOSIS — M47812 Spondylosis without myelopathy or radiculopathy, cervical region: Secondary | ICD-10-CM | POA: Diagnosis not present

## 2017-06-17 DIAGNOSIS — I10 Essential (primary) hypertension: Secondary | ICD-10-CM | POA: Diagnosis not present

## 2017-06-17 DIAGNOSIS — I2510 Atherosclerotic heart disease of native coronary artery without angina pectoris: Secondary | ICD-10-CM | POA: Diagnosis not present

## 2017-06-17 DIAGNOSIS — J449 Chronic obstructive pulmonary disease, unspecified: Secondary | ICD-10-CM | POA: Diagnosis not present

## 2017-06-18 DIAGNOSIS — D869 Sarcoidosis, unspecified: Secondary | ICD-10-CM | POA: Diagnosis not present

## 2017-06-19 ENCOUNTER — Ambulatory Visit: Payer: Medicare HMO | Admitting: Family Medicine

## 2017-06-21 ENCOUNTER — Encounter: Payer: Self-pay | Admitting: Family Medicine

## 2017-06-23 HISTORY — PX: NECK SURGERY: SHX720

## 2017-06-24 DIAGNOSIS — E669 Obesity, unspecified: Secondary | ICD-10-CM | POA: Diagnosis not present

## 2017-06-24 DIAGNOSIS — J449 Chronic obstructive pulmonary disease, unspecified: Secondary | ICD-10-CM | POA: Diagnosis not present

## 2017-06-24 DIAGNOSIS — I252 Old myocardial infarction: Secondary | ICD-10-CM | POA: Diagnosis not present

## 2017-06-24 DIAGNOSIS — I10 Essential (primary) hypertension: Secondary | ICD-10-CM | POA: Diagnosis not present

## 2017-06-24 DIAGNOSIS — M4802 Spinal stenosis, cervical region: Secondary | ICD-10-CM | POA: Diagnosis not present

## 2017-06-24 DIAGNOSIS — G4733 Obstructive sleep apnea (adult) (pediatric): Secondary | ICD-10-CM | POA: Diagnosis not present

## 2017-06-24 DIAGNOSIS — G4730 Sleep apnea, unspecified: Secondary | ICD-10-CM | POA: Diagnosis not present

## 2017-06-24 DIAGNOSIS — Z4789 Encounter for other orthopedic aftercare: Secondary | ICD-10-CM | POA: Diagnosis not present

## 2017-06-24 DIAGNOSIS — M47812 Spondylosis without myelopathy or radiculopathy, cervical region: Secondary | ICD-10-CM | POA: Diagnosis not present

## 2017-06-24 DIAGNOSIS — Z981 Arthrodesis status: Secondary | ICD-10-CM | POA: Diagnosis not present

## 2017-06-24 DIAGNOSIS — F419 Anxiety disorder, unspecified: Secondary | ICD-10-CM | POA: Diagnosis not present

## 2017-06-24 DIAGNOSIS — E781 Pure hyperglyceridemia: Secondary | ICD-10-CM | POA: Diagnosis not present

## 2017-06-24 DIAGNOSIS — I2510 Atherosclerotic heart disease of native coronary artery without angina pectoris: Secondary | ICD-10-CM | POA: Diagnosis not present

## 2017-06-25 DIAGNOSIS — I252 Old myocardial infarction: Secondary | ICD-10-CM | POA: Diagnosis not present

## 2017-06-25 DIAGNOSIS — E669 Obesity, unspecified: Secondary | ICD-10-CM | POA: Diagnosis not present

## 2017-06-25 DIAGNOSIS — G4730 Sleep apnea, unspecified: Secondary | ICD-10-CM | POA: Diagnosis not present

## 2017-06-25 DIAGNOSIS — I10 Essential (primary) hypertension: Secondary | ICD-10-CM | POA: Diagnosis not present

## 2017-06-25 DIAGNOSIS — M47812 Spondylosis without myelopathy or radiculopathy, cervical region: Secondary | ICD-10-CM | POA: Diagnosis not present

## 2017-06-25 DIAGNOSIS — E781 Pure hyperglyceridemia: Secondary | ICD-10-CM | POA: Diagnosis not present

## 2017-06-25 DIAGNOSIS — I2510 Atherosclerotic heart disease of native coronary artery without angina pectoris: Secondary | ICD-10-CM | POA: Diagnosis not present

## 2017-06-25 DIAGNOSIS — F419 Anxiety disorder, unspecified: Secondary | ICD-10-CM | POA: Diagnosis not present

## 2017-06-25 DIAGNOSIS — J449 Chronic obstructive pulmonary disease, unspecified: Secondary | ICD-10-CM | POA: Diagnosis not present

## 2017-07-13 ENCOUNTER — Emergency Department (HOSPITAL_COMMUNITY): Payer: Medicare HMO

## 2017-07-13 ENCOUNTER — Encounter (HOSPITAL_COMMUNITY): Payer: Self-pay | Admitting: Emergency Medicine

## 2017-07-13 ENCOUNTER — Observation Stay (HOSPITAL_COMMUNITY)
Admission: EM | Admit: 2017-07-13 | Discharge: 2017-07-15 | Disposition: A | Discharge status: Home / Self Care | Payer: Medicare HMO | Attending: Internal Medicine | Admitting: Internal Medicine

## 2017-07-13 DIAGNOSIS — Z8673 Personal history of transient ischemic attack (TIA), and cerebral infarction without residual deficits: Secondary | ICD-10-CM | POA: Diagnosis not present

## 2017-07-13 DIAGNOSIS — G4733 Obstructive sleep apnea (adult) (pediatric): Secondary | ICD-10-CM | POA: Diagnosis not present

## 2017-07-13 DIAGNOSIS — M542 Cervicalgia: Secondary | ICD-10-CM | POA: Diagnosis not present

## 2017-07-13 DIAGNOSIS — R509 Fever, unspecified: Secondary | ICD-10-CM | POA: Diagnosis not present

## 2017-07-13 DIAGNOSIS — Z79899 Other long term (current) drug therapy: Secondary | ICD-10-CM | POA: Insufficient documentation

## 2017-07-13 DIAGNOSIS — J9621 Acute and chronic respiratory failure with hypoxia: Secondary | ICD-10-CM | POA: Diagnosis not present

## 2017-07-13 DIAGNOSIS — E871 Hypo-osmolality and hyponatremia: Secondary | ICD-10-CM | POA: Diagnosis not present

## 2017-07-13 DIAGNOSIS — I10 Essential (primary) hypertension: Secondary | ICD-10-CM | POA: Diagnosis not present

## 2017-07-13 DIAGNOSIS — R079 Chest pain, unspecified: Secondary | ICD-10-CM | POA: Diagnosis not present

## 2017-07-13 DIAGNOSIS — J441 Chronic obstructive pulmonary disease with (acute) exacerbation: Secondary | ICD-10-CM | POA: Diagnosis present

## 2017-07-13 DIAGNOSIS — Z87891 Personal history of nicotine dependence: Secondary | ICD-10-CM | POA: Diagnosis not present

## 2017-07-13 DIAGNOSIS — R739 Hyperglycemia, unspecified: Secondary | ICD-10-CM | POA: Diagnosis not present

## 2017-07-13 DIAGNOSIS — R05 Cough: Secondary | ICD-10-CM | POA: Diagnosis not present

## 2017-07-13 DIAGNOSIS — I2510 Atherosclerotic heart disease of native coronary artery without angina pectoris: Secondary | ICD-10-CM | POA: Insufficient documentation

## 2017-07-13 DIAGNOSIS — R0602 Shortness of breath: Secondary | ICD-10-CM | POA: Diagnosis present

## 2017-07-13 HISTORY — DX: Sarcoidosis of lung: D86.0

## 2017-07-13 LAB — BASIC METABOLIC PANEL
Anion gap: 10 (ref 5–15)
BUN: 26 mg/dL — AB (ref 6–20)
CHLORIDE: 96 mmol/L — AB (ref 101–111)
CO2: 24 mmol/L (ref 22–32)
CREATININE: 1.24 mg/dL (ref 0.61–1.24)
Calcium: 8.7 mg/dL — ABNORMAL LOW (ref 8.9–10.3)
GFR, EST NON AFRICAN AMERICAN: 58 mL/min — AB (ref >60)
Glucose, Bld: 118 mg/dL — ABNORMAL HIGH (ref 65–99)
POTASSIUM: 3.5 mmol/L (ref 3.5–5.1)
SODIUM: 130 mmol/L — AB (ref 135–145)

## 2017-07-13 LAB — CBC WITH DIFFERENTIAL/PLATELET
BASOS PCT: 0 %
Basophils Absolute: 0 10*3/uL (ref 0.0–0.1)
EOS ABS: 0 10*3/uL (ref 0.0–0.7)
EOS PCT: 0 %
HCT: 40.8 % (ref 39.0–52.0)
HEMOGLOBIN: 13.8 g/dL (ref 13.0–17.0)
Lymphocytes Relative: 8 %
Lymphs Abs: 0.9 10*3/uL (ref 0.7–4.0)
MCH: 27.9 pg (ref 26.0–34.0)
MCHC: 33.8 g/dL (ref 30.0–36.0)
MCV: 82.4 fL (ref 78.0–100.0)
Monocytes Absolute: 0.9 10*3/uL (ref 0.1–1.0)
Monocytes Relative: 8 %
NEUTROS PCT: 83 %
Neutro Abs: 8.8 10*3/uL — ABNORMAL HIGH (ref 1.7–7.7)
PLATELETS: 235 10*3/uL (ref 150–400)
RBC: 4.95 MIL/uL (ref 4.22–5.81)
RDW: 13.5 % (ref 11.5–15.5)
WBC: 10.5 10*3/uL (ref 4.0–10.5)

## 2017-07-13 MED ORDER — DOXYCYCLINE HYCLATE 100 MG PO TABS
100.0000 mg | ORAL_TABLET | Freq: Two times a day (BID) | ORAL | Status: DC
  Administered 2017-07-14 – 2017-07-15 (×4): 100 mg via ORAL
  Filled 2017-07-13 (×4): qty 1

## 2017-07-13 MED ORDER — METHYLPREDNISOLONE SODIUM SUCC 125 MG IJ SOLR
125.0000 mg | Freq: Once | INTRAMUSCULAR | Status: AC
  Administered 2017-07-13: 125 mg via INTRAVENOUS
  Filled 2017-07-13: qty 2

## 2017-07-13 MED ORDER — DOCUSATE SODIUM 100 MG PO CAPS
100.0000 mg | ORAL_CAPSULE | Freq: Two times a day (BID) | ORAL | Status: DC
  Administered 2017-07-14 – 2017-07-15 (×4): 100 mg via ORAL
  Filled 2017-07-13 (×4): qty 1

## 2017-07-13 MED ORDER — ALBUTEROL SULFATE (2.5 MG/3ML) 0.083% IN NEBU
5.0000 mg | INHALATION_SOLUTION | Freq: Once | RESPIRATORY_TRACT | Status: AC
  Administered 2017-07-13: 5 mg via RESPIRATORY_TRACT
  Filled 2017-07-13: qty 6

## 2017-07-13 MED ORDER — METOPROLOL TARTRATE 25 MG PO TABS
25.0000 mg | ORAL_TABLET | Freq: Two times a day (BID) | ORAL | Status: DC
  Administered 2017-07-14 – 2017-07-15 (×4): 25 mg via ORAL
  Filled 2017-07-13 (×4): qty 1

## 2017-07-13 MED ORDER — TIZANIDINE HCL 4 MG PO TABS
4.0000 mg | ORAL_TABLET | Freq: Four times a day (QID) | ORAL | Status: DC | PRN
  Administered 2017-07-14 (×2): 4 mg via ORAL
  Filled 2017-07-13 (×2): qty 1

## 2017-07-13 MED ORDER — ENOXAPARIN SODIUM 40 MG/0.4ML ~~LOC~~ SOLN
40.0000 mg | SUBCUTANEOUS | Status: DC
  Administered 2017-07-14 (×3): 40 mg via SUBCUTANEOUS
  Filled 2017-07-13 (×2): qty 0.4

## 2017-07-13 MED ORDER — ONDANSETRON HCL 4 MG/2ML IJ SOLN: 4.0000 mg | Freq: Four times a day (QID) | INTRAMUSCULAR | Status: DC | PRN

## 2017-07-13 MED ORDER — ONDANSETRON 4 MG PO TBDP
4.0000 mg | ORAL_TABLET | Freq: Once | ORAL | Status: AC
  Administered 2017-07-13: 4 mg via ORAL
  Filled 2017-07-13: qty 1

## 2017-07-13 MED ORDER — OXYCODONE-ACETAMINOPHEN 10-325 MG PO TABS: 1.0000 | ORAL_TABLET | Freq: Two times a day (BID) | ORAL | Status: DC | PRN

## 2017-07-13 MED ORDER — PANTOPRAZOLE SODIUM 40 MG PO TBEC
40.0000 mg | DELAYED_RELEASE_TABLET | Freq: Every day | ORAL | Status: DC
  Administered 2017-07-14 (×2): 40 mg via ORAL
  Filled 2017-07-13 (×2): qty 1

## 2017-07-13 MED ORDER — LORATADINE 10 MG PO TABS
10.0000 mg | ORAL_TABLET | Freq: Every day | ORAL | Status: DC
  Administered 2017-07-14 – 2017-07-15 (×2): 10 mg via ORAL
  Filled 2017-07-13 (×2): qty 1

## 2017-07-13 MED ORDER — PREDNISONE 20 MG PO TABS
40.0000 mg | ORAL_TABLET | Freq: Every day | ORAL | Status: DC
  Administered 2017-07-14: 40 mg via ORAL
  Filled 2017-07-13: qty 2

## 2017-07-13 MED ORDER — SODIUM CHLORIDE 0.9 % IV BOLUS (SEPSIS)
250.0000 mL | Freq: Once | INTRAVENOUS | Status: AC
  Administered 2017-07-13: 250 mL via INTRAVENOUS

## 2017-07-13 MED ORDER — ACETAMINOPHEN 325 MG PO TABS: 650.0000 mg | ORAL_TABLET | Freq: Four times a day (QID) | ORAL | Status: DC | PRN

## 2017-07-13 MED ORDER — IPRATROPIUM-ALBUTEROL 0.5-2.5 (3) MG/3ML IN SOLN
3.0000 mL | Freq: Four times a day (QID) | RESPIRATORY_TRACT | Status: DC
  Administered 2017-07-14 – 2017-07-15 (×6): 3 mL via RESPIRATORY_TRACT
  Filled 2017-07-13 (×7): qty 3

## 2017-07-13 MED ORDER — ONDANSETRON HCL 4 MG PO TABS
4.0000 mg | ORAL_TABLET | Freq: Four times a day (QID) | ORAL | Status: DC | PRN
  Administered 2017-07-14: 4 mg via ORAL
  Filled 2017-07-13 (×2): qty 1

## 2017-07-13 MED ORDER — ACETAMINOPHEN 650 MG RE SUPP: 650.0000 mg | Freq: Four times a day (QID) | RECTAL | Status: DC | PRN

## 2017-07-13 MED ORDER — MOMETASONE FURO-FORMOTEROL FUM 200-5 MCG/ACT IN AERO
2.0000 | INHALATION_SPRAY | Freq: Two times a day (BID) | RESPIRATORY_TRACT | Status: DC
  Administered 2017-07-14 – 2017-07-15 (×3): 2 via RESPIRATORY_TRACT
  Filled 2017-07-13: qty 8.8

## 2017-07-13 MED ORDER — ALBUTEROL SULFATE (2.5 MG/3ML) 0.083% IN NEBU: 2.5000 mg | INHALATION_SOLUTION | RESPIRATORY_TRACT | Status: DC | PRN

## 2017-07-13 MED ORDER — SODIUM CHLORIDE 0.9 % IV SOLN
INTRAVENOUS | Status: DC
  Administered 2017-07-13 – 2017-07-14 (×3): via INTRAVENOUS
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
## 2017-07-13 NOTE — ED Notes (Signed)
ED Provider at bedside. 

## 2017-07-13 NOTE — ED Notes (Signed)
admitting Provider at bedside. 
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
## 2017-07-13 NOTE — ED Triage Notes (Signed)
Pt c/o sob x 1 week. N/v last week but stopped. Started again today with n/v x "several". Pt is dry heaving in triage. Mild sob noted in triage. A/o

## 2017-07-14 ENCOUNTER — Encounter (HOSPITAL_COMMUNITY): Payer: Self-pay

## 2017-07-14 DIAGNOSIS — J441 Chronic obstructive pulmonary disease with (acute) exacerbation: Secondary | ICD-10-CM | POA: Diagnosis not present

## 2017-07-14 DIAGNOSIS — E871 Hypo-osmolality and hyponatremia: Secondary | ICD-10-CM | POA: Diagnosis not present

## 2017-07-14 DIAGNOSIS — J9621 Acute and chronic respiratory failure with hypoxia: Secondary | ICD-10-CM | POA: Diagnosis not present

## 2017-07-14 DIAGNOSIS — I10 Essential (primary) hypertension: Secondary | ICD-10-CM | POA: Diagnosis not present

## 2017-07-14 LAB — CBC
HCT: 39 % (ref 39.0–52.0)
HEMOGLOBIN: 12.7 g/dL — AB (ref 13.0–17.0)
MCH: 27.4 pg (ref 26.0–34.0)
MCHC: 32.6 g/dL (ref 30.0–36.0)
MCV: 84.1 fL (ref 78.0–100.0)
Platelets: 236 10*3/uL (ref 150–400)
RBC: 4.64 MIL/uL (ref 4.22–5.81)
RDW: 13.7 % (ref 11.5–15.5)
WBC: 8.6 10*3/uL (ref 4.0–10.5)

## 2017-07-14 LAB — BASIC METABOLIC PANEL
ANION GAP: 11 (ref 5–15)
BUN: 26 mg/dL — ABNORMAL HIGH (ref 6–20)
CALCIUM: 8.9 mg/dL (ref 8.9–10.3)
CHLORIDE: 97 mmol/L — AB (ref 101–111)
CO2: 27 mmol/L (ref 22–32)
CREATININE: 0.96 mg/dL (ref 0.61–1.24)
GFR calc Af Amer: >60 mL/min (ref >60)
GFR calc non Af Amer: >60 mL/min (ref >60)
GLUCOSE: 172 mg/dL — AB (ref 65–99)
Potassium: 3.9 mmol/L (ref 3.5–5.1)
Sodium: 135 mmol/L (ref 135–145)

## 2017-07-14 LAB — RAPID URINE DRUG SCREEN, HOSP PERFORMED
AMPHETAMINES: POSITIVE — AB
Barbiturates: NOT DETECTED
Benzodiazepines: NOT DETECTED
COCAINE: NOT DETECTED
OPIATES: NOT DETECTED
TETRAHYDROCANNABINOL: NOT DETECTED

## 2017-07-14 MED ORDER — OXYCODONE-ACETAMINOPHEN 5-325 MG PO TABS
1.0000 | ORAL_TABLET | Freq: Two times a day (BID) | ORAL | Status: DC | PRN
  Administered 2017-07-14: 1 via ORAL
  Filled 2017-07-14: qty 1

## 2017-07-14 MED ORDER — GUAIFENESIN ER 600 MG PO TB12
1200.0000 mg | ORAL_TABLET | Freq: Two times a day (BID) | ORAL | Status: DC
  Administered 2017-07-14 – 2017-07-15 (×2): 1200 mg via ORAL
  Filled 2017-07-14 (×2): qty 2

## 2017-07-14 MED ORDER — METHYLPREDNISOLONE SODIUM SUCC 125 MG IJ SOLR
60.0000 mg | Freq: Four times a day (QID) | INTRAMUSCULAR | Status: DC
  Administered 2017-07-14 – 2017-07-15 (×6): 60 mg via INTRAVENOUS
  Filled 2017-07-14 (×6): qty 2

## 2017-07-14 MED ORDER — OXYCODONE HCL 5 MG PO TABS
5.0000 mg | ORAL_TABLET | Freq: Two times a day (BID) | ORAL | Status: DC | PRN
  Administered 2017-07-14: 5 mg via ORAL
  Filled 2017-07-14: qty 1

## 2017-07-14 NOTE — Care Management Obs Status (Signed)
MEDICARE OBSERVATION STATUS NOTIFICATION   Patient Details  Name: Brian Harrington MRN: [id number] Date of Birth: 10/01/1946   Medicare Observation Status Notification Given:  Yes    Kiernan Atkerson, Chrystine OilerSharley Diane, RN 07/14/2017, 12:13 PM

## 2017-07-14 NOTE — Clinical Social Work Note (Signed)
Patient does not meet COPD gold criteria as he has not had three admissions in the past six months.   LCSW signing off.      Itxel Wickard, Juleen ChinaHeather D, LCSW

## 2017-07-14 NOTE — Progress Notes (Addendum)
Initial Nutrition Assessment  DOCUMENTATION CODES:   Obesity unspecified  INTERVENTION:  Regular diet   Provided Basics of healthy eating (My Plate) handout  NUTRITION DIAGNOSIS:    (obesity) related to  (related caloric intake exceeding requirements) as evidenced by  (BMI > 30).   GOAL:    (Pt will continue to modify food choice to promote gradual healthy wt loss)   MONITOR:  Meal intake, wt changes and labs     REASON FOR ASSESSMENT:   Consult COPD Protocol  ASSESSMENT: Brian Harrington has a hx of COPD. He presents with increased shortness of breath-acute on chronic respiratory failure. Chronic home O2- 2L.  Brian Harrington says he did not eat well at all for most of the week prior to admission due to his breathing difficulty. His wt is down 10# (4%) in 4 months which is a combination of making lifestyle changes (increasing fresh fruits and vegetables) and his current acute COPD exacerbation. Today he is eating 100% of meals is not complaining of nausea or vomiting. Patient would benefit from reducing overall wt by 5-10% (14-28#). Based on our conversation he understands what constitutes a healthy diet and has desire to make changes.  He is at risk for unplanned wt loss based chronic COPD but does not meet criteria for malnutrition at this time. Nutrition-Focused physical exam completed and WDL     Recent Labs Lab 07/13/17 1819 07/14/17 0613  NA 130* 135  K 3.5 3.9  CL 96* 97*  CO2 24 27  BUN 26* 26*  CREATININE 1.24 0.96  CALCIUM 8.7* 8.9  GLUCOSE 118* 172*   labs and meds reviewed  Diet Order:  Diet regular Room service appropriate? Yes; Fluid consistency: Thin  Skin:  Reviewed, no issues  Last BM:  unknown  Height:   Ht Readings from Last 1 Encounters:  07/13/17 6' (1.829 m)    Weight:   Wt Readings from Last 1 Encounters:  07/13/17 280 lb (127 kg)    Ideal Body Weight:   81 kg  BMI:  Body mass index is 37.97 kg/m.  Estimated Nutritional Needs:    Kcal:   2200-2400 kcal (reduce by 500 kcal daily to promote gradual wt loss)  Protein:   155-165 gr  Fluid:   2.2-2.4 liters daily  EDUCATION NEEDS:   Education needs addressed (provided Eat Right- My Plate handout)  Royann ShiversLynn Mylo Choi MS,RD,CSG,LDN Office: [phone] Pager: [phone]

## 2017-07-14 NOTE — Evaluation (Signed)
Physical Therapy Evaluation Patient Details Name: Brian Harrington MRN: [id number] DOB: 04/20/1946 Today's Date: 07/14/2017   History of Present Illness  Brian Harrington is a 71yo [facility] 2d SOB increased O2 need from 2LPM baseline to 5-6LPM. Pt admitted for COPD exacerbation. PMH: OSA not on CPAP, HTN, CAD, s/p recent ACDF C5-6 and hardward removal C6-7 (2-3WA). Pt lives alone, fully independent community dwlling adult, dons O2 prn/ad lib.   Clinical Impression  Pt admitted with above diagnosis. Pt currently with functional limitations due to the deficits listed below (see "PT Problem List"). Pt demonstrating independent mobility, amb without assistive device, with increased O2 flow rate required, no LOB. Gait speed in >0.9 suitable for safe access of the community and decreased risk of falls. Pt amb >47300ft will following vitals response: first 13950ft on 2LPM to 87%; 2nd 13550ft on 3LPM @ 93%; 3rd 16450ft on 3LPM @ 86%. Recommending continued amb ad lib in room, or in hallway with nursing staff with close monitoring of SpO2 at this time. No skilled PT needs at this time. PT signing off.     Follow Up Recommendations No PT follow up    Equipment Recommendations  None recommended by PT    Recommendations for Other Services       Precautions / Restrictions Precautions Precautions: None Restrictions Weight Bearing Restrictions: No      Mobility  Bed Mobility Overal bed mobility: Independent                Transfers Overall transfer level: Independent                  Ambulation/Gait Ambulation/Gait assistance: Independent Ambulation Distance (Feet): 350 Feet Assistive device: None   Gait velocity: 0.1118m/s Gait velocity interpretation: >2.62 ft/sec, indicative of independent community ambulator General Gait Details: first 15450ft on 2LPM to 87%; 2nd 1850ft on 3LPM @ 93%; 3rd 13250ft on 3LPM @ 86%.   Stairs            Wheelchair Mobility     Modified Rankin (Stroke Patients Only)       Balance Overall balance assessment: Independent                                           Pertinent Vitals/Pain Pain Assessment: No/denies pain    Home Living Family/patient expects to be discharged to:: Private residence Living Arrangements: Alone                    Prior Function Level of Independence: Independent               Hand Dominance        Extremity/Trunk Assessment   Upper Extremity Assessment Upper Extremity Assessment: Overall WFL for tasks assessed    Lower Extremity Assessment Lower Extremity Assessment: Overall WFL for tasks assessed    Cervical / Trunk Assessment Cervical / Trunk Assessment: Normal  Communication      Cognition Arousal/Alertness: Awake/alert Behavior During Therapy: WFL for tasks assessed/performed Overall Cognitive Status: Within Functional Limits for tasks assessed                                        General Comments      Exercises  Assessment/Plan    PT Assessment Patent does not need any further PT services  PT Problem List         PT Treatment Interventions      PT Goals (Current goals can be found in the Care Plan section)  Acute Rehab PT Goals PT Goal Formulation: All assessment and education complete, DC therapy    Frequency     Barriers to discharge        Co-evaluation               AM-PAC PT "6 Clicks" Daily Activity  Outcome Measure Difficulty turning over in bed (including adjusting bedclothes, sheets and blankets)?: None Difficulty moving from lying on back to sitting on the side of the bed? : None Difficulty sitting down on and standing up from a chair with arms (e.g., wheelchair, bedside commode, etc,.)?: None Help needed moving to and from a bed to chair (including a wheelchair)?: None Help needed walking in hospital room?: None Help needed climbing 3-5 steps with a railing? :  None 6 Click Score: 24    End of Session Equipment Utilized During Treatment: Gait belt;Oxygen Activity Tolerance: Patient tolerated treatment well Patient left: in chair;Other (comment) (2 extensions added to O2 tubing ) Nurse Communication: Mobility status      Time: 1610-9604 PT Time Calculation (min) (ACUTE ONLY): 15 min   Charges:   PT Evaluation $PT Eval Moderate Complexity: 1 Procedure PT Treatments $Therapeutic Activity: 8-22 mins   PT G Codes:   PT G-Codes **NOT FOR INPATIENT CLASS** Functional Assessment Tool Used: AM-PAC 6 Clicks Basic Mobility;Clinical judgement Functional Limitation: Mobility: Walking and moving around Mobility: Walking and Moving Around Current Status (V4098): 0 percent impaired, limited or restricted Mobility: Walking and Moving Around Goal Status (J1914): 0 percent impaired, limited or restricted Mobility: Walking and Moving Around Discharge Status [phone]): 0 percent impaired, limited or restricted    2:09 PM, 07/14/17 Rosamaria Lints, PT, DPT Physical Therapist - [place] [phone] (Office)   Buccola,Allan C 07/14/2017, 2:06 PM

## 2017-07-14 NOTE — Consult Note (Signed)
Consult requested by: [facility] Consult requested for: Acute on chronic hypoxic respiratory failure  HPI: This is a 71 year old who has significant past medical history of COPD. He also has sleep apnea but he is not compliant with his CPAP. He has hypertension coronary disease chronic pain and chronic hypoxic respiratory failure on 2 L of oxygen at home. He got sick several days ago and eventually got to the point where he felt like he could not breathe and couldn't get air in any came to the emergency department. He has been coughing up dark sputum. He is on good treatment at home. He denies any definite fever. He did have chest pain with cough. No nausea or vomiting. His chest is been tight. No abdominal pain no blurred vision no other new problems.  Past Medical History:  Diagnosis Date  . Arthritis   . Back pain, chronic   . CAD (coronary artery disease)   . COPD (chronic obstructive pulmonary disease) (HCC)    on 2L home O2  . Hypertension   . Sarcoidosis of lung (HCC)    reported by patient but no clear documentation of this condition  . Sleep apnea    no CPAP     Family History  Problem Relation Age of Onset  . CAD Neg Hx   He is unsure if there is COPD in his family   Social History   Social History  . Marital status: Divorced    Spouse name: N/A  . Number of children: 15  . Years of education: N/A   Occupational History  . Self employed-Farmer    Social History Main Topics  . Smoking status: Former Smoker    Packs/day: 1.00    Years: 25.00    Types: Cigarettes    Quit date: 09/23/1991  . Smokeless tobacco: Never Used  . Alcohol use 0.5 oz/week    1 Standard drinks or equivalent per week  . Drug use: Yes    Types: Marijuana     Comment: last use about 3 weeks ago  . Sexual activity: Not Asked   Other Topics Concern  . None   Social History Narrative   Patient lives in [place].     ROS: Except as mentioned 10 point review of systems is  negative    Objective: Vital signs in last 24 hours: Temp:  [97.5 F (36.4 C)-100.2 F (37.9 C)] 97.5 F (36.4 C) (07/17 0500) Pulse Rate:  [65-119] 65 (07/17 0500) Resp:  [16-30] 16 (07/17 0500) BP: (115-138)/(73-87) 120/73 (07/17 0500) SpO2:  [90 %-95 %] 94 % (07/17 0811) Weight:  [127 kg (280 lb)] 127 kg (280 lb) (07/16 1724) Weight change:     Intake/Output from previous day: No intake/output data recorded.  PHYSICAL EXAM Constitutional: He is awake and alert. He is somewhat obese. No acute distress. He is coughing during the examination. Eyes: Pupils react. EOMI. Ears nose mouth and throat: His mucous membranes are mildly dry. Hearing is grossly normal. No sinus tenderness. Cardiovascular: His heart is regular with normal heart sounds. Somewhat obscured by airway noise. No edema. Respiratory: His respiratory effort is normal but he is coughing. Lung exam shows that he has bilateral wheezes. Gastrointestinal: His abdomen is soft with no masses. Bowel sounds are present and active. Musculoskeletal: He has normal strength. Neurological: No focal abnormalities. Psychiatric: Normal mood and affect skin: Warm and dry  Lab Results: Basic Metabolic Panel:  Recent Labs  81/19/14 1819 07/14/17 0613  NA 130* 135  K 3.5 3.9  CL 96* 97*  CO2 24 27  GLUCOSE 118* 172*  BUN 26* 26*  CREATININE 1.24 0.96  CALCIUM 8.7* 8.9   Liver Function Tests: No results for input(s): AST, ALT, ALKPHOS, BILITOT, PROT, ALBUMIN in the last 72 hours. No results for input(s): LIPASE, AMYLASE in the last 72 hours. No results for input(s): AMMONIA in the last 72 hours. CBC:  Recent Labs  07/13/17 1819 07/14/17 0613  WBC 10.5 8.6  NEUTROABS 8.8*  --   HGB 13.8 12.7*  HCT 40.8 39.0  MCV 82.4 84.1  PLT 235 236   Cardiac Enzymes: No results for input(s): CKTOTAL, CKMB, CKMBINDEX, TROPONINI in the last 72 hours. BNP: No results for input(s): PROBNP in the last 72 hours. D-Dimer: No results  for input(s): DDIMER in the last 72 hours. CBG: No results for input(s): GLUCAP in the last 72 hours. Hemoglobin A1C: No results for input(s): HGBA1C in the last 72 hours. Fasting Lipid Panel: No results for input(s): CHOL, HDL, LDLCALC, TRIG, CHOLHDL, LDLDIRECT in the last 72 hours. Thyroid Function Tests: No results for input(s): TSH, T4TOTAL, FREET4, T3FREE, THYROIDAB in the last 72 hours. Anemia Panel: No results for input(s): VITAMINB12, FOLATE, FERRITIN, TIBC, IRON, RETICCTPCT in the last 72 hours. Coagulation: No results for input(s): LABPROT, INR in the last 72 hours. Urine Drug Screen: Drugs of Abuse     Component Value Date/Time   LABOPIA NONE DETECTED 07/14/2017 0010   COCAINSCRNUR NONE DETECTED 07/14/2017 0010   LABBENZ NONE DETECTED 07/14/2017 0010   AMPHETMU POSITIVE (A) 07/14/2017 0010   THCU NONE DETECTED 07/14/2017 0010   LABBARB NONE DETECTED 07/14/2017 0010    Alcohol Level: No results for input(s): ETH in the last 72 hours. Urinalysis: No results for input(s): COLORURINE, LABSPEC, PHURINE, GLUCOSEU, HGBUR, BILIRUBINUR, KETONESUR, PROTEINUR, UROBILINOGEN, NITRITE, LEUKOCYTESUR in the last 72 hours.  Invalid input(s): APPERANCEUR Misc. Labs:   ABGS: No results for input(s): PHART, PO2ART, TCO2, HCO3 in the last 72 hours.  Invalid input(s): PCO2   MICROBIOLOGY: No results found for this or any previous visit (from the past 240 hour(s)).  Studies/Results: Dg Chest 2 View  Result Date: 07/13/2017 CLINICAL DATA:  Dyspnea, cough and chest pain since Monday EXAM: CHEST  2 VIEW COMPARISON:  07/11/2016 FINDINGS: Mild diffuse interstitial prominence with peribronchial thickening consistent with acute bronchitic change. No pneumonic consolidation, effusion nor overt pulmonary edema. Slight hyperinflation of the left upper lobe. Heart is normal in size. No aortic aneurysm is noted. The patient is status post cervical ACDF. IMPRESSION: Mild diffuse increase in  interstitial prominence with interval slight peribronchial thickening suggestive of acute bronchitic change. Electronically Signed   By: Tollie Ethavid  Kwon M.D.   On: 07/13/2017 18:24    Medications:  Prior to Admission:  Prescriptions Prior to Admission  Medication Sig Dispense Refill Last Dose  . budesonide-formoterol (SYMBICORT) 160-4.5 MCG/ACT inhaler Inhale 2 puffs into the lungs 2 (two) times daily. 1 Inhaler 2 07/13/2017 at Unknown time  . carisoprodol (SOMA) 350 MG tablet Take 1 Tablet by mouth 2 times a day as needed (Patient taking differently: Take 1 Tablet by mouth 3 times a day as needed for muscle spasms) 60 tablet 2 07/12/2017 at Unknown time  . COMBIVENT RESPIMAT 20-100 MCG/ACT AERS respimat USE TWO puffs by mouthonce daily as instructed (Patient taking differently: USE TWO PUFFS INHALED UP TO 4 TIMES DAILY AS NEEDED FOR SHORTNESS OF BREATH) 4 g 2 07/13/2017 at Unknown time  . INCRUSE ELLIPTA  62.5 MCG/INH AEPB USE ONE inhalation ONCE daily 30 each 2 07/13/2017 at Unknown time  . loratadine (CLARITIN) 10 MG tablet Take 1 Tablet by mouth once daily 30 tablet 11 07/13/2017 at Unknown time  . metoprolol tartrate (LOPRESSOR) 25 MG tablet Take 1 Tablet by mouth 2 times a day 60 tablet 3 07/13/2017 at 7-800A  . oxyCODONE-acetaminophen (PERCOCET) 10-325 MG tablet Take 1 tablet by mouth 2 (two) times daily as needed for pain. Do not refill until 30 days from prescription date 75 tablet 0 07/13/2017 at Unknown time  . OXYGEN Inhale 2 L into the lungs continuous. 2 liters as needed    07/13/2017 at Unknown time  . oxymetazoline (AFRIN) 0.05 % nasal spray Place 1 spray into both nostrils 2 (two) times daily as needed for congestion.    07/13/2017 at Unknown time  . pantoprazole (PROTONIX) 40 MG tablet Take 1 Tablet by mouth once daily (Patient taking differently: take one tablet by mouth daily at bedtime) 30 tablet 3 07/12/2017 at Unknown time  . tiZANidine (ZANAFLEX) 4 MG tablet Take 1 Tablet by mouth every 6  hours as needed 30 tablet 2 unknowen   Scheduled: . docusate sodium  100 mg Oral BID  . doxycycline  100 mg Oral Q12H  . enoxaparin (LOVENOX) injection  40 mg Subcutaneous Q24H  . ipratropium-albuterol  3 mL Nebulization Q6H  . loratadine  10 mg Oral Daily  . methylPREDNISolone (SOLU-MEDROL) injection  60 mg Intravenous Q6H  . metoprolol tartrate  25 mg Oral BID  . mometasone-formoterol  2 puff Inhalation BID  . pantoprazole  40 mg Oral QHS   Continuous: . sodium chloride 75 mL/hr at 07/14/17 0236   JXB:[id number] **OR** acetaminophen, albuterol, ondansetron **OR** ondansetron (ZOFRAN) IV, oxyCODONE-acetaminophen **AND** oxyCODONE, tiZANidine  Assesment: He has COPD exacerbation and acute on chronic hypoxic respiratory failure. He is on prednisone but I think he needs to be on IV steroids. He is on oral antibiotics and that's probably okay depending on how he does. He is on good regimen at home but we might be able to simplify that when he is discharged. Principal Problem:   Acute on chronic respiratory failure with hypoxia (HCC) Active Problems:   HYPERTENSION, BENIGN   SLEEP APNEA, OBSTRUCTIVE   COPD with acute exacerbation (HCC)   Hyponatremia   Hyperglycemia   Neck pain    Plan: Switch to IV steroids. Continue other treatments. I will plan to follow him as an outpatient after he is discharged    LOS: 0 days   Amazin Pincock L 07/14/2017, 9:06 AM

## 2017-07-14 NOTE — Care Management Note (Signed)
Case Management Note  Patient Details  Name: Brian Harrington MRN: [id number] Date of Birth: 01/04/1946  Subjective/Objective:       Adm with acute on chronic respiratory failure. Has continuous oxygen set up if needed but wears mostly as needed. Ind PTA. Has port tank if needed for transport home. No HH needs. No issues affording medications.               Action/Plan: Plans to return home with self care.    Expected Discharge Date:  07/16/17               Expected Discharge Plan:  Home/Self Care  In-House Referral:     Discharge planning Services  CM Consult  Post Acute Care Choice:    Choice offered to:  NA  DME Arranged:    DME Agency:     HH Arranged:    HH Agency:     Status of Service:  Completed, signed off  If discussed at [employer] Length of Stay Meetings, dates discussed:    Additional Comments:  Ruben Mahler, Chrystine OilerSharley Diane, RN 07/14/2017, 2:27 PM

## 2017-07-14 NOTE — Progress Notes (Signed)
PROGRESS NOTE    Brian Harrington  WUJ:[id number] DOB: 03/01/1946 DOA: 07/13/2017 PCP: Dettinger, Elige RadonJoshua A, MD    Brief Narrative:  71 year old male with history of COPD on home O2, admitted to the hospital with shortness of breath felt to be related to COPD exacerbation. Start her on steroids, bronchodilators and antibiotics. Anticipate discharge home next 24 hours.   Assessment & Plan:   Principal Problem:   Acute on chronic respiratory failure with hypoxia (HCC) Active Problems:   HYPERTENSION, BENIGN   SLEEP APNEA, OBSTRUCTIVE   COPD with acute exacerbation (HCC)   Hyponatremia   Hyperglycemia   Neck pain   1. Acute on chronic respiratory failure. Patient requires 2 L of oxygen at home. Worsening shortness of breath likely related to COPD exacerbation. Continue current treatments. 2. COPD exacerbation. Appreciate pulmonology assistance. Prednisone changed Solu-Medrol. Since he is still wheezing and short of breath, will continue current treatments. Continue bronchodilators, antibiotics and pulmonary hygiene. 3. Hyponatremia. Related to volume depletion. Improved with saline. 4. Hypertension. Blood pressure stable. Continue metoprolol 5. GERD. Continue PPI   DVT prophylaxis: Lovenox Code Status: Full code Family Communication: No family present Disposition Plan: Discharge home once improved   Consultants:   Pulmonology  Procedures:     Antimicrobials:   Doxycycline 7/16>>   Subjective: Still feels short of breath not back to baseline. Has productive cough.  Objective: Vitals:   07/14/17 0900 07/14/17 1317 07/14/17 1359 07/14/17 1439  BP:  118/65    Pulse:  81 92   Resp:  20    Temp:  97.7 F (36.5 C)    TempSrc:  Oral    SpO2: 94% 95% (!) 86% 92%  Weight:      Height:        Intake/Output Summary (Last 24 hours) at 07/14/17 1840 Last data filed at 07/14/17 1800  Gross per 24 hour  Intake          2116.25 ml  Output                0 ml  Net           2116.25 ml   Filed Weights   07/13/17 1724  Weight: 127 kg (280 lb)    Examination:  General exam: Appears calm and comfortable  Respiratory system: bilateral wheezes. Respiratory effort normal. Cardiovascular system: S1 & S2 heard, RRR. No JVD, murmurs, rubs, gallops or clicks. No pedal edema. Gastrointestinal system: Abdomen is nondistended, soft and nontender. No organomegaly or masses felt. Normal bowel sounds heard. Central nervous system: Alert and oriented. No focal neurological deficits. Extremities: Symmetric 5 x 5 power. Skin: No rashes, lesions or ulcers Psychiatry: Judgement and insight appear normal. Mood & affect appropriate.     Data Reviewed: I have personally reviewed following labs and imaging studies  CBC:  Recent Labs Lab 07/13/17 1819 07/14/17 0613  WBC 10.5 8.6  NEUTROABS 8.8*  --   HGB 13.8 12.7*  HCT 40.8 39.0  MCV 82.4 84.1  PLT 235 236   Basic Metabolic Panel:  Recent Labs Lab 07/13/17 1819 07/14/17 0613  NA 130* 135  K 3.5 3.9  CL 96* 97*  CO2 24 27  GLUCOSE 118* 172*  BUN 26* 26*  CREATININE 1.24 0.96  CALCIUM 8.7* 8.9   GFR: Estimated Creatinine Clearance: 101.5 mL/min (by C-G formula based on SCr of 0.96 mg/dL). Liver Function Tests: No results for input(s): AST, ALT, ALKPHOS, BILITOT, PROT, ALBUMIN in the last 168 hours.  No results for input(s): LIPASE, AMYLASE in the last 168 hours. No results for input(s): AMMONIA in the last 168 hours. Coagulation Profile: No results for input(s): INR, PROTIME in the last 168 hours. Cardiac Enzymes: No results for input(s): CKTOTAL, CKMB, CKMBINDEX, TROPONINI in the last 168 hours. BNP (last 3 results) No results for input(s): PROBNP in the last 8760 hours. HbA1C: No results for input(s): HGBA1C in the last 72 hours. CBG: No results for input(s): GLUCAP in the last 168 hours. Lipid Profile: No results for input(s): CHOL, HDL, LDLCALC, TRIG, CHOLHDL, LDLDIRECT in the last 72  hours. Thyroid Function Tests: No results for input(s): TSH, T4TOTAL, FREET4, T3FREE, THYROIDAB in the last 72 hours. Anemia Panel: No results for input(s): VITAMINB12, FOLATE, FERRITIN, TIBC, IRON, RETICCTPCT in the last 72 hours. Sepsis Labs: No results for input(s): PROCALCITON, LATICACIDVEN in the last 168 hours.  No results found for this or any previous visit (from the past 240 hour(s)).       Radiology Studies: Dg Chest 2 View  Result Date: 07/13/2017 CLINICAL DATA:  Dyspnea, cough and chest pain since Monday EXAM: CHEST  2 VIEW COMPARISON:  07/11/2016 FINDINGS: Mild diffuse interstitial prominence with peribronchial thickening consistent with acute bronchitic change. No pneumonic consolidation, effusion nor overt pulmonary edema. Slight hyperinflation of the left upper lobe. Heart is normal in size. No aortic aneurysm is noted. The patient is status post cervical ACDF. IMPRESSION: Mild diffuse increase in interstitial prominence with interval slight peribronchial thickening suggestive of acute bronchitic change. Electronically Signed   By: Tollie Eth M.D.   On: 07/13/2017 18:24        Scheduled Meds: . docusate sodium  100 mg Oral BID  . doxycycline  100 mg Oral Q12H  . enoxaparin (LOVENOX) injection  40 mg Subcutaneous Q24H  . ipratropium-albuterol  3 mL Nebulization Q6H  . loratadine  10 mg Oral Daily  . methylPREDNISolone (SOLU-MEDROL) injection  60 mg Intravenous Q6H  . metoprolol tartrate  25 mg Oral BID  . mometasone-formoterol  2 puff Inhalation BID  . pantoprazole  40 mg Oral QHS   Continuous Infusions: . sodium chloride 75 mL/hr at 07/14/17 1713     LOS: 0 days    Time spent:    MEMON,JEHANZEB, MD [facility] Pager [phone]  If 7PM-7AM, please contact night-coverage [email or web address] Password [facility] 07/14/2017, 6:40 PM

## 2017-07-14 NOTE — Progress Notes (Signed)
OT Cancellation Note  Patient Details Name: Brian Harrington MRN: [id number] DOB: 12/28/1946   Cancelled Treatment:    Reason Eval/Treat Not Completed: OT screened, no needs identified, will sign off. Pt is at baseline independence with functional task completion, no further OT services required at this time.    Ezra SitesLeslie Genaro Bekker, OTR/L  [phone] 07/14/2017, 9:01 AM

## 2017-07-15 ENCOUNTER — Encounter: Payer: Self-pay | Admitting: Family Medicine

## 2017-07-15 DIAGNOSIS — I10 Essential (primary) hypertension: Secondary | ICD-10-CM

## 2017-07-15 DIAGNOSIS — J9621 Acute and chronic respiratory failure with hypoxia: Secondary | ICD-10-CM | POA: Diagnosis not present

## 2017-07-15 DIAGNOSIS — E871 Hypo-osmolality and hyponatremia: Secondary | ICD-10-CM | POA: Diagnosis not present

## 2017-07-15 DIAGNOSIS — J441 Chronic obstructive pulmonary disease with (acute) exacerbation: Secondary | ICD-10-CM | POA: Diagnosis not present

## 2017-07-15 DIAGNOSIS — N179 Acute kidney failure, unspecified: Secondary | ICD-10-CM

## 2017-07-15 DIAGNOSIS — J449 Chronic obstructive pulmonary disease, unspecified: Secondary | ICD-10-CM | POA: Diagnosis not present

## 2017-07-15 MED ORDER — SALINE SPRAY 0.65 % NA SOLN
1.0000 | NASAL | Status: DC | PRN
  Administered 2017-07-15: 1 via NASAL
  Filled 2017-07-15: qty 44

## 2017-07-15 MED ORDER — SALINE SPRAY 0.65 % NA SOLN: 1.0000 | NASAL | 0 refills | Status: DC | PRN

## 2017-07-15 MED ORDER — PREDNISONE 20 MG PO TABS: 20.0000 mg | ORAL_TABLET | Freq: Every day | ORAL | 0 refills | Status: AC

## 2017-07-15 MED ORDER — ALBUTEROL SULFATE (2.5 MG/3ML) 0.083% IN NEBU: 2.5000 mg | INHALATION_SOLUTION | Freq: Four times a day (QID) | RESPIRATORY_TRACT | 12 refills | Status: DC | PRN

## 2017-07-15 NOTE — Discharge Summary (Signed)
Physician Discharge Summary  Brian Harrington ZOX:[id number] DOB: 01/31/1946 DOA: 07/13/2017  PCP: Dettinger, Elige Radon, MD  Admit date: 07/13/2017 Discharge date: 07/15/2017  Admitted From: Home  Disposition:  Home   Recommendations for Outpatient Follow-up:  1. Follow up with PCP in 1- weeks 2. Please follow with Dr. Juanetta Gosling as scheduled 3. Patient placed on albuterol nebs/ nebulizer machine 4. Will get long acting anticholinergic sample from Dr. Juanetta Gosling office at discharge 5. Take prednisone for 3 more days.   Home Health: No  Equipment/Devices: Home 02 2 LPM  Discharge Condition: stable  CODE STATUS: Full  Diet recommendation: Heart Healthy  Brief/Interim Summary: 71 year old male who presented with dyspnea. Patient will have OSA, hypertension, coronary disease, COPD with chronic hypoxic respiratory failure. He had worsening dyspnea for last 7 days prior to hospitalization, to the point where he was symptomatic with minimal efforts, associated with wheezing, vomiting and productive cough. On initial physical examination, blood pressure 126/82, heart rate 119, respiratory 20, oxygen saturation 92%. Mucous members were moist, heart S1-S2 present tachycardic, lungs with diffuse wheezing and scattered rhonchi, abdomen soft nontender, no lower extremity edema. Sodium 1:30, potassium 3.5, chloride 96, bicarbonate 24, glucose 118, BUN 26, creatinine 1.24, white count 10.5, hemoglobin 13.8, hematocrit 40.8, platelets 235, chest x-ray with increase lung markings bilaterally, interstitial infiltrates.   Patient was admitted to the hospital with the working diagnosis of acute on chronic hypoxic respiratory failure due to COPD exacerbation.  1. Acute on chronic hypoxic respiratory failure due to COPD exacerbation. Patient was admitted to the medical unit, he was placed on systemic steroids, aggressive bronchodilator therapy and antibiotic therapy with doxycycline. Patient responded well to the medical  therapy, he was deemed to be stable for discharge on July 18. Continue supplemental oxygen per nasal cannula at home, he will be placed on long-acting anticholinergic agent, follow-up with the pulmonary clinic. Take prednisone for 3 more days.   2. Dehydration with Hyponatremia. Admission sodium 130, presumed to be related to dehydration, discharge sodium 135.  3. Acute kidney injury. Patient received IV fluids with improvement of kidney function, creatinine discharge 0.96  4. Hypertension. Patient continue metoprolol no major complications.  5. Chronic pain syndrome. Continue oxycodone and Soma.   Discharge Diagnoses:  Principal Problem:   Acute on chronic respiratory failure with hypoxia (HCC) Active Problems:   HYPERTENSION, BENIGN   SLEEP APNEA, OBSTRUCTIVE   COPD with acute exacerbation (HCC)   Hyponatremia   Hyperglycemia   Neck pain    Discharge Instructions   Allergies as of 07/15/2017      Reactions   Amlodipine Besylate    REACTION: tongue and facial swelling, difficulty breathing   Sulfa Antibiotics Nausea And Vomiting   Sulfonamide Derivatives       Medication List    STOP taking these medications   COMBIVENT RESPIMAT 20-100 MCG/ACT Aers respimat Generic drug:  Ipratropium-Albuterol   INCRUSE ELLIPTA 62.5 MCG/INH Aepb Generic drug:  umeclidinium bromide     TAKE these medications   albuterol (2.5 MG/3ML) 0.083% nebulizer solution Commonly known as:  PROVENTIL Take 3 mLs (2.5 mg total) by nebulization every 6 (six) hours as needed for wheezing or shortness of breath.   budesonide-formoterol 160-4.5 MCG/ACT inhaler Commonly known as:  SYMBICORT Inhale 2 puffs into the lungs 2 (two) times daily.   carisoprodol 350 MG tablet Commonly known as:  SOMA Take 1 Tablet by mouth 2 times a day as needed What changed:  See the new instructions.  loratadine 10 MG tablet Commonly known as:  CLARITIN Take 1 Tablet by mouth once daily   metoprolol tartrate 25  MG tablet Commonly known as:  LOPRESSOR Take 1 Tablet by mouth 2 times a day   oxyCODONE-acetaminophen 10-325 MG tablet Commonly known as:  PERCOCET Take 1 tablet by mouth 2 (two) times daily as needed for pain. Do not refill until 30 days from prescription date   OXYGEN Inhale 2 L into the lungs continuous. 2 liters as needed   oxymetazoline 0.05 % nasal spray Commonly known as:  AFRIN Place 1 spray into both nostrils 2 (two) times daily as needed for congestion.   pantoprazole 40 MG tablet Commonly known as:  PROTONIX Take 1 Tablet by mouth once daily What changed:  See the new instructions.   predniSONE 20 MG tablet Commonly known as:  DELTASONE Take 1 tablet (20 mg total) by mouth daily.   tiZANidine 4 MG tablet Commonly known as:  ZANAFLEX Take 1 Tablet by mouth every 6 hours as needed            Durable Medical Equipment        Start     Ordered   07/15/17 0931  For home use only DME Nebulizer machine  Once    Question:  Patient needs a nebulizer to treat with the following condition  Answer:  COPD (chronic obstructive pulmonary disease) (HCC)   07/15/17 0930     Follow-up Information    Kari BaarsHawkins, Edward, MD Follow up.   Specialty:  Pulmonary Disease Why:  hospital follow-up Contact information: [address] [place] [id number] [phone]          Allergies  Allergen Reactions  . Amlodipine Besylate     REACTION: tongue and facial swelling, difficulty breathing  . Sulfa Antibiotics Nausea And Vomiting  . Sulfonamide Derivatives     Consultations:  Pulmonary    Procedures/Studies: Dg Chest 2 View  Result Date: 07/13/2017 CLINICAL DATA:  Dyspnea, cough and chest pain since Monday EXAM: CHEST  2 VIEW COMPARISON:  07/11/2016 FINDINGS: Mild diffuse interstitial prominence with peribronchial thickening consistent with acute bronchitic change. No pneumonic consolidation, effusion nor overt pulmonary edema. Slight  hyperinflation of the left upper lobe. Heart is normal in size. No aortic aneurysm is noted. The patient is status post cervical ACDF. IMPRESSION: Mild diffuse increase in interstitial prominence with interval slight peribronchial thickening suggestive of acute bronchitic change. Electronically Signed   By: Tollie Ethavid  Kwon M.D.   On: 07/13/2017 18:24       Subjective: Patient feeling better, no nausea or vomiting, dyspnea has been improving, feeling close to his baseline.   Discharge Exam: Vitals:   07/14/17 2138 07/15/17 0500  BP: 113/69 122/72  Pulse: 91 80  Resp: 18 18  Temp: 97.8 F (36.6 C) 97.6 F (36.4 C)   Vitals:   07/15/17 0215 07/15/17 0500 07/15/17 0804 07/15/17 0809  BP:  122/72    Pulse:  80    Resp:  18    Temp:  97.6 F (36.4 C)    TempSrc:  Oral    SpO2: 94% 93% 94% 94%  Weight:      Height:        General: Pt is alert, awake, not in acute distress E ENT: mild pallor, no icterus, oral mucosa moist.  Cardiovascular: RRR, S1/S2 +, no rubs, no gallops Respiratory: CTA bilaterally, no significant wheezing, scattered  rhonchi Abdominal: Soft, NT, ND, bowel sounds +  Extremities: no edema, no cyanosis    The results of significant diagnostics from this hospitalization (including imaging, microbiology, ancillary and laboratory) are listed below for reference.     Microbiology: No results found for this or any previous visit (from the past 240 hour(s)).   Labs: BNP (last 3 results) No results for input(s): BNP in the last 8760 hours. Basic Metabolic Panel:  Recent Labs Lab 07/13/17 1819 07/14/17 0613  NA 130* 135  K 3.5 3.9  CL 96* 97*  CO2 24 27  GLUCOSE 118* 172*  BUN 26* 26*  CREATININE 1.24 0.96  CALCIUM 8.7* 8.9   Liver Function Tests: No results for input(s): AST, ALT, ALKPHOS, BILITOT, PROT, ALBUMIN in the last 168 hours. No results for input(s): LIPASE, AMYLASE in the last 168 hours. No results for input(s): AMMONIA in the last 168  hours. CBC:  Recent Labs Lab 07/13/17 1819 07/14/17 0613  WBC 10.5 8.6  NEUTROABS 8.8*  --   HGB 13.8 12.7*  HCT 40.8 39.0  MCV 82.4 84.1  PLT 235 236   Cardiac Enzymes: No results for input(s): CKTOTAL, CKMB, CKMBINDEX, TROPONINI in the last 168 hours. BNP: Invalid input(s): POCBNP CBG: No results for input(s): GLUCAP in the last 168 hours. D-Dimer No results for input(s): DDIMER in the last 72 hours. Hgb A1c No results for input(s): HGBA1C in the last 72 hours. Lipid Profile No results for input(s): CHOL, HDL, LDLCALC, TRIG, CHOLHDL, LDLDIRECT in the last 72 hours. Thyroid function studies No results for input(s): TSH, T4TOTAL, T3FREE, THYROIDAB in the last 72 hours.  Invalid input(s): FREET3 Anemia work up No results for input(s): VITAMINB12, FOLATE, FERRITIN, TIBC, IRON, RETICCTPCT in the last 72 hours. Urinalysis No results found for: COLORURINE, APPEARANCEUR, LABSPEC, PHURINE, GLUCOSEU, HGBUR, BILIRUBINUR, KETONESUR, PROTEINUR, UROBILINOGEN, NITRITE, LEUKOCYTESUR Sepsis Labs Invalid input(s): PROCALCITONIN,  WBC,  LACTICIDVEN Microbiology No results found for this or any previous visit (from the past 240 hour(s)).   Time coordinating discharge: 45 minutes  SIGNED:   Coralie Keens, MD  [facility] 07/15/2017, 11:26 AM Pager   If 7PM-7AM, please contact night-coverage [email or web address] Password [email or web address]

## 2017-07-15 NOTE — Care Management (Signed)
Patient will need a neb machine. Would like [facility] to deliver to his house. Rep Fayrene FearingJames notified and all necessary information/order faxed.

## 2017-07-15 NOTE — Progress Notes (Signed)
Patient's IV removed.  Site WNL.  AVS reviewed with patient.  Verbalized understanding of discharge instructions, physician follow-up, medications.  Patient transported by NT via w/c to main entrance at discharge.  Patient's family brought home oxygen for transport home.  [facility] to deliver home nebulizer.  Patient stable at time of discharge.

## 2017-07-15 NOTE — Progress Notes (Signed)
He says he thinks he is going to be able to go home today. He is still coughing some but not short of breath like he was. He wants to have a nebulizer at home so I will discuss with discharge planners. I would put him on albuterol rather than DuoNeb because he's going to be on a long-acting anticholinergic. He will come by my office to get a sample of the new inhaler Trelegy and I will plan to follow him as an outpatient. I will sign off at this point  Thanks for allowing me to see him with you

## 2017-07-17 ENCOUNTER — Telehealth: Payer: Self-pay | Admitting: *Deleted

## 2017-07-17 NOTE — Telephone Encounter (Signed)
We can authorize refill of Zanaflex but due to the controlled nature of the Soma we can authorize that one until he is seen. He needs to come in anyways for a hospital follow-up and we will discuss that then

## 2017-07-17 NOTE — Telephone Encounter (Signed)
Call Completed and Appointment Scheduled: Yes, Date: 07/23/17 with Dr Dettinger   DISCHARGE INFORMATION Date of Discharge:07/15/17  [facility]  Principal Discharge Diagnosis: COPD exacerbation  Patient and/or caregiver is knowledgeable of his/her condition(s) and treatment: Yes  MEDICATION RECONCILIATION Current medication list reviewed with patient:Yes  Outpatient Encounter Prescriptions as of 07/17/2017  Medication Sig  . albuterol (PROVENTIL) (2.5 MG/3ML) 0.083% nebulizer solution Take 3 mLs (2.5 mg total) by nebulization every 6 (six) hours as needed for wheezing or shortness of breath.  . budesonide-formoterol (SYMBICORT) 160-4.5 MCG/ACT inhaler Inhale 2 puffs into the lungs 2 (two) times daily.  . carisoprodol (SOMA) 350 MG tablet Take 1 Tablet by mouth 2 times a day as needed (Patient taking differently: Take 1 Tablet by mouth 3 times a day as needed for muscle spasms)  . loratadine (CLARITIN) 10 MG tablet Take 1 Tablet by mouth once daily  . metoprolol tartrate (LOPRESSOR) 25 MG tablet Take 1 Tablet by mouth 2 times a day  . oxyCODONE-acetaminophen (PERCOCET) 10-325 MG tablet Take 1 tablet by mouth 2 (two) times daily as needed for pain. Do not refill until 30 days from prescription date  . OXYGEN Inhale 2 L into the lungs continuous. 2 liters as needed   . oxymetazoline (AFRIN) 0.05 % nasal spray Place 1 spray into both nostrils 2 (two) times daily as needed for congestion.   . pantoprazole (PROTONIX) 40 MG tablet Take 1 Tablet by mouth once daily (Patient taking differently: take one tablet by mouth daily at bedtime)  . predniSONE (DELTASONE) 20 MG tablet Take 1 tablet (20 mg total) by mouth daily.  . sodium chloride (OCEAN) 0.65 % SOLN nasal spray Place 1 spray into both nostrils as needed for congestion.  Marland Kitchen. tiZANidine (ZANAFLEX) 4 MG tablet Take 1 Tablet by mouth every 6 hours as needed   No facility-administered encounter medications on file as of  07/17/2017.     Discharge Medications reviewed and reconciled with current medications.yes  Patient is able to obtain needed medications:Yes  ACTIVITIES OF DAILY LIVING  Is the patient able to perform his/her own ADLs: Yes  Patient is receiving home health services: No.  PATIENT EDUCATION Questions/Concerns Discussed: No questions or concerns related to this at this time.
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
## 2017-07-18 DIAGNOSIS — D869 Sarcoidosis, unspecified: Secondary | ICD-10-CM | POA: Diagnosis not present

## 2017-07-18 MED ORDER — TIZANIDINE HCL 4 MG PO TABS: 4.0000 mg | ORAL_TABLET | Freq: Four times a day (QID) | ORAL | 2 refills | Status: DC | PRN

## 2017-07-18 NOTE — Telephone Encounter (Signed)
Refilled zanaflex for pt = I have not called pt yet

## 2017-07-20 NOTE — Telephone Encounter (Signed)
pt aware - he states he has an appt this week already.

## 2017-07-23 ENCOUNTER — Telehealth: Payer: Self-pay | Admitting: Family Medicine

## 2017-07-23 ENCOUNTER — Ambulatory Visit (INDEPENDENT_AMBULATORY_CARE_PROVIDER_SITE_OTHER): Payer: Medicare HMO | Admitting: Family Medicine

## 2017-07-23 ENCOUNTER — Encounter: Payer: Self-pay | Admitting: Family Medicine

## 2017-07-23 VITALS — BP 123/78 | HR 71 | Temp 97.2°F | Ht 72.0 in | Wt 266.0 lb

## 2017-07-23 DIAGNOSIS — J441 Chronic obstructive pulmonary disease with (acute) exacerbation: Secondary | ICD-10-CM

## 2017-07-23 MED ORDER — PREDNISONE 20 MG PO TABS: ORAL_TABLET | ORAL | 0 refills | Status: DC

## 2017-07-23 MED ORDER — TIZANIDINE HCL 4 MG PO TABS: 4.0000 mg | ORAL_TABLET | Freq: Four times a day (QID) | ORAL | 2 refills | Status: DC | PRN

## 2017-07-23 NOTE — Progress Notes (Signed)
BP 123/78   Pulse 71   Temp (!) 97.2 F (36.2 C) (Oral)   Ht 6' (1.829 m)   Wt 266 lb (120.7 kg)   BMI 36.08 kg/m    Subjective:    Patient ID: Brian FriendlyJames R Whitson, male    DOB: 10/10/1946, 71 y.o.   MRN: [id number]  HPI: Brian Harrington is a 71 y.o. male presenting on 07/23/2017 for Hospital followup (COPD exacerbation)   HPI COPD exacerbation/Hospital follow-up/transitional care Patient is coming today for COPD exacerbation/Hospital follow-up/transitional care visit. He was in the hospital on 07/13/2017 and discharged on 07/15/2017. He was found to have COPD exacerbation and given medications for that. He did see a pulmonologist named Dr. Juanetta GoslingHawkins while he was in the hospital. He was sent home with some prednisone for 3 days. He has not been on it since that time. He says his breathing was doing better but now it is tight again and he is still having a lot of troubles and is having to stay on 2 L nasal cannula all the time instead of as needed before. He still has a lot of wheezing and coughing at nighttime symptoms.  Relevant past medical, surgical, family and social history reviewed and updated as indicated. Interim medical history since our last visit reviewed. Allergies and medications reviewed and updated.  Review of Systems  Constitutional: Positive for fatigue. Negative for chills and fever.  HENT: Positive for congestion, postnasal drip, rhinorrhea, sinus pressure, sneezing and sore throat. Negative for ear discharge, ear pain and voice change.   Eyes: Negative for pain, discharge, redness and visual disturbance.  Respiratory: Positive for cough, shortness of breath and wheezing.   Cardiovascular: Negative for chest pain and leg swelling.  Musculoskeletal: Negative for back pain and gait problem.  Skin: Negative for rash.  All other systems reviewed and are negative.   Per HPI unless specifically indicated above     Objective:    BP 123/78   Pulse 71   Temp (!) 97.2 F  (36.2 C) (Oral)   Ht 6' (1.829 m)   Wt 266 lb (120.7 kg)   BMI 36.08 kg/m   Wt Readings from Last 3 Encounters:  07/23/17 266 lb (120.7 kg)  07/13/17 280 lb (127 kg)  06/15/17 286 lb 6 oz (129.9 kg)    Physical Exam  Constitutional: He is oriented to person, place, and time. He appears well-developed and well-nourished. No distress.  HENT:  Right Ear: Tympanic membrane, external ear and ear canal normal.  Left Ear: Tympanic membrane, external ear and ear canal normal.  Nose: Mucosal edema and rhinorrhea present. No sinus tenderness. No epistaxis. Right sinus exhibits maxillary sinus tenderness. Right sinus exhibits no frontal sinus tenderness. Left sinus exhibits maxillary sinus tenderness. Left sinus exhibits no frontal sinus tenderness.  Mouth/Throat: Uvula is midline and mucous membranes are normal. Posterior oropharyngeal edema and posterior oropharyngeal erythema present. No oropharyngeal exudate or tonsillar abscesses.  Eyes: Pupils are equal, round, and reactive to light. Conjunctivae and EOM are normal. Right eye exhibits no discharge. No scleral icterus.  Neck: Neck supple. No thyromegaly present.  Cardiovascular: Normal rate, regular rhythm, normal heart sounds and intact distal pulses.   No murmur heard. Pulmonary/Chest: Effort normal. No accessory muscle usage. No tachypnea. No respiratory distress. He has wheezes in the right upper field, the right middle field, the left upper field and the left middle field. He has rhonchi in the right upper field, the right middle field, the left  upper field and the left middle field. He has no rales.  Musculoskeletal: Normal range of motion. He exhibits no edema.  Lymphadenopathy:    He has no cervical adenopathy.  Neurological: He is alert and oriented to person, place, and time. Coordination normal.  Skin: Skin is warm and dry. No rash noted. He is not diaphoretic.  Psychiatric: He has a normal mood and affect. His behavior is normal.    Nursing note and vitals reviewed.     Assessment & Plan:   Problem List Items Addressed This Visit      Respiratory   COPD with acute exacerbation (HCC) - Primary   Relevant Medications   predniSONE (DELTASONE) 20 MG tablet       Follow up plan: Return in about 3 weeks (around 08/13/2017), or if symptoms worsen or fail to improve.  Counseling provided for all of the vaccine components No orders of the defined types were placed in this encounter.   Arville CareJoshua Ife Vitelli, MD [facility] 07/23/2017, 3:38 PM

## 2017-07-23 NOTE — Addendum Note (Signed)
Addended by: Quay BurowPOTTER, Courney Garrod K on: 07/23/2017 04:06 PM   Modules accepted: Orders

## 2017-07-23 NOTE — Telephone Encounter (Signed)
Dettinger is taking care of medication

## 2017-07-28 DIAGNOSIS — J441 Chronic obstructive pulmonary disease with (acute) exacerbation: Secondary | ICD-10-CM | POA: Diagnosis not present

## 2017-07-28 DIAGNOSIS — J9611 Chronic respiratory failure with hypoxia: Secondary | ICD-10-CM | POA: Diagnosis not present

## 2017-07-28 DIAGNOSIS — G4733 Obstructive sleep apnea (adult) (pediatric): Secondary | ICD-10-CM | POA: Diagnosis not present

## 2017-07-28 DIAGNOSIS — I10 Essential (primary) hypertension: Secondary | ICD-10-CM | POA: Diagnosis not present

## 2017-08-12 ENCOUNTER — Other Ambulatory Visit: Payer: Self-pay | Admitting: Family Medicine

## 2017-08-17 ENCOUNTER — Ambulatory Visit (INDEPENDENT_AMBULATORY_CARE_PROVIDER_SITE_OTHER): Payer: Medicare HMO | Admitting: Family Medicine

## 2017-08-17 ENCOUNTER — Encounter: Payer: Self-pay | Admitting: Family Medicine

## 2017-08-17 VITALS — BP 132/84 | HR 68 | Temp 97.0°F | Ht 72.0 in | Wt 285.0 lb

## 2017-08-17 DIAGNOSIS — Z23 Encounter for immunization: Secondary | ICD-10-CM

## 2017-08-17 DIAGNOSIS — J441 Chronic obstructive pulmonary disease with (acute) exacerbation: Secondary | ICD-10-CM

## 2017-08-17 NOTE — Progress Notes (Signed)
BP 132/84   Pulse 68   Temp (!) 97 F (36.1 C) (Oral)   Ht 6' (1.829 m)   Wt 285 lb (129.3 kg)   SpO2 95%   BMI 38.65 kg/m    Subjective:    Patient ID: Brian Harrington, male    DOB: Jun 06, 1946, 71 y.o.   MRN: [id number]  HPI: Brian Harrington is a 71 y.o. male presenting on 08/17/2017 for COPD (3 week followup)   HPI COPD and breathing recheck Patient is coming in today for COPD and breathing recheck and says that he is doing much better than what he was. He is still having to use his nebulizer 2 or 3 times a day and his Combivent about once a day. His pulmonologist recommended that he go off the Combivent completely but he has been unable to do that to this point yet. He says he does have a new inhaler that he was given by his pulmonologist. He denies any shortness of breath or wheezing today. He did finish the prednisone a few days ago and has still been doing okay. He still uses oxygen as needed at home and at night. He denies any coughing or wheezing spells since he was on the prednisone.  Relevant past medical, surgical, family and social history reviewed and updated as indicated. Interim medical history since our last visit reviewed. Allergies and medications reviewed and updated.  Review of Systems  Constitutional: Negative for chills and fever.  HENT: Negative for congestion, rhinorrhea, sinus pain, sore throat and voice change.   Eyes: Negative for discharge.  Respiratory: Positive for cough and wheezing. Negative for shortness of breath.   Cardiovascular: Negative for chest pain and leg swelling.  Musculoskeletal: Negative for back pain and gait problem.  Skin: Negative for rash.  All other systems reviewed and are negative.   Per HPI unless specifically indicated above     Objective:    BP 132/84   Pulse 68   Temp (!) 97 F (36.1 C) (Oral)   Ht 6' (1.829 m)   Wt 285 lb (129.3 kg)   SpO2 95%   BMI 38.65 kg/m   Wt Readings from Last 3 Encounters:  08/17/17 285  lb (129.3 kg)  07/23/17 266 lb (120.7 kg)  07/13/17 280 lb (127 kg)    Physical Exam  Constitutional: He is oriented to person, place, and time. He appears well-developed and well-nourished. No distress.  Eyes: Conjunctivae are normal. No scleral icterus.  Neck: Neck supple. No thyromegaly present.  Cardiovascular: Normal rate, regular rhythm, normal heart sounds and intact distal pulses.   No murmur heard. Pulmonary/Chest: Effort normal and breath sounds normal. No respiratory distress. He has no wheezes. He has no rales.  Musculoskeletal: Normal range of motion. He exhibits no edema.  Lymphadenopathy:    He has no cervical adenopathy.  Neurological: He is alert and oriented to person, place, and time. Coordination normal.  Skin: Skin is warm and dry. No rash noted. He is not diaphoretic.  Psychiatric: He has a normal mood and affect. His behavior is normal.  Nursing note and vitals reviewed.      Assessment & Plan:   Problem List Items Addressed This Visit      Respiratory   COPD with acute exacerbation (HCC) - Primary      Continue current medications and continue to see pulmonology.  Follow up plan: Return if symptoms worsen or fail to improve.  Counseling provided for all of the  vaccine components Orders Placed This Encounter  Procedures  . Tdap vaccine greater than or equal to 7yo IM  . Pneumococcal conjugate vaccine 13-valent    Arville Care, MD [facility] 08/17/2017, 11:04 AM

## 2017-08-18 DIAGNOSIS — D869 Sarcoidosis, unspecified: Secondary | ICD-10-CM | POA: Diagnosis not present

## 2017-09-07 ENCOUNTER — Encounter: Payer: Self-pay | Admitting: *Deleted

## 2017-09-07 ENCOUNTER — Ambulatory Visit (INDEPENDENT_AMBULATORY_CARE_PROVIDER_SITE_OTHER): Payer: Medicare HMO | Admitting: *Deleted

## 2017-09-07 VITALS — BP 136/82 | HR 68 | Ht 70.0 in | Wt 282.0 lb

## 2017-09-07 DIAGNOSIS — Z0000 Encounter for general adult medical examination without abnormal findings: Secondary | ICD-10-CM

## 2017-09-07 DIAGNOSIS — Z23 Encounter for immunization: Secondary | ICD-10-CM | POA: Diagnosis not present

## 2017-09-07 DIAGNOSIS — Z1211 Encounter for screening for malignant neoplasm of colon: Secondary | ICD-10-CM

## 2017-09-07 NOTE — Patient Instructions (Addendum)
Mr. Brian Harrington , Thank you for taking time to come for your Medicare Wellness Visit. I appreciate your ongoing commitment to your health goals. Please review the following plan we discussed and let me know if I can assist you in the future.   These are the goals we discussed: Goals    . Exercise 3x per week (30 min per time)       This is a list of the screening recommended for you and due dates:  Health Maintenance  Topic Date Due  . Colon Cancer Screening  11/03/1998  . Flu Shot  07/29/2017  . Tetanus Vaccine  08/18/2027  .  Hepatitis C: One time screening is recommended by Center for Disease Control  (CDC) for  adults born from [birth year] through 1963.   Completed  . Pneumonia vaccines  Completed   Return fecal occult blood test   Recombinant Zoster (Shingles) Vaccine, RZV: What You Need to Know 1. Why get vaccinated? Shingles (also called herpes zoster, or just zoster) is a painful skin rash, often with blisters. Shingles is caused by the varicella zoster virus, the same virus that causes chickenpox. After you have chickenpox, the virus stays in your body and can cause shingles later in life. You can't catch shingles from another person. However, a person who has never had chickenpox (or chickenpox vaccine) could get chickenpox from someone with shingles. A shingles rash usually appears on one side of the face or body and heals within 2 to 4 weeks. Its main symptom is pain, which can be severe. Other symptoms can include fever, headache, chills and upset stomach. Very rarely, a shingles infection can lead to pneumonia, hearing problems, blindness, brain inflammation (encephalitis), or death. For about 1 person in 5, severe pain can continue even long after the rash has cleared up. This long-lasting pain is called post-herpetic neuralgia (PHN). Shingles is far more common in people 69 years of age and older than in younger people, and the risk increases with age. It is also more common in people  whose immune system is weakened because of a disease such as cancer, or by drugs such as steroids or chemotherapy. At least 1 million people a year in the [place] get shingles. 2. Shingles vaccine (recombinant) Recombinant shingles vaccine was approved by FDA in 2017 for the prevention of shingles. In clinical trials, it was more than 90% effective in preventing shingles. It can also reduce the likelihood of PHN. Two doses, 2 to 6 months apart, are recommended for adults 50 and older. This vaccine is also recommended for people who have already gotten the live shingles vaccine (Zostavax). There is no live virus in this vaccine. 3. Some people should not get this vaccine Tell your vaccine provider if you:  Have any severe, life-threatening allergies. A person who has ever had a life-threatening allergic reaction after a dose of recombinant shingles vaccine, or has a severe allergy to any component of this vaccine, may be advised not to be vaccinated. Ask your health care provider if you want information about vaccine components.  Are pregnant or breastfeeding. There is not much information about use of recombinant shingles vaccine in pregnant or nursing women. Your healthcare provider might recommend delaying vaccination.  Are not feeling well. If you have a mild illness, such as a cold, you can probably get the vaccine today. If you are moderately or severely ill, you should probably wait until you recover. Your doctor can advise you.  4. Risks of  a vaccine reaction With any medicine, including vaccines, there is a chance of reactions. After recombinant shingles vaccination, a person might experience:  Pain, redness, soreness, or swelling at the site of the injection  Headache, muscle aches, fever, shivering, fatigue  In clinical trials, most people got a sore arm with mild or moderate pain after vaccination, and some also had redness and swelling where they got the shot. Some people  felt tired, had muscle pain, a headache, shivering, fever, stomach pain, or nausea. About 1 out of 6 people who got recombinant zoster vaccine experienced side effects that prevented them from doing regular activities. Symptoms went away on their own in about 2 to 3 days. Side effects were more common in younger people. You should still get the second dose of recombinant zoster vaccine even if you had one of these reactions after the first dose. Other things that could happen after this vaccine:  People sometimes faint after medical procedures, including vaccination. Sitting or lying down for about 15 minutes can help prevent fainting and injuries caused by a fall. Tell your provider if you feel dizzy or have vision changes or ringing in the ears.  Some people get shoulder pain that can be more severe and longer-lasting than routine soreness that can follow injections. This happens very rarely.  Any medication can cause a severe allergic reaction. Such reactions to a vaccine are estimated at about 1 in a million doses, and would happen within a few minutes to a few hours after the vaccination. As with any medicine, there is a very remote chance of a vaccine causing a serious injury or death. The safety of vaccines is always being monitored. For more information, visit: http://floyd.org/ 5. What if there is a serious problem? What should I look for?  Look for anything that concerns you, such as signs of a severe allergic reaction, very high fever, or unusual behavior. Signs of a severe allergic reaction can include hives, swelling of the face and throat, difficulty breathing, a fast heartbeat, dizziness, and weakness. These would usually start a few minutes to a few hours after the vaccination. What should I do?  If you think it is a severe allergic reaction or other emergency that can't wait, call 9-1-1 and get to the nearest hospital. Otherwise, call your health care provider. Afterward,  the reaction should be reported to the Vaccine Adverse Event Reporting System (VAERS). Your doctor should file this report, or you can do it yourself through the VAERS web site atwww.vaers.https://coleman.net/ by calling (910)322-0260. VAERS does not give medical advice. 6. How can I learn more?  Ask your healthcare provider. He or she can give you the vaccine package insert or suggest other sources of information.  Call your local or state health department.  Contact the Centers for Disease Control and Prevention (CDC): ? Call 906-152-0586 (1-800-CDC-INFO) or ? Visit the CDC's website at PicCapture.uy CDC Vaccine Information Statement (VIS) Recombinant Zoster Vaccine (02/09/2017) This information is not intended to replace advice given to you by your health care provider. Make sure you discuss any questions you have with your health care provider. Document Released: 02/24/2017 Document Revised: 02/24/2017 Document Reviewed: 02/24/2017 Elsevier Interactive Patient Education  [employer].

## 2017-09-08 ENCOUNTER — Other Ambulatory Visit: Payer: Self-pay | Admitting: Family Medicine

## 2017-09-10 NOTE — Progress Notes (Signed)
Subjective:   Brian Harrington is a 71 y.o. male who presents for an Initial Medicare Annual Wellness Visit. Brian Harrington lives at home alone.   Review of Systems  Reports that his health is about the same as last year.   Cardiac Risk Factors include: advanced age (>3555men, 88>65 women);hypertension;male gender;obesity (BMI >30kg/m2);sedentary lifestyle    Objective:    Today's Vitals   09/07/17 1047  BP: 136/82  Pulse: 68  Weight: 282 lb (127.9 kg)  Height: 5\' 10"  (1.778 m)   Body mass index is 40.46 kg/m.  Current Medications (verified) Outpatient Encounter Prescriptions as of 09/07/2017  Medication Sig  . albuterol (PROVENTIL) (2.5 MG/3ML) 0.083% nebulizer solution Take 3 mLs (2.5 mg total) by nebulization every 6 (six) hours as needed for wheezing or shortness of breath.  . budesonide-formoterol (SYMBICORT) 160-4.5 MCG/ACT inhaler Inhale 2 puffs into the lungs 2 (two) times daily.  . carisoprodol (SOMA) 350 MG tablet Take 1 Tablet by mouth 2 times a day as needed (Patient taking differently: Take 1 Tablet by mouth 3 times a day as needed for muscle spasms)  . COMBIVENT RESPIMAT 20-100 MCG/ACT AERS respimat USE TWO puffs by MOUTH ONCE daily as instructed  . loratadine (CLARITIN) 10 MG tablet Take 1 Tablet by mouth once daily  . metoprolol tartrate (LOPRESSOR) 25 MG tablet Take 1 Tablet by mouth 2 times a day  . oxyCODONE-acetaminophen (PERCOCET) 10-325 MG tablet Take 1 tablet by mouth 2 (two) times daily as needed for pain. Do not refill until 30 days from prescription date  . OXYGEN Inhale 2 L into the lungs continuous. 2 liters as needed   . oxymetazoline (AFRIN) 0.05 % nasal spray Place 1 spray into both nostrils 2 (two) times daily as needed for congestion.   . pantoprazole (PROTONIX) 40 MG tablet Take 1 Tablet by mouth once daily (Patient taking differently: take one tablet by mouth daily at bedtime)  . sodium chloride (OCEAN) 0.65 % SOLN nasal spray Place 1 spray into both  nostrils as needed for congestion.  . [DISCONTINUED] tiZANidine (ZANAFLEX) 4 MG tablet Take 1 tablet (4 mg total) by mouth every 6 (six) hours as needed.  . [DISCONTINUED] SYMBICORT 160-4.5 MCG/ACT inhaler USE TWO puffs by MOUTH twice daily   No facility-administered encounter medications on file as of 09/07/2017.     Allergies (verified) Amlodipine besylate; Sulfa antibiotics; and Sulfonamide derivatives   History: Past Medical History:  Diagnosis Date  . Arthritis   . Back pain, chronic   . CAD (coronary artery disease)   . COPD (chronic obstructive pulmonary disease) (HCC)    on 2L home O2  . Hypertension   . Sarcoidosis of lung (HCC)    reported by patient but no clear documentation of this condition  . Sleep apnea    no CPAP   Past Surgical History:  Procedure Laterality Date  . BACK SURGERY    . CARDIAC CATHETERIZATION     bare-metal stent to left anterior descending  . HAND SURGERY    . NECK SURGERY  06/23/2017  . Skin grafting left leg     Family History  Problem Relation Age of Onset  . Arthritis Mother   . Thrombocytopenia Mother   . Diabetes Brother   . Colon polyps Brother   . Depression Father   . CAD Neg Hx    Social History   Occupational History  . Self employed-Farmer    Social History Main Topics  . Smoking status:  Former Smoker    Packs/day: 1.00    Years: 25.00    Types: Cigarettes    Quit date: 09/23/1991  . Smokeless tobacco: Never Used  . Alcohol use 0.5 oz/week    1 Standard drinks or equivalent per week  . Drug use: Yes    Types: Marijuana     Comment: Hasn't used recently. Only used for pain management.   . Sexual activity: Yes   Tobacco Counseling No tobacco use  Activities of Daily Living In your present state of health, do you have any difficulty performing the following activities: 09/07/2017 07/13/2017  Hearing? 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 N N  Difficulty concentrating or making decisions? N N    Walking or climbing stairs? N N  Dressing or bathing? N N  Doing errands, shopping? N N  Preparing Food and eating ? N -  Using the Toilet? N -  In the past six months, have you accidently leaked urine? N -  Do you have problems with loss of bowel control? N -  Managing your Medications? N -  Managing your Finances? N -  Housekeeping or managing your Housekeeping? N -  Some recent data might be hidden    Immunizations and Health Maintenance Immunization History  Administered Date(s) Administered  . Influenza,inj,Quad PF,6+ Mos 09/16/2016  . Pneumococcal Conjugate-13 08/17/2017  . Pneumococcal Polysaccharide-23 12/23/2013  . Tdap 08/17/2017   Health Maintenance Due  Topic Date Due  . COLONOSCOPY  11/03/1998  . INFLUENZA VACCINE  07/29/2017    Patient Care Team: Dettinger, Elige Radon, MD as PCP - General (Family Medicine) stone, nancy as Consulting Physician (Family Medicine)  No hospitalizations, ER visits, or surgeries this past year.     Assessment:   This is a routine wellness examination for Brian Harrington.   Hearing/Vision screen No deficits noted during visit.   Dietary issues and exercise activities discussed: Current Exercise Habits: The patient does not participate in regular exercise at present (Stays active around his house. Takes care of cows and Goats. ), Exercise limited by: respiratory conditions(s) (COPD, sarcoidosis)  Diet: Eats 3 meals a day  Goals    . Exercise 3x per week (30 min per time)      Depression Screen PHQ 2/9 Scores 08/17/2017 07/23/2017 06/15/2017 03/18/2017  PHQ - 2 Score 0 0 0 0    Fall Risk Fall Risk  08/17/2017 07/23/2017 06/15/2017 03/18/2017 12/17/2016  Falls in the past year? No No No Yes No  Number falls in past yr: - - - 2 or more -  Injury with Fall? - - - No -  Comment - - - - -    Cognitive Function: MMSE - Mini Mental State Exam 09/07/2017 09/07/2017  Orientation to time 5 5  Orientation to Place 5 5  Registration 3 -   Attention/ Calculation 5 -  Recall 3 -  Language- name 2 objects 2 -  Language- repeat 1 -  Language- follow 3 step command 3 -  Language- read & follow direction 1 -  Write a sentence 1 -  Copy design 1 -  Total score 30 -    normal exam    Screening Tests Health Maintenance  Topic Date Due  . COLONOSCOPY  11/03/1998  . INFLUENZA VACCINE  07/29/2017  . TETANUS/TDAP  08/18/2027  . Hepatitis C Screening  Completed  . PNA vac Low Risk Adult  Completed        Plan:  FOBT given today Return Advance Directives Continue to stay as active as possible. Aim for 150 minutes of moderate activity weekly. Shingles vaccine given today. Next due in 6 months. Flu shot given today.   I have personally reviewed and noted the following in the patient's chart:   . Medical and social history . Use of alcohol, tobacco or illicit drugs  . Current medications and supplements . Functional ability and status . Nutritional status . Physical activity . Advanced directives . List of other physicians . Hospitalizations, surgeries, and ER visits in previous 12 months . Vitals . Screenings to include cognitive, depression, and falls . Referrals and appointments  In addition, I have reviewed and discussed with patient certain preventive protocols, quality metrics, and best practice recommendations. A written personalized care plan for preventive services as well as general preventive health recommendations were provided to patient.     Demetrios Loll, RN  09/10/2017

## 2017-09-11 ENCOUNTER — Encounter: Payer: Self-pay | Admitting: Family Medicine

## 2017-09-11 ENCOUNTER — Ambulatory Visit (INDEPENDENT_AMBULATORY_CARE_PROVIDER_SITE_OTHER): Payer: Medicare HMO | Admitting: Family Medicine

## 2017-09-11 VITALS — BP 139/81 | HR 74 | Temp 97.1°F | Ht 70.0 in | Wt 286.0 lb

## 2017-09-11 DIAGNOSIS — M545 Low back pain: Secondary | ICD-10-CM | POA: Diagnosis not present

## 2017-09-11 DIAGNOSIS — M150 Primary generalized (osteo)arthritis: Secondary | ICD-10-CM

## 2017-09-11 DIAGNOSIS — F1120 Opioid dependence, uncomplicated: Secondary | ICD-10-CM

## 2017-09-11 DIAGNOSIS — G8929 Other chronic pain: Secondary | ICD-10-CM | POA: Diagnosis not present

## 2017-09-11 DIAGNOSIS — M159 Polyosteoarthritis, unspecified: Secondary | ICD-10-CM

## 2017-09-11 MED ORDER — OXYCODONE-ACETAMINOPHEN 10-325 MG PO TABS: 1.0000 | ORAL_TABLET | Freq: Two times a day (BID) | ORAL | 0 refills | Status: DC | PRN

## 2017-09-11 MED ORDER — OXYCODONE-ACETAMINOPHEN 10-325 MG PO TABS: 1.0000 | ORAL_TABLET | Freq: Three times a day (TID) | ORAL | 0 refills | Status: DC | PRN

## 2017-09-11 MED ORDER — CARISOPRODOL 350 MG PO TABS: ORAL_TABLET | ORAL | 2 refills | Status: DC

## 2017-09-11 NOTE — Progress Notes (Signed)
BP 139/81   Pulse 74   Temp (!) 97.1 F (36.2 C) (Oral)   Ht  (1.778 m)   Wt 286 lb (129.7 kg)   BMI 41.04 kg/m    Subjective:    Patient ID: Brian Harrington, male    DOB: 11-May-1946, 71 y.o.   MRN: [id number]  HPI: Brian Harrington is a 71 y.o. male presenting on 09/11/2017 for Medication refills (Oxycodone)   HPI Pain from osteoarthritis and sarcoid Patient is coming in for recheck on his chronic pain and refill medications. He says he ran out of the [place] and has been having more difficulties because of that and feels like he might need a little bit more but is willing to try one more cycle facility back on. We instructed him about good Rx because he said the Tresa Garter was not being covered by his insurance anymore and we try Zanaflex but he said is just not the same. He says most of his pain is now in lower back at this point but it does radiate through all of his joints because of his sarcoid issues.  Relevant past medical, surgical, family and social history reviewed and updated as indicated. Interim medical history since our last visit reviewed. Allergies and medications reviewed and updated.  Review of Systems  Constitutional: Negative for chills and fever.  Respiratory: Negative for shortness of breath and wheezing.   Cardiovascular: Negative for chest pain and leg swelling.  Musculoskeletal: Positive for arthralgias, back pain and neck pain. Negative for gait problem.  Skin: Negative for rash.  All other systems reviewed and are negative.   Per HPI unless specifically indicated above   Allergies as of 09/11/2017      Reactions   Amlodipine Besylate    REACTION: tongue and facial swelling, difficulty breathing   Sulfa Antibiotics Nausea And Vomiting   Sulfonamide Derivatives       Medication List       Accurate as of 09/11/17  1:59 PM. Always use your most recent med list.          albuterol (2.5 MG/3ML) 0.083% nebulizer solution Commonly known as:   PROVENTIL Take 3 mLs (2.5 mg total) by nebulization every 6 (six) hours as needed for wheezing or shortness of breath.   budesonide-formoterol 160-4.5 MCG/ACT inhaler Commonly known as:  SYMBICORT Inhale 2 puffs into the lungs 2 (two) times daily.   carisoprodol 350 MG tablet Commonly known as:  SOMA Take 1 Tablet by mouth 2-3 times a day as needed   COMBIVENT RESPIMAT 20-100 MCG/ACT Aers respimat Generic drug:  Ipratropium-Albuterol USE TWO puffs by MOUTH ONCE daily as instructed   loratadine 10 MG tablet Commonly known as:  CLARITIN Take 1 Tablet by mouth once daily   metoprolol tartrate 25 MG tablet Commonly known as:  LOPRESSOR Take 1 Tablet by mouth 2 times a day   oxyCODONE-acetaminophen 10-325 MG tablet Commonly known as:  PERCOCET Take 1 tablet by mouth 2 (two) times daily as needed for pain. Do not refill until 30 days from prescription date   oxyCODONE-acetaminophen 10-325 MG tablet Commonly known as:  PERCOCET Take 1 tablet by mouth every 8 (eight) hours as needed for pain. Do not refill until 60 days from prescription date   oxyCODONE-acetaminophen 10-325 MG tablet Commonly known as:  PERCOCET Take 1 tablet by mouth every 8 (eight) hours as needed for pain.   OXYGEN Inhale 2 L into the lungs continuous. 2 liters as needed  oxymetazoline 0.05 % nasal spray Commonly known as:  AFRIN Place 1 spray into both nostrils 2 (two) times daily as needed for congestion.   pantoprazole 40 MG tablet Commonly known as:  PROTONIX Take 1 Tablet by mouth once daily   sodium chloride 0.65 % Soln nasal spray Commonly known as:  OCEAN Place 1 spray into both nostrils as needed for congestion.   tiZANidine 4 MG tablet Commonly known as:  ZANAFLEX Take 1 Tablet by mouth every 6 hours as needed            Discharge Care Instructions        Start     Ordered   09/11/17 0000  oxyCODONE-acetaminophen (PERCOCET) 10-325 MG tablet  2 times daily PRN     09/11/17 1359    09/11/17 0000  oxyCODONE-acetaminophen (PERCOCET) 10-325 MG tablet  Every 8 hours PRN     09/11/17 1359   09/11/17 0000  oxyCODONE-acetaminophen (PERCOCET) 10-325 MG tablet  Every 8 hours PRN     09/11/17 1359   09/11/17 0000  carisoprodol (SOMA) 350 MG tablet    Comments:  This prescription was filled on 05/12/2017. Any refills authorized will be placed on file.   09/11/17 1359         Objective:    BP 139/81   Pulse 74   Temp (!) 97.1 F (36.2 C) (Oral)   Ht  (1.778 m)   Wt 286 lb (129.7 kg)   BMI 41.04 kg/m   Wt Readings from Last 3 Encounters:  09/11/17 286 lb (129.7 kg)  09/07/17 282 lb (127.9 kg)  08/17/17 285 lb (129.3 kg)    Physical Exam  Constitutional: He is oriented to person, place, and time. He appears well-developed and well-nourished. No distress.  Eyes: Conjunctivae are normal. No scleral icterus.  Cardiovascular: Normal rate, regular rhythm, normal heart sounds and intact distal pulses.   No murmur heard. Pulmonary/Chest: Effort normal and breath sounds normal. No respiratory distress. He has no wheezes. He has no rales.  Musculoskeletal: Normal range of motion. He exhibits tenderness (Neck pain and mid back pain and low back pain, no radiation, negative straight leg raise). He exhibits no edema.  Neurological: He is alert and oriented to person, place, and time. Coordination normal.  Skin: Skin is warm and dry. No rash noted. He is not diaphoretic.  Psychiatric: He has a normal mood and affect. His behavior is normal.  Nursing note and vitals reviewed.     Assessment & Plan:   Problem List Items Addressed This Visit      Musculoskeletal and Integument   Osteoarthritis   Relevant Medications   oxyCODONE-acetaminophen (PERCOCET) 10-325 MG tablet   oxyCODONE-acetaminophen (PERCOCET) 10-325 MG tablet   oxyCODONE-acetaminophen (PERCOCET) 10-325 MG tablet   carisoprodol (SOMA) 350 MG tablet    Other Visit Diagnoses    Uncomplicated opioid  dependence (HCC)    -  Primary   Relevant Medications   oxyCODONE-acetaminophen (PERCOCET) 10-325 MG tablet   oxyCODONE-acetaminophen (PERCOCET) 10-325 MG tablet   oxyCODONE-acetaminophen (PERCOCET) 10-325 MG tablet   carisoprodol (SOMA) 350 MG tablet   Chronic bilateral low back pain without sciatica       Relevant Medications   oxyCODONE-acetaminophen (PERCOCET) 10-325 MG tablet   oxyCODONE-acetaminophen (PERCOCET) 10-325 MG tablet   oxyCODONE-acetaminophen (PERCOCET) 10-325 MG tablet   carisoprodol (SOMA) 350 MG tablet       Follow up plan: Return in about 3 months (around 12/11/2017), or if symptoms worsen  or fail to improve, for Recheck labs and COPD and pain.  Counseling provided for all of the vaccine components No orders of the defined types were placed in this encounter.   Arville Care, MD [facility] 09/11/2017, 1:59 PM

## 2017-09-15 ENCOUNTER — Ambulatory Visit: Payer: Medicare HMO | Admitting: Family Medicine

## 2017-09-18 ENCOUNTER — Ambulatory Visit (INDEPENDENT_AMBULATORY_CARE_PROVIDER_SITE_OTHER): Payer: Medicare HMO | Admitting: Nurse Practitioner

## 2017-09-18 ENCOUNTER — Encounter: Payer: Self-pay | Admitting: Nurse Practitioner

## 2017-09-18 VITALS — BP 134/84 | HR 85 | Temp 97.9°F | Ht 70.0 in | Wt 282.0 lb

## 2017-09-18 DIAGNOSIS — Z202 Contact with and (suspected) exposure to infections with a predominantly sexual mode of transmission: Secondary | ICD-10-CM | POA: Diagnosis not present

## 2017-09-18 DIAGNOSIS — D869 Sarcoidosis, unspecified: Secondary | ICD-10-CM | POA: Diagnosis not present

## 2017-09-18 NOTE — Progress Notes (Signed)
   Subjective:    Patient ID: Brian Harrington, male    DOB: 05/13/1946, 71 y.o.   MRN: [id number]  HPI Patient got a phone called today stating that he needs to be checked for STD. Has no idea what the other person he was with had. He denies any penile problems or symptoms. He has had same sexual parner for about 1 year.    Review of Systems  Constitutional: Negative.   HENT: Negative.   Respiratory: Negative.   Cardiovascular: Negative.   Genitourinary: Negative.   Musculoskeletal: Negative.   Neurological: Negative.   Psychiatric/Behavioral: Negative.   All other systems reviewed and are negative.      Objective:   Physical Exam  Constitutional: He is oriented to person, place, and time. He appears well-developed and well-nourished. No distress.  Cardiovascular: Normal rate and regular rhythm.   Pulmonary/Chest: Effort normal and breath sounds normal.  Neurological: He is alert and oriented to person, place, and time.  Skin: Skin is warm and dry.  Psychiatric: He has a normal mood and affect. His behavior is normal. Judgment and thought content normal.    BP 134/84   Pulse 85   Temp 97.9 F (36.6 C) (Oral)   Ht  (1.778 m)   Wt 282 lb (127.9 kg)   BMI 40.46 kg/m        Assessment & Plan:   1. Possible exposure to STD    Orders Placed This Encounter  Procedures  . GC/Chlamydia Probe Amp  . STD Screen (8)  . HIV antibody (with reflex)   Practice safe sex Will let him know about test results  Mary-Margaret Daphine Deutscher, FNP

## 2017-09-18 NOTE — Patient Instructions (Signed)
Sexually Transmitted Disease  A sexually transmitted disease (STD) is a disease or infection that may be passed (transmitted) from person to person, usually during sexual activity. This may happen by way of saliva, semen, blood, vaginal mucus, or urine. Common STDs include:   Gonorrhea.   Chlamydia.   Syphilis.   HIV and AIDS.   Genital herpes.   Hepatitis B and C.   Trichomonas.   Human papillomavirus (HPV).   Pubic lice.   Scabies.   Mites.   Bacterial vaginosis.    What are the causes?  An STD may be caused by bacteria, a virus, or parasites. STDs are often transmitted during sexual activity if one person is infected. However, they may also be transmitted through nonsexual means. STDs may be transmitted after:   Sexual intercourse with an infected person.   Sharing sex toys with an infected person.   Sharing needles with an infected person or using unclean piercing or tattoo needles.   Having intimate contact with the genitals, mouth, or rectal areas of an infected person.   Exposure to infected fluids during birth.    What are the signs or symptoms?  Different STDs have different symptoms. Some people may not have any symptoms. If symptoms are present, they may include:   Painful or bloody urination.   Pain in the pelvis, abdomen, vagina, anus, throat, or eyes.   A skin rash, itching, or irritation.   Growths, ulcerations, blisters, or sores in the genital and anal areas.   Abnormal vaginal discharge with or without bad odor.   Penile discharge in men.   Fever.   Pain or bleeding during sexual intercourse.   Swollen glands in the groin area.   Yellow skin and eyes (jaundice). This is seen with hepatitis.   Swollen testicles.   Infertility.   Sores and blisters in the mouth.    How is this diagnosed?  To make a diagnosis, your health care provider may:   Take a medical history.   Perform a physical exam.   Take a sample of any discharge to examine.   Swab the throat, cervix,  opening to the penis, rectum, or vagina for testing.   Test a sample of your first morning urine.   Perform blood tests.   Perform a Pap test, if this applies.   Perform a colposcopy.   Perform a laparoscopy.    How is this treated?  Treatment depends on the STD. Some STDs may be treated but not cured.   Chlamydia, gonorrhea, trichomonas, and syphilis can be cured with antibiotic medicine.   Genital herpes, hepatitis, and HIV can be treated, but not cured, with prescribed medicines. The medicines lessen symptoms.   Genital warts from HPV can be treated with medicine or by freezing, burning (electrocautery), or surgery. Warts may come back.   HPV cannot be cured with medicine or surgery. However, abnormal areas may be removed from the cervix, vagina, or vulva.   If your diagnosis is confirmed, your recent sexual partners need treatment. This is true even if they are symptom-free or have a negative culture or evaluation. They should not have sex until their health care providers say it is okay.   Your health care provider may test you for infection again 3 months after treatment.    How is this prevented?  Take these steps to reduce your risk of getting an STD:   Use latex condoms, dental dams, and water-soluble lubricants during sexual activity. Do not use   petroleum jelly or oils.   Avoid having multiple sex partners.   Do not have sex with someone who has other sex partners.   Do not have sex with anyone you do not know or who is at high risk for an STD.   Avoid risky sex practices that can break your skin.   Do not have sex if you have open sores on your mouth or skin.   Avoid drinking too much alcohol or taking illegal drugs. Alcohol and drugs can affect your judgment and put you in a vulnerable position.   Avoid engaging in oral and anal sex acts.   Get vaccinated for HPV and hepatitis. If you have not received these vaccines in the past, talk to your health care provider about whether one or  both might be right for you.   If you are at risk of being infected with HIV, it is recommended that you take a prescription medicine daily to prevent HIV infection. This is called pre-exposure prophylaxis (PrEP). You are considered at risk if:  ? You are a man who has sex with other men (MSM).  ? You are a heterosexual man or woman and are sexually active with more than one partner.  ? You take drugs by injection.  ? You are sexually active with a partner who has HIV.   Talk with your health care provider about whether you are at high risk of being infected with HIV. If you choose to begin PrEP, you should first be tested for HIV. You should then be tested every 3 months for as long as you are taking PrEP.    Contact a health care provider if:   See your health care provider.   Tell your sexual partner(s). They should be tested and treated for any STDs.   Do not have sex until your health care provider says it is okay.  Get help right away if:  Contact your health care provider right away if:   You have severe abdominal pain.   You are a man and notice swelling or pain in your testicles.   You are a woman and notice swelling or pain in your vagina.    This information is not intended to replace advice given to you by your health care provider. Make sure you discuss any questions you have with your health care provider.  Document Released: 03/07/2003 Document Revised: 07/04/2016 Document Reviewed: 07/05/2013  Elsevier Interactive Patient Education  2018 [employer]

## 2017-09-19 LAB — SPECIMEN STATUS REPORT

## 2017-09-21 ENCOUNTER — Other Ambulatory Visit: Payer: Self-pay | Admitting: Family Medicine

## 2017-09-21 LAB — STD SCREEN (8)
HIV Screen 4th Generation wRfx: NONREACTIVE
HSV 1 GLYCOPROTEIN G AB, IGG: 55.1 {index} — AB (ref 0.00–0.90)
HSV 2 IgG, Type Spec: 1.17 index — ABNORMAL HIGH (ref 0.00–0.90)
Hep A IgM: NEGATIVE
Hep B C IgM: NEGATIVE
Hepatitis B Surface Ag: NEGATIVE
RPR: NONREACTIVE

## 2017-09-21 LAB — HSV-2 IGG SUPPLEMENTAL TEST: HSV-2 IgG Supplemental Test: POSITIVE — AB

## 2017-09-22 ENCOUNTER — Telehealth: Payer: Self-pay

## 2017-09-22 LAB — GC/CHLAMYDIA PROBE AMP
CHLAMYDIA, DNA PROBE: NEGATIVE
NEISSERIA GONORRHOEAE BY PCR: NEGATIVE

## 2017-09-22 NOTE — Telephone Encounter (Signed)
Please let patient know that we tried to send the paperwork to his insurance company and they still denied it

## 2017-09-22 NOTE — Telephone Encounter (Signed)
Insurance denied  Prior auth for Carisoprodol

## 2017-09-23 NOTE — Telephone Encounter (Signed)
Left detailed message for pt and notified pharmacy of insurance denial

## 2017-10-08 ENCOUNTER — Other Ambulatory Visit: Payer: Self-pay | Admitting: Family Medicine

## 2017-10-08 DIAGNOSIS — J439 Emphysema, unspecified: Secondary | ICD-10-CM

## 2017-10-09 ENCOUNTER — Other Ambulatory Visit: Payer: Self-pay | Admitting: *Deleted

## 2017-10-16 ENCOUNTER — Other Ambulatory Visit: Payer: Self-pay | Admitting: Family Medicine

## 2017-10-16 DIAGNOSIS — M150 Primary generalized (osteo)arthritis: Secondary | ICD-10-CM

## 2017-10-16 DIAGNOSIS — F1120 Opioid dependence, uncomplicated: Secondary | ICD-10-CM

## 2017-10-16 DIAGNOSIS — M159 Polyosteoarthritis, unspecified: Secondary | ICD-10-CM

## 2017-10-16 DIAGNOSIS — M545 Low back pain: Principal | ICD-10-CM

## 2017-10-16 DIAGNOSIS — G8929 Other chronic pain: Secondary | ICD-10-CM

## 2017-10-18 DIAGNOSIS — D869 Sarcoidosis, unspecified: Secondary | ICD-10-CM | POA: Diagnosis not present

## 2017-11-02 ENCOUNTER — Other Ambulatory Visit: Payer: Self-pay | Admitting: Family Medicine

## 2017-11-02 DIAGNOSIS — J439 Emphysema, unspecified: Secondary | ICD-10-CM

## 2017-11-03 ENCOUNTER — Other Ambulatory Visit: Payer: Self-pay | Admitting: Family Medicine

## 2017-11-03 DIAGNOSIS — J439 Emphysema, unspecified: Secondary | ICD-10-CM

## 2017-11-09 ENCOUNTER — Encounter: Payer: Self-pay | Admitting: *Deleted

## 2017-11-10 DIAGNOSIS — J019 Acute sinusitis, unspecified: Secondary | ICD-10-CM | POA: Diagnosis not present

## 2017-11-10 DIAGNOSIS — J441 Chronic obstructive pulmonary disease with (acute) exacerbation: Secondary | ICD-10-CM | POA: Diagnosis not present

## 2017-11-10 DIAGNOSIS — J9611 Chronic respiratory failure with hypoxia: Secondary | ICD-10-CM | POA: Diagnosis not present

## 2017-11-10 DIAGNOSIS — I10 Essential (primary) hypertension: Secondary | ICD-10-CM | POA: Diagnosis not present

## 2017-11-13 DIAGNOSIS — Z9889 Other specified postprocedural states: Secondary | ICD-10-CM | POA: Diagnosis not present

## 2017-11-13 DIAGNOSIS — M4322 Fusion of spine, cervical region: Secondary | ICD-10-CM | POA: Diagnosis not present

## 2017-11-13 DIAGNOSIS — M48062 Spinal stenosis, lumbar region with neurogenic claudication: Secondary | ICD-10-CM | POA: Diagnosis not present

## 2017-11-13 DIAGNOSIS — M62838 Other muscle spasm: Secondary | ICD-10-CM | POA: Diagnosis not present

## 2017-11-13 DIAGNOSIS — I10 Essential (primary) hypertension: Secondary | ICD-10-CM | POA: Diagnosis not present

## 2017-11-17 ENCOUNTER — Other Ambulatory Visit: Payer: Self-pay | Admitting: Family Medicine

## 2017-11-18 DIAGNOSIS — I252 Old myocardial infarction: Secondary | ICD-10-CM | POA: Diagnosis not present

## 2017-11-18 DIAGNOSIS — J449 Chronic obstructive pulmonary disease, unspecified: Secondary | ICD-10-CM | POA: Diagnosis not present

## 2017-11-18 DIAGNOSIS — E781 Pure hyperglyceridemia: Secondary | ICD-10-CM | POA: Diagnosis not present

## 2017-11-18 DIAGNOSIS — Z951 Presence of aortocoronary bypass graft: Secondary | ICD-10-CM | POA: Diagnosis not present

## 2017-11-18 DIAGNOSIS — E785 Hyperlipidemia, unspecified: Secondary | ICD-10-CM | POA: Diagnosis not present

## 2017-11-18 DIAGNOSIS — M48062 Spinal stenosis, lumbar region with neurogenic claudication: Secondary | ICD-10-CM | POA: Diagnosis not present

## 2017-11-18 DIAGNOSIS — G4733 Obstructive sleep apnea (adult) (pediatric): Secondary | ICD-10-CM | POA: Diagnosis not present

## 2017-11-18 DIAGNOSIS — D869 Sarcoidosis, unspecified: Secondary | ICD-10-CM | POA: Diagnosis not present

## 2017-11-18 DIAGNOSIS — I10 Essential (primary) hypertension: Secondary | ICD-10-CM | POA: Diagnosis not present

## 2017-11-18 DIAGNOSIS — Z885 Allergy status to narcotic agent status: Secondary | ICD-10-CM | POA: Diagnosis not present

## 2017-11-18 DIAGNOSIS — M48061 Spinal stenosis, lumbar region without neurogenic claudication: Secondary | ICD-10-CM | POA: Diagnosis not present

## 2017-11-18 DIAGNOSIS — Z882 Allergy status to sulfonamides status: Secondary | ICD-10-CM | POA: Diagnosis not present

## 2017-11-20 ENCOUNTER — Inpatient Hospital Stay (HOSPITAL_COMMUNITY)
Admission: EM | Admit: 2017-11-20 | Discharge: 2017-11-22 | DRG: 948 | Disposition: A | Discharge status: Home / Self Care | Payer: Medicare HMO | Attending: Family Medicine | Admitting: Family Medicine

## 2017-11-20 ENCOUNTER — Emergency Department (HOSPITAL_COMMUNITY): Payer: Medicare HMO

## 2017-11-20 ENCOUNTER — Encounter (HOSPITAL_COMMUNITY): Payer: Self-pay | Admitting: Emergency Medicine

## 2017-11-20 ENCOUNTER — Other Ambulatory Visit: Payer: Self-pay

## 2017-11-20 DIAGNOSIS — M51369 Other intervertebral disc degeneration, lumbar region without mention of lumbar back pain or lower extremity pain: Secondary | ICD-10-CM

## 2017-11-20 DIAGNOSIS — Z87891 Personal history of nicotine dependence: Secondary | ICD-10-CM

## 2017-11-20 DIAGNOSIS — J449 Chronic obstructive pulmonary disease, unspecified: Secondary | ICD-10-CM | POA: Diagnosis present

## 2017-11-20 DIAGNOSIS — E871 Hypo-osmolality and hyponatremia: Secondary | ICD-10-CM | POA: Diagnosis not present

## 2017-11-20 DIAGNOSIS — D72829 Elevated white blood cell count, unspecified: Secondary | ICD-10-CM

## 2017-11-20 DIAGNOSIS — Z79899 Other long term (current) drug therapy: Secondary | ICD-10-CM

## 2017-11-20 DIAGNOSIS — M545 Low back pain: Secondary | ICD-10-CM

## 2017-11-20 DIAGNOSIS — R51 Headache: Secondary | ICD-10-CM | POA: Diagnosis not present

## 2017-11-20 DIAGNOSIS — D860 Sarcoidosis of lung: Secondary | ICD-10-CM | POA: Diagnosis present

## 2017-11-20 DIAGNOSIS — Z9981 Dependence on supplemental oxygen: Secondary | ICD-10-CM

## 2017-11-20 DIAGNOSIS — R05 Cough: Secondary | ICD-10-CM | POA: Diagnosis not present

## 2017-11-20 DIAGNOSIS — Z888 Allergy status to other drugs, medicaments and biological substances status: Secondary | ICD-10-CM

## 2017-11-20 DIAGNOSIS — E860 Dehydration: Secondary | ICD-10-CM | POA: Diagnosis not present

## 2017-11-20 DIAGNOSIS — G8929 Other chronic pain: Secondary | ICD-10-CM | POA: Diagnosis not present

## 2017-11-20 DIAGNOSIS — I10 Essential (primary) hypertension: Secondary | ICD-10-CM | POA: Diagnosis not present

## 2017-11-20 DIAGNOSIS — Z6836 Body mass index (BMI) 36.0-36.9, adult: Secondary | ICD-10-CM | POA: Diagnosis not present

## 2017-11-20 DIAGNOSIS — J9611 Chronic respiratory failure with hypoxia: Secondary | ICD-10-CM | POA: Diagnosis not present

## 2017-11-20 DIAGNOSIS — M549 Dorsalgia, unspecified: Secondary | ICD-10-CM | POA: Diagnosis not present

## 2017-11-20 DIAGNOSIS — Z882 Allergy status to sulfonamides status: Secondary | ICD-10-CM

## 2017-11-20 DIAGNOSIS — G8918 Other acute postprocedural pain: Principal | ICD-10-CM | POA: Diagnosis present

## 2017-11-20 DIAGNOSIS — E669 Obesity, unspecified: Secondary | ICD-10-CM | POA: Diagnosis present

## 2017-11-20 DIAGNOSIS — I2510 Atherosclerotic heart disease of native coronary artery without angina pectoris: Secondary | ICD-10-CM | POA: Diagnosis present

## 2017-11-20 DIAGNOSIS — R52 Pain, unspecified: Secondary | ICD-10-CM

## 2017-11-20 DIAGNOSIS — K5900 Constipation, unspecified: Secondary | ICD-10-CM | POA: Diagnosis not present

## 2017-11-20 DIAGNOSIS — Z955 Presence of coronary angioplasty implant and graft: Secondary | ICD-10-CM | POA: Diagnosis not present

## 2017-11-20 DIAGNOSIS — R112 Nausea with vomiting, unspecified: Secondary | ICD-10-CM | POA: Diagnosis present

## 2017-11-20 DIAGNOSIS — M5136 Other intervertebral disc degeneration, lumbar region: Secondary | ICD-10-CM

## 2017-11-20 DIAGNOSIS — G4733 Obstructive sleep apnea (adult) (pediatric): Secondary | ICD-10-CM | POA: Diagnosis present

## 2017-11-20 LAB — COMPREHENSIVE METABOLIC PANEL
ALBUMIN: 3.8 g/dL (ref 3.5–5.0)
ALK PHOS: 67 U/L (ref 38–126)
ALT: 20 U/L (ref 17–63)
ANION GAP: 8 (ref 5–15)
AST: 30 U/L (ref 15–41)
BUN: 22 mg/dL — ABNORMAL HIGH (ref 6–20)
CALCIUM: 8.7 mg/dL — AB (ref 8.9–10.3)
CO2: 27 mmol/L (ref 22–32)
Chloride: 95 mmol/L — ABNORMAL LOW (ref 101–111)
Creatinine, Ser: 0.82 mg/dL (ref 0.61–1.24)
GFR calc Af Amer: >60 mL/min (ref >60)
GFR calc non Af Amer: >60 mL/min (ref >60)
GLUCOSE: 131 mg/dL — AB (ref 65–99)
POTASSIUM: 3.6 mmol/L (ref 3.5–5.1)
SODIUM: 130 mmol/L — AB (ref 135–145)
Total Bilirubin: 1.5 mg/dL — ABNORMAL HIGH (ref 0.3–1.2)
Total Protein: 7.2 g/dL (ref 6.5–8.1)

## 2017-11-20 LAB — BLOOD GAS, ARTERIAL
Acid-Base Excess: 3.5 mmol/L — ABNORMAL HIGH (ref 0.0–2.0)
Bicarbonate: 28.5 mmol/L — ABNORMAL HIGH (ref 20.0–28.0)
DRAWN BY: 257701
O2 Content: 2 L/min
O2 Saturation: 93.4 %
PH ART: 7.403 (ref 7.350–7.450)
Patient temperature: 98.6
pCO2 arterial: 46.6 mmHg (ref 32.0–48.0)
pO2, Arterial: 71.1 mmHg — ABNORMAL LOW (ref 83.0–108.0)

## 2017-11-20 LAB — CBC WITH DIFFERENTIAL/PLATELET
BASOS ABS: 0 10*3/uL (ref 0.0–0.1)
BASOS PCT: 0 %
EOS ABS: 0 10*3/uL (ref 0.0–0.7)
Eosinophils Relative: 0 %
HCT: 38.7 % — ABNORMAL LOW (ref 39.0–52.0)
HEMOGLOBIN: 12.9 g/dL — AB (ref 13.0–17.0)
Lymphocytes Relative: 6 %
Lymphs Abs: 1.1 10*3/uL (ref 0.7–4.0)
MCH: 27.6 pg (ref 26.0–34.0)
MCHC: 33.3 g/dL (ref 30.0–36.0)
MCV: 82.7 fL (ref 78.0–100.0)
Monocytes Absolute: 1.6 10*3/uL — ABNORMAL HIGH (ref 0.1–1.0)
Monocytes Relative: 9 %
NEUTROS PCT: 85 %
Neutro Abs: 16 10*3/uL — ABNORMAL HIGH (ref 1.7–7.7)
Platelets: 188 10*3/uL (ref 150–400)
RBC: 4.68 MIL/uL (ref 4.22–5.81)
RDW: 14.1 % (ref 11.5–15.5)
WBC: 18.8 10*3/uL — AB (ref 4.0–10.5)

## 2017-11-20 LAB — URINALYSIS, ROUTINE W REFLEX MICROSCOPIC
BILIRUBIN URINE: NEGATIVE
Glucose, UA: NEGATIVE mg/dL
Hgb urine dipstick: NEGATIVE
Ketones, ur: 20 mg/dL — AB
Leukocytes, UA: NEGATIVE
NITRITE: NEGATIVE
Protein, ur: NEGATIVE mg/dL
SPECIFIC GRAVITY, URINE: 1.019 (ref 1.005–1.030)
pH: 6 (ref 5.0–8.0)

## 2017-11-20 MED ORDER — VITAMIN B-1 100 MG PO TABS
100.0000 mg | ORAL_TABLET | Freq: Every day | ORAL | Status: DC
  Administered 2017-11-21 – 2017-11-22 (×2): 100 mg via ORAL
  Filled 2017-11-20 (×2): qty 1

## 2017-11-20 MED ORDER — ONDANSETRON HCL 4 MG/2ML IJ SOLN
4.0000 mg | Freq: Four times a day (QID) | INTRAMUSCULAR | Status: DC | PRN
  Administered 2017-11-20 – 2017-11-21 (×2): 4 mg via INTRAVENOUS
  Filled 2017-11-20: qty 2

## 2017-11-20 MED ORDER — POLYETHYLENE GLYCOL 3350 17 G PO PACK
17.0000 g | PACK | Freq: Two times a day (BID) | ORAL | Status: DC
  Administered 2017-11-21 – 2017-11-22 (×3): 17 g via ORAL
  Filled 2017-11-20 (×4): qty 1

## 2017-11-20 MED ORDER — ONDANSETRON HCL 4 MG PO TABS: 4.0000 mg | ORAL_TABLET | Freq: Four times a day (QID) | ORAL | Status: DC | PRN

## 2017-11-20 MED ORDER — POTASSIUM CHLORIDE IN NACL 20-0.9 MEQ/L-% IV SOLN
INTRAVENOUS | Status: DC
  Administered 2017-11-21: 02:00:00 via INTRAVENOUS
  Filled 2017-11-20 (×2): qty 1000

## 2017-11-20 MED ORDER — OXYCODONE HCL 5 MG PO TABS
5.0000 mg | ORAL_TABLET | ORAL | Status: DC | PRN
  Administered 2017-11-21 (×2): 5 mg via ORAL
  Filled 2017-11-20 (×3): qty 1

## 2017-11-20 MED ORDER — LORAZEPAM 2 MG/ML IJ SOLN
1.0000 mg | Freq: Four times a day (QID) | INTRAMUSCULAR | Status: DC | PRN
  Administered 2017-11-20 – 2017-11-22 (×3): 1 mg via INTRAVENOUS
  Filled 2017-11-20 (×3): qty 1

## 2017-11-20 MED ORDER — SORBITOL 70 % SOLN
960.0000 mL | TOPICAL_OIL | Freq: Once | ORAL | Status: DC
  Filled 2017-11-20: qty 473

## 2017-11-20 MED ORDER — LACTATED RINGERS IV BOLUS (SEPSIS)
2000.0000 mL | Freq: Once | INTRAVENOUS | Status: AC
  Administered 2017-11-20: 2000 mL via INTRAVENOUS

## 2017-11-20 MED ORDER — SODIUM CHLORIDE 0.9 % IV SOLN
INTRAVENOUS | Status: DC
  Administered 2017-11-20 – 2017-11-21 (×2): via INTRAVENOUS

## 2017-11-20 MED ORDER — ADULT MULTIVITAMIN W/MINERALS CH
1.0000 | ORAL_TABLET | Freq: Every day | ORAL | Status: DC
  Administered 2017-11-21 – 2017-11-22 (×2): 1 via ORAL
  Filled 2017-11-20 (×2): qty 1

## 2017-11-20 MED ORDER — SENNA 8.6 MG PO TABS
1.0000 | ORAL_TABLET | Freq: Two times a day (BID) | ORAL | Status: DC
  Administered 2017-11-21 – 2017-11-22 (×3): 8.6 mg via ORAL
  Filled 2017-11-20 (×4): qty 1

## 2017-11-20 MED ORDER — ACETAMINOPHEN 650 MG RE SUPP
650.0000 mg | Freq: Four times a day (QID) | RECTAL | Status: DC | PRN
Start: 2017-11-20 — End: 2017-11-22

## 2017-11-20 MED ORDER — ONDANSETRON HCL 4 MG/2ML IJ SOLN
4.0000 mg | Freq: Once | INTRAMUSCULAR | Status: AC
  Administered 2017-11-20: 4 mg via INTRAVENOUS
  Filled 2017-11-20: qty 2

## 2017-11-20 MED ORDER — ONDANSETRON HCL 4 MG/2ML IJ SOLN
4.0000 mg | Freq: Four times a day (QID) | INTRAMUSCULAR | Status: DC
  Administered 2017-11-20 – 2017-11-21 (×2): 4 mg via INTRAVENOUS
  Filled 2017-11-20 (×3): qty 2

## 2017-11-20 MED ORDER — MINERAL OIL RE ENEM
1.0000 | ENEMA | Freq: Once | RECTAL | Status: AC
  Administered 2017-11-20: 1 via RECTAL
  Filled 2017-11-20: qty 1

## 2017-11-20 MED ORDER — KETOROLAC TROMETHAMINE 15 MG/ML IJ SOLN
15.0000 mg | Freq: Once | INTRAMUSCULAR | Status: AC
  Administered 2017-11-20: 15 mg via INTRAVENOUS
  Filled 2017-11-20: qty 1

## 2017-11-20 MED ORDER — LORAZEPAM 1 MG PO TABS: 1.0000 mg | ORAL_TABLET | Freq: Four times a day (QID) | ORAL | Status: DC | PRN

## 2017-11-20 MED ORDER — HYDRALAZINE HCL 20 MG/ML IJ SOLN
10.0000 mg | Freq: Four times a day (QID) | INTRAMUSCULAR | Status: DC | PRN
  Administered 2017-11-21: 10 mg via INTRAVENOUS
  Filled 2017-11-20: qty 1

## 2017-11-20 MED ORDER — ONDANSETRON 4 MG PO TBDP
4.0000 mg | ORAL_TABLET | Freq: Once | ORAL | Status: AC
  Administered 2017-11-20: 4 mg via ORAL
  Filled 2017-11-20: qty 1

## 2017-11-20 MED ORDER — MORPHINE SULFATE (PF) 2 MG/ML IV SOLN
2.0000 mg | INTRAVENOUS | Status: DC | PRN
  Administered 2017-11-20 – 2017-11-21 (×2): 2 mg via INTRAVENOUS
  Filled 2017-11-20 (×2): qty 1

## 2017-11-20 MED ORDER — ACETAMINOPHEN 325 MG PO TABS
650.0000 mg | ORAL_TABLET | Freq: Four times a day (QID) | ORAL | Status: DC | PRN
  Administered 2017-11-21 (×2): 650 mg via ORAL
  Filled 2017-11-20 (×3): qty 2

## 2017-11-20 MED ORDER — METOCLOPRAMIDE HCL 5 MG/ML IJ SOLN
10.0000 mg | Freq: Once | INTRAMUSCULAR | Status: AC
  Administered 2017-11-20: 10 mg via INTRAVENOUS
  Filled 2017-11-20: qty 2

## 2017-11-20 MED ORDER — FOLIC ACID 1 MG PO TABS
1.0000 mg | ORAL_TABLET | Freq: Every day | ORAL | Status: DC
  Administered 2017-11-21 – 2017-11-22 (×2): 1 mg via ORAL
  Filled 2017-11-20 (×2): qty 1

## 2017-11-20 MED ORDER — ACETAMINOPHEN 325 MG PO TABS
650.0000 mg | ORAL_TABLET | Freq: Once | ORAL | Status: DC
  Filled 2017-11-20: qty 2

## 2017-11-20 MED ORDER — THIAMINE HCL 100 MG/ML IJ SOLN: 100.0000 mg | Freq: Every day | INTRAMUSCULAR | Status: DC

## 2017-11-20 MED ORDER — HYDROMORPHONE HCL 1 MG/ML IJ SOLN
1.0000 mg | Freq: Once | INTRAMUSCULAR | Status: AC
  Administered 2017-11-20: 1 mg via INTRAVENOUS
  Filled 2017-11-20: qty 1

## 2017-11-20 NOTE — ED Notes (Signed)
Patient transported to X-ray 

## 2017-11-20 NOTE — ED Notes (Signed)
ASSIGNED 16:56 Rm. [id number]. Call report 17:16, room approved 17:10

## 2017-11-20 NOTE — ED Provider Notes (Signed)
[facility]-EMERGENCY DEPT Provider Note   CSN: [id number] Arrival date & time: 11/20/17  [id number]     History   Chief Complaint Chief Complaint  Patient presents with  . Emesis    HPI Brian Harrington is a 71 y.o. male.  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 Patient is postop day #2 from lumbar laminectomy and discectomy at outside hospital. Patient reports that he started having emesis last night, and has been unable to keep any medicines down or able to sleep.  Patient is also having pain in the back.  Patient denies any associated abdominal pain.  He has not had a bowel movement since the surgery, but he does think that he is passing flatus.  Patient is noted to be sleepy.  He has history of COPD.  Patient denies any shortness of breath.   Past Medical History:  Diagnosis Date  . Arthritis   . Back pain, chronic   . CAD (coronary artery disease)   . COPD (chronic obstructive pulmonary disease) (HCC)    on 2L home O2  . Hypertension   . Sarcoidosis of lung (HCC)    reported by patient but no clear documentation of this condition  . Sleep apnea    no CPAP    Patient Active Problem List   Diagnosis Date Noted  . COPD with acute exacerbation (HCC) 07/13/2017  . Neck pain 07/13/2017  . Chronic allergic rhinitis 04/24/2016  . GERD (gastroesophageal reflux disease) 02/28/2016  . COPD (chronic obstructive pulmonary disease) (HCC) 02/28/2016  . Hypercholesterolemia 09/06/2012  . Dizziness 07/06/2012  . S/P partial thyroidectomy 01/06/2012  . SLEEP APNEA, OBSTRUCTIVE 02/11/2011  . HYPERTENSION, BENIGN 12/19/2010  . CAD, NATIVE VESSEL 12/19/2010  . TIA 12/19/2010  . Osteoarthritis 04/09/2009  . Lumbar back pain 04/09/2009    Past Surgical History:  Procedure Laterality Date  . BACK SURGERY    . CARDIAC CATHETERIZATION     bare-metal stent to left anterior  descending  . HAND SURGERY    . NECK SURGERY  06/23/2017  . Skin grafting left leg         Home Medications    Prior to Admission medications   Medication Sig Start Date End Date Taking? Authorizing Provider  albuterol (PROVENTIL) (2.5 MG/3ML) 0.083% nebulizer solution NEBULIZE 1 VIAL EVERY 6 HOURS AS NEEDED 11/03/17  Yes Dettinger, Elige RadonJoshua A, MD  carisoprodol (SOMA) 350 MG tablet Take 1 tablet by mouth 2 to 3 times a day as needed 10/16/17  Yes Dettinger, Elige RadonJoshua A, MD  INCRUSE ELLIPTA 62.5 MCG/INH AEPB USE ONE inhalation ONCE daily 09/08/17  Yes [provider]  metoprolol tartrate (LOPRESSOR) 25 MG tablet Take 1 Tablet by mouth 2 times a day 09/22/17  Yes Dettinger, Elige RadonJoshua A, MD  pantoprazole (PROTONIX) 40 MG tablet TAKE ONE (1) TABLET EACH DAY 11/17/17  Yes Dettinger, Elige RadonJoshua A, MD  PRESCRIPTION MEDICATION 1 patch. Behind the left ear.   Yes [provider]  COMBIVENT RESPIMAT 20-100 MCG/ACT AERS respimat USE 2 PUFFS 4 TIMES DAILY AS NEEDED 11/03/17   Dettinger, Elige RadonJoshua A, MD  INCRUSE ELLIPTA 62.5 MCG/INH AEPB USE 1 INHALATION DAILY 11/03/17   Dettinger, Elige RadonJoshua A, MD  INCRUSE ELLIPTA 62.5 MCG/INH AEPB USE 1 INHALATION DAILY 11/17/17   Dettinger, Elige RadonJoshua A, MD  loratadine (CLARITIN) 10 MG tablet Take 1 Tablet by mouth once daily 05/13/17   Dettinger, Elige RadonJoshua A, MD  oxyCODONE (ROXICODONE) 15 MG immediate release tablet Take 15 mg by mouth every 6 (six) hours as needed for pain. 11/18/17   [provider]  oxyCODONE-acetaminophen (PERCOCET) 10-325 MG tablet Take 1 tablet by mouth 2 (two) times daily as needed for pain. Do not refill until 30 days from prescription date 09/11/17   Dettinger, Elige Radon, MD  oxyCODONE-acetaminophen (PERCOCET) 10-325 MG tablet Take 1 tablet by mouth every 8 (eight) hours as needed for pain. Do not refill until 60 days from prescription date 09/11/17   Dettinger, Elige Radon, MD  oxyCODONE-acetaminophen (PERCOCET) 10-325 MG tablet Take 1 tablet by  mouth every 8 (eight) hours as needed for pain. 09/11/17   Dettinger, Elige Radon, MD  OXYGEN Inhale 2 L into the lungs continuous. 2 liters as needed     [provider]  oxymetazoline (AFRIN) 0.05 % nasal spray Place 1 spray into both nostrils 2 (two) times daily as needed for congestion.     [provider]  sodium chloride (OCEAN) 0.65 % SOLN nasal spray Place 1 spray into both nostrils as needed for congestion. 07/15/17   Arrien, York Ram, MD  SYMBICORT 160-4.5 MCG/ACT inhaler USE 2 PUFFS TWICE DAILY 11/03/17   Dettinger, Elige Radon, MD  tiZANidine (ZANAFLEX) 4 MG tablet TAKE 1 TABLET EVERY 6 HOURS AS NEEDED FOR MUSCLE SPASMS 11/04/17   Dettinger, Elige Radon, MD    Family History Family History  Problem Relation Age of Onset  . Arthritis Mother   . Thrombocytopenia Mother   . Diabetes Brother   . Colon polyps Brother   . Depression Father   . CAD Neg Hx     Social History Social History   Tobacco Use  . Smoking status: Former Smoker    Packs/day: 1.00    Years: 25.00    Pack years: 25.00    Types: Cigarettes    Last attempt to quit: 09/23/1991    Years since quitting: 26.1  . Smokeless tobacco: Never Used  Substance Use Topics  . Alcohol use: Yes    Alcohol/week: 0.5 oz    Types: 1 Standard drinks or equivalent per week  . Drug use: Yes    Types: Marijuana    Comment: Hasn't used recently. Only used for pain management.      Allergies   Amlodipine besylate; Sulfa antibiotics; and Sulfonamide derivatives   Review of Systems Review of Systems  Constitutional: Positive for activity change.  Respiratory: Negative for shortness of breath.   Cardiovascular: Negative for chest pain.  Gastrointestinal: Positive for nausea and vomiting.  Hematological: Does not bruise/bleed easily.  All other systems reviewed and are negative.    Physical Exam Updated Vital Signs BP (!) 165/98   Pulse (!) 107   Temp 97.7 F (36.5 C) (Oral)   Resp 18   Ht 6'  (1.829 m)   Wt 122.5 kg (270 lb)   SpO2 96%   BMI 36.62 kg/m   Physical Exam  Constitutional: He is oriented to person, place, and time. He appears well-developed.  HENT:  Head: Atraumatic.  Dry tongue and mucus membrane  Eyes: EOM are normal.  Neck: Neck supple.  Cardiovascular: Normal rate.  Pulmonary/Chest: Effort normal.  Abdominal: Soft. He exhibits no distension. There is no tenderness.  Neurological: He is alert and oriented to person, place, and time.  Skin: Skin is warm.  Nursing note and vitals reviewed.    ED Treatments / Results  Labs (all labs ordered are listed, but only  abnormal results are displayed) Labs Reviewed  CBC WITH DIFFERENTIAL/PLATELET - Abnormal; Notable for the following components:      Result Value   WBC 18.8 (*)    Hemoglobin 12.9 (*)    HCT 38.7 (*)    Neutro Abs 16.0 (*)    Monocytes Absolute 1.6 (*)    All other components within normal limits  URINALYSIS, ROUTINE W REFLEX MICROSCOPIC - Abnormal; Notable for the following components:   Ketones, ur 20 (*)    All other components within normal limits  COMPREHENSIVE METABOLIC PANEL - Abnormal; Notable for the following components:   Sodium 130 (*)    Chloride 95 (*)    Glucose, Bld 131 (*)    BUN 22 (*)    Calcium 8.7 (*)    Total Bilirubin 1.5 (*)    All other components within normal limits  BLOOD GAS, ARTERIAL    EKG  EKG Interpretation None       Radiology Dg Chest 2 View  Result Date: 11/20/2017 CLINICAL DATA:  71 year old former smoker presenting with acute onset of cough and nausea/vomiting that began 2 days ago. EXAM: CHEST  2 VIEW COMPARISON:  07/13/2017, 07/11/2016 and earlier, including CTA chest 07/20/2015. FINDINGS: AP semi-erect and lateral images were obtained. Suboptimal inspiration. Cardiac silhouette normal in size for AP technique. Hilar and mediastinal contours otherwise unremarkable. Linear atelectasis in the right middle lobe. Prominent bronchovascular  markings diffusely and moderate to marked central peribronchial thickening, more so than on the prior examination in July, 2018. Lungs otherwise clear. No pleural effusions. 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 IMPRESSION: 1. Moderate to severe changes of acute bronchitis and/or asthma without evidence of airspace pneumonia. 2. Suboptimal inspiration which accounts for atelectasis in the right middle lobe. No acute cardiopulmonary disease otherwise. Electronically Signed   By: Hulan Saas M.D.   On: 11/20/2017 11:28    Procedures Procedures (including critical care time)  Medications Ordered in ED Medications  mineral oil enema 1 enema (not administered)  ondansetron (ZOFRAN-ODT) disintegrating tablet 4 mg (not administered)  lactated ringers bolus 2,000 mL (0 mLs Intravenous Stopped 11/20/17 1339)  ondansetron (ZOFRAN) injection 4 mg (4 mg Intravenous Given 11/20/17 1005)  HYDROmorphone (DILAUDID) injection 1 mg (1 mg Intravenous Given 11/20/17 1005)     Initial Impression / Assessment and Plan / ED Course  I have reviewed the triage vital signs and the nursing notes.  Pertinent labs & imaging results that were available during my care of the patient were reviewed by me and considered in my medical decision making (see chart for details).  Clinical Course as of Nov 20 1414  Fri Nov 20, 2017  1325 PO challenge failed. Still somnolent. ABG ordered. Will page [facility], Dr. Tyrone Sage.  Wound site shows no evidence of drainage. WC is elevated, but likely is reactive to the surgery stress and not acute infection. Abd exam is unchanged.  [AN]  1410 Spoke with Dr. Tyrone Sage, he recommends that patient get enema, hydration. If there is no neurosurgical issue, [facility] admission will be appropriate.  [AN]    Clinical Course User Index [AN] Derwood Kaplan, MD    Patient comes in with chief complaint of nausea and vomiting.  Patient has history of CAD, COPD  and is status post laminectomy and discectomy from 2 days ago.  Patient's nausea and emesis could be due to functional ileus as well, given that he has not had a bowel movement.  Patient denies  any abdominal pain, distention and is passing flatus so suspicion for small bowel obstruction is low.   The patient appears dry.  We will get IV fluids on board and check electrolytes.  Patient is also noted to be sleepy.  Lungs are clear, however he has advanced COPD.  If the bicarb is low we will certainly order ABG.   Final Clinical Impressions(s) / ED Diagnoses   Final diagnoses:  Intractable vomiting with nausea, unspecified vomiting type    ED Discharge Orders    None       Derwood KaplanNanavati, Darina Hartwell, MD 11/20/17 1415

## 2017-11-20 NOTE — ED Notes (Signed)
ED TO INPATIENT HANDOFF REPORT  Name/Age/Gender Brian Harrington 71 y.o. male  Code Status Code Status History    Date Active Date Inactive Code Status Order ID Comments User Context   07/13/2017 23:39 07/15/2017 17:25 Full Code [id number]  [name], MD Inpatient      Home/SNF/Other Home  Chief Complaint Back Pain  Level of Care/Admitting Diagnosis ED Disposition    ED Disposition Condition [name]: [facility] [[id number]]  Level of Care: Med-Surg [16]  Diagnosis: Nausea & vomiting [709628]  Admitting Physician: [name]  Attending Physician: [name] [[id number]]  PT Class (Do Not Modify): Observation [104]  PT Acc Code (Do Not Modify): Observation [10022]       Medical History Past Medical History:  Diagnosis Date  . Arthritis   . Back pain, chronic   . CAD (coronary artery disease)   . COPD (chronic obstructive pulmonary disease) (HCC)    on 2L home O2  . Hypertension   . Sarcoidosis of lung ([place])    reported by patient but no clear documentation of this condition  . Sleep apnea    no CPAP    Allergies Allergies  Allergen Reactions  . Amlodipine Besylate Shortness Of Breath and Swelling    REACTION: tongue and facial swelling, difficulty breathing  . Sulfa Antibiotics Nausea And Vomiting  . Sulfonamide Derivatives Nausea And Vomiting    IV Location/Drains/Wounds Patient Lines/Drains/Airways Status   Active Line/Drains/Airways    Name:   Placement date:   Placement time:   Site:   Days:   Peripheral IV 11/20/17 Right Antecubital   11/20/17    1004    Antecubital   less than 1          Labs/Imaging Results for orders placed or performed during the hospital encounter of 11/20/17 (from the past 48 hour(s))  CBC with Differential/Platelet     Status: Abnormal   Collection Time: 11/20/17 10:00 AM  Result Value Ref Range   WBC 18.8 (H) 4.0 - 10.5 K/uL   RBC 4.68 4.22 - 5.81 MIL/uL   Hemoglobin 12.9 (L) 13.0 - 17.0 g/dL   HCT 38.7 (L) 39.0 - 52.0 %   MCV 82.7 78.0 - 100.0 fL   MCH 27.6 26.0 - 34.0 pg   MCHC 33.3 30.0 - 36.0 g/dL   RDW 14.1 11.5 - 15.5 %   Platelets 188 150 - 400 K/uL   Neutrophils Relative % 85 %   Neutro Abs 16.0 (H) 1.7 - 7.7 K/uL   Lymphocytes Relative 6 %   Lymphs Abs 1.1 0.7 - 4.0 K/uL   Monocytes Relative 9 %   Monocytes Absolute 1.6 (H) 0.1 - 1.0 K/uL   Eosinophils Relative 0 %   Eosinophils Absolute 0.0 0.0 - 0.7 K/uL   Basophils Relative 0 %   Basophils Absolute 0.0 0.0 - 0.1 K/uL  Comprehensive metabolic panel     Status: Abnormal   Collection Time: 11/20/17 10:00 AM  Result Value Ref Range   Sodium 130 (L) 135 - 145 mmol/L   Potassium 3.6 3.5 - 5.1 mmol/L   Chloride 95 (L) 101 - 111 mmol/L   CO2 27 22 - 32 mmol/L   Glucose, Bld 131 (H) 65 - 99 mg/dL   BUN 22 (H) 6 - 20 mg/dL   Creatinine, Ser 0.82 0.61 - 1.24 mg/dL   Calcium 8.7 (L) 8.9 - 10.3 mg/dL   Total Protein 7.2 6.5 -  8.1 g/dL   Albumin 3.8 3.5 - 5.0 g/dL   AST 30 15 - 41 U/L   ALT 20 17 - 63 U/L   Alkaline Phosphatase 67 38 - 126 U/L   Total Bilirubin 1.5 (H) 0.3 - 1.2 mg/dL   GFR calc non Af Amer >60 >60 mL/min   GFR calc Af Amer >60 >60 mL/min    Comment: (NOTE) The eGFR has been calculated using the CKD EPI equation. This calculation has not been validated in all clinical situations. eGFR's persistently <60 mL/min signify possible Chronic Kidney Disease.    Anion gap 8 5 - 15  Urinalysis, Routine w reflex microscopic     Status: Abnormal   Collection Time: 11/20/17 12:50 PM  Result Value Ref Range   Color, Urine YELLOW YELLOW   APPearance CLEAR CLEAR   Specific Gravity, Urine 1.019 1.005 - 1.030   pH 6.0 5.0 - 8.0   Glucose, UA NEGATIVE NEGATIVE mg/dL   Hgb urine dipstick NEGATIVE NEGATIVE   Bilirubin Urine NEGATIVE NEGATIVE   Ketones, ur 20 (A) NEGATIVE mg/dL   Protein, ur NEGATIVE NEGATIVE mg/dL   Nitrite NEGATIVE NEGATIVE   Leukocytes, UA NEGATIVE  NEGATIVE  Blood gas, arterial     Status: Abnormal   Collection Time: 11/20/17  2:35 PM  Result Value Ref Range   O2 Content 2.0 L/min   Delivery systems NASAL CANNULA    pH, Arterial 7.403 7.350 - 7.450   pCO2 arterial 46.6 32.0 - 48.0 mmHg   pO2, Arterial 71.1 (L) 83.0 - 108.0 mmHg   Bicarbonate 28.5 (H) 20.0 - 28.0 mmol/L   Acid-Base Excess 3.5 (H) 0.0 - 2.0 mmol/L   O2 Saturation 93.4 %   Patient temperature 98.6    Collection site RIGHT RADIAL    Drawn by [id number]    Sample type ARTERIAL DRAW    Allens test (pass/fail) PASS PASS   Dg Chest 2 View  Result Date: 11/20/2017 CLINICAL DATA:  71 year old former smoker presenting with acute onset of cough and nausea/vomiting that began 2 days ago. EXAM: CHEST  2 VIEW COMPARISON:  07/13/2017, 07/11/2016 and earlier, including CTA chest 07/20/2015. FINDINGS: AP semi-erect and lateral images were obtained. Suboptimal inspiration. Cardiac silhouette normal in size for AP technique. Hilar and mediastinal contours otherwise unremarkable. Linear atelectasis in the right middle lobe. Prominent bronchovascular markings diffusely and moderate to marked central peribronchial thickening, more so than on the prior examination in July, 2018. Lungs otherwise clear. No pleural effusions. 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 IMPRESSION: 1. Moderate to severe changes of acute bronchitis and/or asthma without evidence of airspace pneumonia. 2. Suboptimal inspiration which accounts for atelectasis in the right middle lobe. No acute cardiopulmonary disease otherwise. Electronically Signed   By: [name] M.D.   On: 11/20/2017 11:28   Dg Abd 2 Views  Result Date: 11/20/2017 CLINICAL DATA:  Patient with nausea and vomiting. Recent back surgery. EXAM: ABDOMEN - 2 VIEW COMPARISON:  None. FINDINGS: Gas is demonstrated within nondilated loops of large and small bowel in a nonobstructed pattern. Stool is present throughout  the colon. No definite free intraperitoneal air demonstrated on decubitus images. Lumbar spine degenerative changes. IMPRESSION: Stool throughout the colon as can be seen with constipation. Nonobstructed bowel gas pattern. Electronically Signed   By: [name] M.D.   On: 11/20/2017 16:07    Pending Labs [employer] (From admission, onward)   Start     Ordered  Signed and Held  Basic metabolic panel  Tomorrow morning,   R     Signed and Held   Signed and Held  CBC  Tomorrow morning,   R     Signed and Held      Vitals/Pain Today's Vitals   11/20/17 1456 11/20/17 1526 11/20/17 1627 11/20/17 1653  BP: (!) 195/103   (!) 169/89  Pulse: (!) 104   87  Resp: 18   16  Temp:      TempSrc:      SpO2: 97%   99%  Weight:      Height:      PainSc:  10-Worst pain ever 5      Isolation Precautions No active isolations  Medications Medications  ondansetron (ZOFRAN) injection 4 mg (4 mg Intravenous Given 11/20/17 1651)  0.9 %  sodium chloride infusion ( Intravenous New Bag/Given 11/20/17 1651)  hydrALAZINE (APRESOLINE) injection 10 mg (not administered)  lactated ringers bolus 2,000 mL (0 mLs Intravenous Stopped 11/20/17 1339)  ondansetron (ZOFRAN) injection 4 mg (4 mg Intravenous Given 11/20/17 1005)  HYDROmorphone (DILAUDID) injection 1 mg (1 mg Intravenous Given 11/20/17 1005)  mineral oil enema 1 enema (1 enema Rectal Given 11/20/17 1438)  ondansetron (ZOFRAN-ODT) disintegrating tablet 4 mg (4 mg Oral Given 11/20/17 1420)  ketorolac (TORADOL) 15 MG/ML injection 15 mg (15 mg Intravenous Given 11/20/17 1522)  metoCLOPramide (REGLAN) injection 10 mg (10 mg Intravenous Given 11/20/17 1522)    Mobility walks

## 2017-11-20 NOTE — ED Notes (Signed)
Dr. Rhunette CroftNanavati notified that pt is vomiting.  Dr will come to bedside to assess pt.

## 2017-11-20 NOTE — Care Management Obs Status (Signed)
MEDICARE OBSERVATION STATUS NOTIFICATION   Patient Details  Name: Brian FriendlyJames R Breese MRN: [id number] Date of Birth: 02/27/1946   Medicare Observation Status Notification Given:  Yes    Jazmine Heckman, Lynnae Sandhoffngela N, RN 11/20/2017, 5:16 PM

## 2017-11-20 NOTE — ED Notes (Signed)
Dr. Nanavati at bedside 
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
## 2017-11-20 NOTE — ED Triage Notes (Signed)
Pt complaint of vomiting since back surgery on Wednesday; denies diarrhea.

## 2017-11-21 ENCOUNTER — Observation Stay (HOSPITAL_COMMUNITY): Payer: Medicare HMO

## 2017-11-21 DIAGNOSIS — R51 Headache: Secondary | ICD-10-CM | POA: Diagnosis present

## 2017-11-21 DIAGNOSIS — J449 Chronic obstructive pulmonary disease, unspecified: Secondary | ICD-10-CM | POA: Diagnosis present

## 2017-11-21 DIAGNOSIS — G8929 Other chronic pain: Secondary | ICD-10-CM | POA: Diagnosis not present

## 2017-11-21 DIAGNOSIS — K5900 Constipation, unspecified: Secondary | ICD-10-CM | POA: Diagnosis present

## 2017-11-21 DIAGNOSIS — I10 Essential (primary) hypertension: Secondary | ICD-10-CM | POA: Diagnosis present

## 2017-11-21 DIAGNOSIS — I2510 Atherosclerotic heart disease of native coronary artery without angina pectoris: Secondary | ICD-10-CM | POA: Diagnosis present

## 2017-11-21 DIAGNOSIS — M549 Dorsalgia, unspecified: Secondary | ICD-10-CM | POA: Diagnosis present

## 2017-11-21 DIAGNOSIS — M545 Low back pain: Secondary | ICD-10-CM

## 2017-11-21 DIAGNOSIS — Z888 Allergy status to other drugs, medicaments and biological substances status: Secondary | ICD-10-CM | POA: Diagnosis not present

## 2017-11-21 DIAGNOSIS — R112 Nausea with vomiting, unspecified: Secondary | ICD-10-CM | POA: Diagnosis present

## 2017-11-21 DIAGNOSIS — Z9981 Dependence on supplemental oxygen: Secondary | ICD-10-CM | POA: Diagnosis not present

## 2017-11-21 DIAGNOSIS — Z87891 Personal history of nicotine dependence: Secondary | ICD-10-CM | POA: Diagnosis not present

## 2017-11-21 DIAGNOSIS — G8918 Other acute postprocedural pain: Secondary | ICD-10-CM | POA: Diagnosis present

## 2017-11-21 DIAGNOSIS — Z955 Presence of coronary angioplasty implant and graft: Secondary | ICD-10-CM | POA: Diagnosis not present

## 2017-11-21 DIAGNOSIS — E871 Hypo-osmolality and hyponatremia: Secondary | ICD-10-CM | POA: Diagnosis present

## 2017-11-21 DIAGNOSIS — J9611 Chronic respiratory failure with hypoxia: Secondary | ICD-10-CM | POA: Diagnosis present

## 2017-11-21 DIAGNOSIS — G4733 Obstructive sleep apnea (adult) (pediatric): Secondary | ICD-10-CM | POA: Diagnosis present

## 2017-11-21 DIAGNOSIS — Z79899 Other long term (current) drug therapy: Secondary | ICD-10-CM | POA: Diagnosis not present

## 2017-11-21 DIAGNOSIS — Z6836 Body mass index (BMI) 36.0-36.9, adult: Secondary | ICD-10-CM | POA: Diagnosis not present

## 2017-11-21 DIAGNOSIS — E669 Obesity, unspecified: Secondary | ICD-10-CM | POA: Diagnosis present

## 2017-11-21 DIAGNOSIS — E860 Dehydration: Secondary | ICD-10-CM | POA: Diagnosis present

## 2017-11-21 DIAGNOSIS — D860 Sarcoidosis of lung: Secondary | ICD-10-CM | POA: Diagnosis present

## 2017-11-21 DIAGNOSIS — Z882 Allergy status to sulfonamides status: Secondary | ICD-10-CM | POA: Diagnosis not present

## 2017-11-21 LAB — BASIC METABOLIC PANEL
Anion gap: 9 (ref 5–15)
BUN: 17 mg/dL (ref 6–20)
CHLORIDE: 94 mmol/L — AB (ref 101–111)
CO2: 26 mmol/L (ref 22–32)
Calcium: 8.7 mg/dL — ABNORMAL LOW (ref 8.9–10.3)
Creatinine, Ser: 0.73 mg/dL (ref 0.61–1.24)
GFR calc Af Amer: >60 mL/min (ref >60)
GFR calc non Af Amer: >60 mL/min (ref >60)
GLUCOSE: 124 mg/dL — AB (ref 65–99)
POTASSIUM: 3.7 mmol/L (ref 3.5–5.1)
Sodium: 129 mmol/L — ABNORMAL LOW (ref 135–145)

## 2017-11-21 LAB — CBC
HEMATOCRIT: 39.1 % (ref 39.0–52.0)
Hemoglobin: 12.8 g/dL — ABNORMAL LOW (ref 13.0–17.0)
MCH: 27 pg (ref 26.0–34.0)
MCHC: 32.7 g/dL (ref 30.0–36.0)
MCV: 82.5 fL (ref 78.0–100.0)
Platelets: 184 10*3/uL (ref 150–400)
RBC: 4.74 MIL/uL (ref 4.22–5.81)
RDW: 13.8 % (ref 11.5–15.5)
WBC: 18.6 10*3/uL — ABNORMAL HIGH (ref 4.0–10.5)

## 2017-11-21 MED ORDER — BISACODYL 10 MG RE SUPP
10.0000 mg | Freq: Once | RECTAL | Status: AC
  Administered 2017-11-21: 10 mg via RECTAL
  Filled 2017-11-21: qty 1

## 2017-11-21 MED ORDER — ONDANSETRON HCL 4 MG/2ML IJ SOLN
4.0000 mg | INTRAMUSCULAR | Status: DC | PRN
  Administered 2017-11-21: 4 mg via INTRAVENOUS
  Filled 2017-11-21: qty 2

## 2017-11-21 MED ORDER — LACTULOSE 10 GM/15ML PO SOLN
30.0000 g | ORAL | Status: AC
  Administered 2017-11-21 (×3): 30 g via ORAL
  Filled 2017-11-21 (×3): qty 45

## 2017-11-21 MED ORDER — POLYETHYLENE GLYCOL 3350 17 G PO PACK: 17.0000 g | PACK | Freq: Every day | ORAL | Status: DC
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
## 2017-11-21 NOTE — Progress Notes (Signed)
PT Cancellation Note  Patient Details Name: Brian Harrington MRN: [id number] DOB: 12/24/1946   Cancelled Treatment:    Reason Eval/Treat Not Completed: Patient at procedure or test/unavailable   Brian Harrington, Brian Harrington Elizabeth 11/21/2017, 3:58 PM Blanchard KelchKaren Salvator Seppala PT [phone]

## 2017-11-21 NOTE — Progress Notes (Signed)
Paged Dr. Mariea ClontsEmokpae re: clarification of primary fluid orders. He returned page and changed fluid orders. Updated MD on pt condition and progress. He states he will change Zofran to q4 prn due to pt nausea/vomiting. Dr. Mariea ClontsEmokpae asked this nurse to relay to pt/pt family that head CT came back with good results-"nothing significant" found. Will carry out updated fluid orders and relay head CT results to pt/family.

## 2017-11-21 NOTE — Progress Notes (Signed)
PT Cancellation Note  Patient Details Name: Brian Harrington MRN: [id number] DOB: 10/10/1946   Cancelled Treatment:    Reason Eval/Treat Not Completed: Medical issues which prohibited therapy. Currently resting. [facility] and will be going more later today per sister.Will check back tomorrow,.   Rada HayHill, Brian Harrington Elizabeth 11/21/2017, 4:16 PM Blanchard KelchKaren Leily Capek PT [phone]

## 2017-11-21 NOTE — Progress Notes (Signed)
Elevated BP (See flowsheet). All other vitals WNL. Paged Dr. Mariea ClontsEmokpae. VO to administer Hydralazine. Hydralazine administered and will continue to monitor.

## 2017-11-21 NOTE — Progress Notes (Signed)
CIWA score of 9. Pt received Ativan @ 1430. Will continue to monitor.

## 2017-11-21 NOTE — Progress Notes (Signed)
Since approximately 1930 at beginning of shift on this date, patient's sister at bedside and has numerous times reported that patient is c/o of headache and nausea. Pt constantly requesting ice pack to place at his neck "which helps some." [facility] and [facility].  At approx 2200 pt sister reports that pt is too nauseated to take his medications and that giving the enema at this time might not be a good idea "due to his headache that he continues to have." Pt reports history of having migraine headaches, "but it's been quite a while since I had one." Pt falls asleep easily and snores. Resp even and unlabored, no acute distress noted. Sister remains at constant bedside.

## 2017-11-22 LAB — BASIC METABOLIC PANEL
Anion gap: 10 (ref 5–15)
BUN: 21 mg/dL — ABNORMAL HIGH (ref 6–20)
CALCIUM: 8.9 mg/dL (ref 8.9–10.3)
CHLORIDE: 94 mmol/L — AB (ref 101–111)
CO2: 27 mmol/L (ref 22–32)
CREATININE: 0.67 mg/dL (ref 0.61–1.24)
GFR calc Af Amer: >60 mL/min (ref >60)
GFR calc non Af Amer: >60 mL/min (ref >60)
GLUCOSE: 113 mg/dL — AB (ref 65–99)
Potassium: 3.5 mmol/L (ref 3.5–5.1)
Sodium: 131 mmol/L — ABNORMAL LOW (ref 135–145)

## 2017-11-22 LAB — CBC
HEMATOCRIT: 48.8 % (ref 39.0–52.0)
HEMOGLOBIN: 16.4 g/dL (ref 13.0–17.0)
MCH: 27.5 pg (ref 26.0–34.0)
MCHC: 33.6 g/dL (ref 30.0–36.0)
MCV: 81.7 fL (ref 78.0–100.0)
Platelets: 164 10*3/uL (ref 150–400)
RBC: 5.97 MIL/uL — ABNORMAL HIGH (ref 4.22–5.81)
RDW: 13.7 % (ref 11.5–15.5)
WBC: 15.4 10*3/uL — ABNORMAL HIGH (ref 4.0–10.5)

## 2017-11-22 MED ORDER — IPRATROPIUM-ALBUTEROL 0.5-2.5 (3) MG/3ML IN SOLN
3.0000 mL | Freq: Four times a day (QID) | RESPIRATORY_TRACT | Status: DC
  Administered 2017-11-22 (×2): 3 mL via RESPIRATORY_TRACT
  Filled 2017-11-22 (×2): qty 3

## 2017-11-22 MED ORDER — POLYETHYLENE GLYCOL 3350 17 G PO PACK: 17.0000 g | PACK | Freq: Two times a day (BID) | ORAL | 0 refills | Status: DC

## 2017-11-22 MED ORDER — KETOROLAC TROMETHAMINE 15 MG/ML IJ SOLN
15.0000 mg | Freq: Four times a day (QID) | INTRAMUSCULAR | Status: DC
  Administered 2017-11-22: 15 mg via INTRAVENOUS
  Filled 2017-11-22: qty 1

## 2017-11-22 MED ORDER — DOXYCYCLINE HYCLATE 100 MG PO TABS: 100.0000 mg | ORAL_TABLET | Freq: Two times a day (BID) | ORAL | 0 refills | Status: DC

## 2017-11-22 MED ORDER — IPRATROPIUM-ALBUTEROL 20-100 MCG/ACT IN AERS: 1.0000 | INHALATION_SPRAY | Freq: Four times a day (QID) | RESPIRATORY_TRACT | Status: DC

## 2017-11-22 MED ORDER — ONDANSETRON HCL 4 MG PO TABS: 4.0000 mg | ORAL_TABLET | Freq: Four times a day (QID) | ORAL | 0 refills | Status: DC | PRN

## 2017-11-22 MED ORDER — DOXYCYCLINE HYCLATE 100 MG PO TABS
100.0000 mg | ORAL_TABLET | Freq: Two times a day (BID) | ORAL | Status: DC
  Administered 2017-11-22: 100 mg via ORAL
  Filled 2017-11-22: qty 1

## 2017-11-22 MED ORDER — FLUTICASONE FUROATE-VILANTEROL 200-25 MCG/INH IN AEPB
1.0000 | INHALATION_SPRAY | Freq: Every day | RESPIRATORY_TRACT | Status: DC
  Administered 2017-11-22: 11:00:00 1 via RESPIRATORY_TRACT
  Filled 2017-11-22: qty 28

## 2017-11-22 MED ORDER — DOXYCYCLINE HYCLATE 100 MG PO TABS: 100.0000 mg | ORAL_TABLET | Freq: Two times a day (BID) | ORAL | 0 refills | Status: AC

## 2017-11-22 MED ORDER — METHOCARBAMOL 500 MG PO TABS
750.0000 mg | ORAL_TABLET | Freq: Four times a day (QID) | ORAL | Status: DC
  Administered 2017-11-22 (×2): 750 mg via ORAL
  Filled 2017-11-22 (×2): qty 2

## 2017-11-22 MED ORDER — KETOROLAC TROMETHAMINE 30 MG/ML IJ SOLN
30.0000 mg | Freq: Once | INTRAMUSCULAR | Status: AC
Start: 2017-11-22 — End: 2017-11-22
  Administered 2017-11-22: 30 mg via INTRAVENOUS
  Filled 2017-11-22: qty 1

## 2017-11-22 MED ORDER — METOPROLOL TARTRATE 25 MG PO TABS: 25.0000 mg | ORAL_TABLET | Freq: Two times a day (BID) | ORAL | 1 refills | Status: DC
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
## 2017-11-22 NOTE — Progress Notes (Signed)
Pt's family member approached  nurses's station and requested help to with patient and  that patient had attempted to get out of bed without calling for assistance, "he just sprang up and was down on his knees on the other side of the bed before I knew what was happening." Pt reports that [facility] was [facility] because he "needed to use the commode". Pt had been assisted back to bed by family member prior to staff entering the room and patient was sitting on side of bed when nurses Marchelle FolksAmanda and Page entered the room. They stated that patient was noted to have a small amount of stool on yellow non-slip socks and small "smear" of stool noted in floor. Pt denies any pain or injury. No red marks noted on patient to indicate fall/injury. Dressing to mid-spine (from previous surgery) remains clean dry and intact. Bed linens were noted to be wet from numerous ice packs that patient keeps around his head d/t headache. Bed linens changed. Pt in bed, bed alarm activated, and patient and family member educated on need for alarm at this time.

## 2017-11-22 NOTE — Evaluation (Signed)
Physical Therapy Evaluation Patient Details Name: Brian Harrington MRN: [id number] DOB: 07/20/1946 Today's Date: 11/22/2017   History of Present Illness  [age] yo male admitted with N/V, HA. Fall on 11/25 during hospital stay. Hx of L1-L2 laminectomy 11/18/2017, COPD-O2 PRN, CAD, HTN, OSA, ACDF c5-c6, hardware removal c6-c7.   Clinical Impression  On eval, pt required Min assist for mobility. He performed a stand pivot to/from bsc and took a few steps in room with a RW. O2 sat 95% on RA. Pain in back and head rated 8/10 during session. Pt presents with general weakness, decreased activity tolerance, and impaired gait and balance. Pt lives with another sister who was not present during eval. Per present sister, plan is for pt to return home at discharge. At this time, recommendation is [facility] if [facility] is agreeable. If pt decides to return home, recommend HHPT and 24 hour supervision. Will follow and progress activity as tolerated.     Follow Up Recommendations SNF;Supervision/Assistance - 24 hour(unless family can provide current level of care.  HHPT if pt returns home)    Equipment Recommendations  None recommended by PT    Recommendations for Other Services       Precautions / Restrictions Precautions Precautions: Fall;Back Precaution Comments: recent back surgery. logroll for bed mobility. Restrictions Weight Bearing Restrictions: No      Mobility  Bed Mobility Overal bed mobility: Needs Assistance Bed Mobility: Rolling;Sidelying to Sit;Sit to Sidelying Rolling: Min guard Sidelying to sit: Min guard     Sit to sidelying: Min guard General bed mobility comments: Cues for safety, use of logroll. close guard for safety. Increased time.   Transfers Overall transfer level: Needs assistance Equipment used: Rolling walker (2 wheeled) Transfers: Sit to/from [employer] CorporationStand;Stand Pivot Transfers Sit to Stand: From elevated surface;Min assist Stand pivot transfers: Min assist        General transfer comment: Assist to rise, stabilize, control descent. VCs safety, technique, hand placement. Increased time to rise/descend  Ambulation/Gait Ambulation/Gait assistance: Min assist Ambulation Distance (Feet): 3 Feet Assistive device: Rolling walker (2 wheeled) Gait Pattern/deviations: Step-through pattern;Decreased stride length     General Gait Details: Pt took a few steps in room with a RW. LEs are unstable at times.  Stairs            Wheelchair Mobility    Modified Rankin (Stroke Patients Only)       Balance Overall balance assessment: Needs assistance;History of Falls         Standing balance support: Bilateral upper extremity supported Standing balance-Leahy Scale: Poor                               Pertinent Vitals/Pain Pain Assessment: 0-10 Pain Score: 8  Pain Location: back and headache Pain Descriptors / Indicators: Aching Pain Intervention(s): Limited activity within patient's tolerance;Repositioned;Ice applied    Home Living Family/patient expects to be discharged to:: Private residence Living Arrangements: Other relatives(sister) Available Help at Discharge: Family Type of Home: House Home Access: Stairs to enter   Secretary/administratorntrance Stairs-Number of Steps: 1+1 Home Layout: One level Home Equipment: Environmental consultantWalker - 2 wheels      Prior Function Level of Independence: Independent with assistive device(s)         Comments: using Rw since recent back surgery     Hand Dominance        Extremity/Trunk Assessment   Upper Extremity Assessment Upper Extremity Assessment:  Generalized weakness    Lower Extremity Assessment Lower Extremity Assessment: Generalized weakness    Cervical / Trunk Assessment Cervical / Trunk Assessment: Normal  Communication   Communication: No difficulties  Cognition Arousal/Alertness: Awake/alert Behavior During Therapy: WFL for tasks assessed/performed Overall Cognitive Status: Within  Functional Limits for tasks assessed                                 General Comments: sleeps alot per family      General Comments      Exercises     Assessment/Plan    PT Assessment Patient needs continued PT services  PT Problem List Decreased strength;Decreased balance;Decreased mobility;Decreased activity tolerance;Pain       PT Treatment Interventions DME instruction;Gait training;Functional mobility training;Therapeutic activities;Therapeutic exercise;Patient/family education    PT Goals (Current goals can be found in the Care Plan section)  Acute Rehab PT Goals Patient Stated Goal: less pain PT Goal Formulation: With patient/family Time For Goal Achievement: 12/06/17 Potential to Achieve Goals: Good    Frequency Min 3X/week   Barriers to discharge        Co-evaluation               AM-PAC PT "6 Clicks" Daily Activity  Outcome Measure Difficulty turning over in bed (including adjusting bedclothes, sheets and blankets)?: A Little Difficulty moving from lying on back to sitting on the side of the bed? : A Little Difficulty sitting down on and standing up from a chair with arms (e.g., wheelchair, bedside commode, etc,.)?: Unable Help needed moving to and from a bed to chair (including a wheelchair)?: A Little Help needed walking in hospital room?: A Little Help needed climbing 3-5 steps with a railing? : A Lot 6 Click Score: 15    End of Session   Activity Tolerance: Patient limited by fatigue;Patient limited by pain Patient left: in bed;with call bell/phone within reach;with family/visitor present Nurse Communication: (RN and MD in with pt. Asked RN to reset bed alarm) PT Visit Diagnosis: Muscle weakness (generalized) (M62.81);Difficulty in walking, not elsewhere classified (R26.2);Pain Pain - part of body: (back and head)    Time: [id number] PT Time Calculation (min) (ACUTE ONLY): 19 min   Charges:   PT Evaluation $PT Eval Moderate  Complexity: 1 Mod     PT G Codes:          Brian Harrington, MPT Pager: [phone]

## 2017-11-22 NOTE — Progress Notes (Signed)
Discharge instructions reviewed with patient and 2 sisters. All questions answered. Patient wheeled down to vehicle with belongings by nurse tech.

## 2017-11-22 NOTE — Progress Notes (Signed)
Advised from dayshift that RT had spoken with patient regarding use of CPAP and patient stated that he did not want to use and there was no sense in bringing up a machine.

## 2017-11-22 NOTE — Care Management Note (Signed)
Case Management Note  Patient Details  Name: Brian Harrington MRN: [id number] Date of Birth: 02/28/1946  Subjective/Objective:  Persistent HA, Leukocytosis, back pain                  Action/Plan: Discharge Planning: NCM spoke to sisters and pt at bedside. Offered choice for HH/list provided. [facility] with new [facility]. He will be staying with sister, Thurmond ButtsBecky Tessau address [address], [place] # [phone] or cell # [phone]. Updated [facility]. Has RW and shower stool at home.    PCP Nils PyleETTINGER, JOSHUA A MD  Expected Discharge Date: 11/22/2017              Expected Discharge Plan:  Home w Home Health Services  In-House Referral:  NA  Discharge planning Services  CM Consult  Post Acute Care Choice:  Home Health Choice offered to:  Adult Children  DME Arranged:  N/A DME Agency:  NA  HH Arranged:  PT, OT, Nurse's Aide HH [facility]  [facility]  Status of Service:  Completed, signed off  If discussed at Long Length of Stay Meetings, dates discussed:    Additional Comments:  Elliot CousinShavis, [place] Daisey Ellen, RN 11/22/2017, 2:14 PM

## 2017-11-26 ENCOUNTER — Telehealth: Payer: Self-pay | Admitting: Family Medicine

## 2017-11-26 LAB — CULTURE, BLOOD (ROUTINE X 2)
CULTURE: NO GROWTH
Culture: NO GROWTH
SPECIAL REQUESTS: ADEQUATE
Special Requests: ADEQUATE

## 2017-11-26 NOTE — Telephone Encounter (Signed)
[email or web address]

## 2017-11-26 NOTE — Telephone Encounter (Signed)
Patient states he will call and let us know if he can come tomorrow to talk to a provider about his headaches.

## 2017-12-09 ENCOUNTER — Ambulatory Visit (INDEPENDENT_AMBULATORY_CARE_PROVIDER_SITE_OTHER): Payer: Medicare HMO | Admitting: Family Medicine

## 2017-12-09 ENCOUNTER — Encounter: Payer: Self-pay | Admitting: Family Medicine

## 2017-12-09 VITALS — BP 134/82 | HR 76 | Temp 98.0°F | Ht 72.0 in | Wt 262.0 lb

## 2017-12-09 DIAGNOSIS — M5136 Other intervertebral disc degeneration, lumbar region: Secondary | ICD-10-CM

## 2017-12-09 DIAGNOSIS — E890 Postprocedural hypothyroidism: Secondary | ICD-10-CM

## 2017-12-09 DIAGNOSIS — E7800 Pure hypercholesterolemia, unspecified: Secondary | ICD-10-CM | POA: Diagnosis not present

## 2017-12-09 DIAGNOSIS — F1120 Opioid dependence, uncomplicated: Secondary | ICD-10-CM | POA: Diagnosis not present

## 2017-12-09 DIAGNOSIS — I10 Essential (primary) hypertension: Secondary | ICD-10-CM | POA: Diagnosis not present

## 2017-12-09 DIAGNOSIS — Z9889 Other specified postprocedural states: Secondary | ICD-10-CM

## 2017-12-09 MED ORDER — CARISOPRODOL 350 MG PO TABS: 350.0000 mg | ORAL_TABLET | Freq: Three times a day (TID) | ORAL | 2 refills | Status: DC

## 2017-12-09 MED ORDER — OXYCODONE-ACETAMINOPHEN 10-325 MG PO TABS: 1.0000 | ORAL_TABLET | Freq: Two times a day (BID) | ORAL | 0 refills | Status: DC | PRN

## 2017-12-09 MED ORDER — OXYCODONE-ACETAMINOPHEN 10-325 MG PO TABS: ORAL_TABLET | ORAL | 0 refills | Status: DC

## 2017-12-09 MED ORDER — OXYCODONE HCL 15 MG PO TABS: 15.0000 mg | ORAL_TABLET | Freq: Three times a day (TID) | ORAL | 0 refills | Status: DC | PRN

## 2017-12-09 NOTE — Progress Notes (Signed)
BP 134/82   Pulse 76   Temp 98 F (36.7 C) (Oral)   Ht 6' (1.829 m)   Wt 262 lb (118.8 kg)   BMI 35.53 kg/m    Subjective:    Patient ID: Brian Harrington, male    DOB: [birth year], 71 y.o.   MRN: [id number]  HPI: Brian Harrington is a 71 y.o. male presenting on 12/09/2017 for Hyperlipidemia (3 mo follow up); Hypertension; and Back Pain (had surgery 3 weeks ago)   HPI Hyperlipidemia Patient is coming in for recheck of his hyperlipidemia. The patient is currently taking diet control and has been doing okay, we are monitoring for now. They deny any issues with myalgias or history of liver damage from it. They deny any focal numbness or weakness or chest pain.   Hypertension Patient is currently on metoprolol 25 twice daily, and their blood pressure today is 134/82 and heart rate is 76. Patient denies any lightheadedness or dizziness. Patient denies headaches, blurred vision, chest pains, shortness of breath, or weakness. Denies any side effects from medication and is content with current medication.   Chronic pain status post lumbar surgery Last month patient had a lower back surgery and fusion that has left him with increased pain.  He was given some extra by his surgeon who is Dr. Aline August over at [facility] and is trying to reduce the amount back down to where he is normally but he says he still needs some extra to get him through.  He continues to use the oxycodone tens-325 but has some Moxy 15s from over there and would like to see if he can get another prescription for that in the meantime.  History of thyroid partial removal, will check her labs because it has been sometime since he had it checked.  He denies any symptoms from that.  Relevant past medical, surgical, family and social history reviewed and updated as indicated. Interim medical history since our last visit reviewed. Allergies and medications reviewed and updated.  Review of Systems  Constitutional: Negative for chills and  fever.  Eyes: Negative for discharge.  Respiratory: Negative for shortness of breath and wheezing.   Cardiovascular: Negative for chest pain and leg swelling.  Endocrine: Negative for cold intolerance, heat intolerance, polydipsia and polyuria.  Musculoskeletal: Positive for back pain. Negative for gait problem.  Skin: Negative for rash.  Neurological: Negative for dizziness, weakness and numbness.  All other systems reviewed and are negative.   Per HPI unless specifically indicated above        Objective:    BP 134/82   Pulse 76   Temp 98 F (36.7 C) (Oral)   Ht 6' (1.829 m)   Wt 262 lb (118.8 kg)   BMI 35.53 kg/m   Wt Readings from Last 3 Encounters:  12/09/17 262 lb (118.8 kg)  11/20/17 270 lb (122.5 kg)  09/18/17 282 lb (127.9 kg)    Physical Exam  Constitutional: He is oriented to person, place, and time. He appears well-developed and well-nourished. No distress.  Eyes: Conjunctivae are normal. No scleral icterus.  Neck: Neck supple. No thyromegaly present.  Cardiovascular: Normal rate, regular rhythm, normal heart sounds and intact distal pulses.  No murmur heard. Pulmonary/Chest: Effort normal and breath sounds normal. No respiratory distress. He has no wheezes. He has no rales.  Musculoskeletal: Normal range of motion. He exhibits no edema.  Lymphadenopathy:    He has no cervical adenopathy.  Neurological: He is alert and oriented to  person, place, and time. Coordination normal.  Skin: Skin is warm and dry. No rash noted. He is not diaphoretic.  Psychiatric: He has a normal mood and affect. His behavior is normal.  Nursing note and vitals reviewed.       Assessment & Plan:   Problem List Items Addressed This Visit      Cardiovascular and Mediastinum   HYPERTENSION, BENIGN - Primary   Relevant Orders   BMP8+EGFR     Musculoskeletal and Integument   Degenerative disc disease, lumbar   Relevant Medications   carisoprodol (SOMA) 350 MG tablet    oxyCODONE (ROXICODONE) 15 MG immediate release tablet   oxyCODONE-acetaminophen (PERCOCET) 10-325 MG tablet   oxyCODONE-acetaminophen (PERCOCET) 10-325 MG tablet   oxyCODONE-acetaminophen (PERCOCET) 10-325 MG tablet     Other   S/P partial thyroidectomy   Relevant Orders   TSH   CBC with Differential/Platelet   BMP8+EGFR   Hypercholesterolemia    Other Visit Diagnoses    Uncomplicated opioid dependence ([place])       Relevant Medications   carisoprodol (SOMA) 350 MG tablet   oxyCODONE-acetaminophen (PERCOCET) 10-325 MG tablet   oxyCODONE-acetaminophen (PERCOCET) 10-325 MG tablet   oxyCODONE-acetaminophen (PERCOCET) 10-325 MG tablet       Follow up plan: Return in about 3 months (around 03/09/2018), or if symptoms worsen or fail to improve, for Follow-up pain management cholesterol and hypertension.  Counseling provided for all of the vaccine components Orders Placed This Encounter  Procedures  . TSH  . CBC with Differential/Platelet  . BMP8+EGFR    Caryl Pina, MD [facility] 12/09/2017, 10:44 AM

## 2017-12-10 LAB — CBC WITH DIFFERENTIAL/PLATELET
BASOS: 0 %
Basophils Absolute: 0 10*3/uL (ref 0.0–0.2)
EOS (ABSOLUTE): 0.2 10*3/uL (ref 0.0–0.4)
Eos: 4 %
Hematocrit: 40.7 % (ref 37.5–51.0)
Hemoglobin: 13.3 g/dL (ref 13.0–17.7)
IMMATURE GRANS (ABS): 0 10*3/uL (ref 0.0–0.1)
IMMATURE GRANULOCYTES: 0 %
LYMPHS: 31 %
Lymphocytes Absolute: 1.5 10*3/uL (ref 0.7–3.1)
MCH: 26.8 pg (ref 26.6–33.0)
MCHC: 32.7 g/dL (ref 31.5–35.7)
MCV: 82 fL (ref 79–97)
Monocytes Absolute: 0.5 10*3/uL (ref 0.1–0.9)
Monocytes: 11 %
NEUTROS PCT: 54 %
Neutrophils Absolute: 2.6 10*3/uL (ref 1.4–7.0)
PLATELETS: 299 10*3/uL (ref 150–379)
RBC: 4.97 x10E6/uL (ref 4.14–5.80)
RDW: 14.9 % (ref 12.3–15.4)
WBC: 4.9 10*3/uL (ref 3.4–10.8)

## 2017-12-10 LAB — BMP8+EGFR
BUN/Creatinine Ratio: 16 (ref 10–24)
BUN: 15 mg/dL (ref 8–27)
CALCIUM: 9.2 mg/dL (ref 8.6–10.2)
CHLORIDE: 99 mmol/L (ref 96–106)
CO2: 25 mmol/L (ref 20–29)
Creatinine, Ser: 0.91 mg/dL (ref 0.76–1.27)
GFR calc non Af Amer: 86 mL/min/{1.73_m2} (ref >59)
GFR, EST AFRICAN AMERICAN: 99 mL/min/{1.73_m2} (ref >59)
Glucose: 96 mg/dL (ref 65–99)
Potassium: 4.4 mmol/L (ref 3.5–5.2)
Sodium: 139 mmol/L (ref 134–144)

## 2017-12-10 LAB — TSH: TSH: 2.53 u[IU]/mL (ref 0.450–4.500)

## 2017-12-16 IMAGING — CR DG ABDOMEN 2V
4 series · 4 of 4 positions shown · non-contrast
Comparison: None.

CLINICAL DATA: Patient with nausea and vomiting. Recent back
surgery.

EXAM:
ABDOMEN - 2 VIEW

[x abdomen supine (1 of 2)]
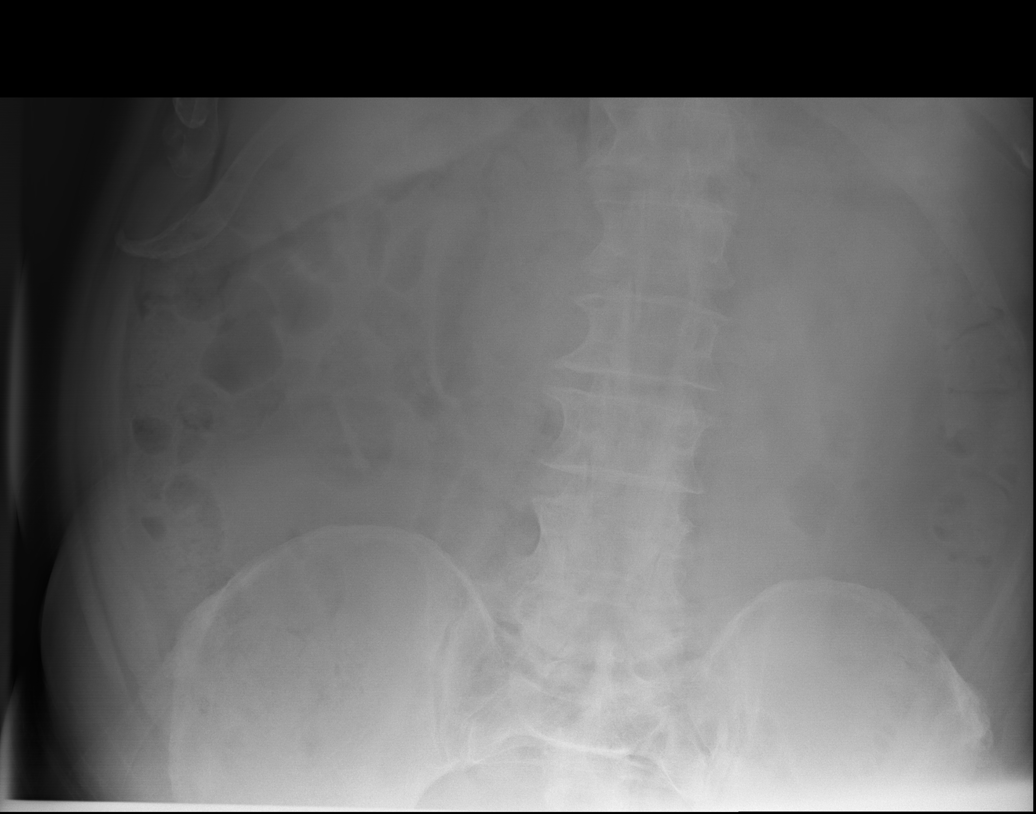

[x abdomen supine (2 of 2)]
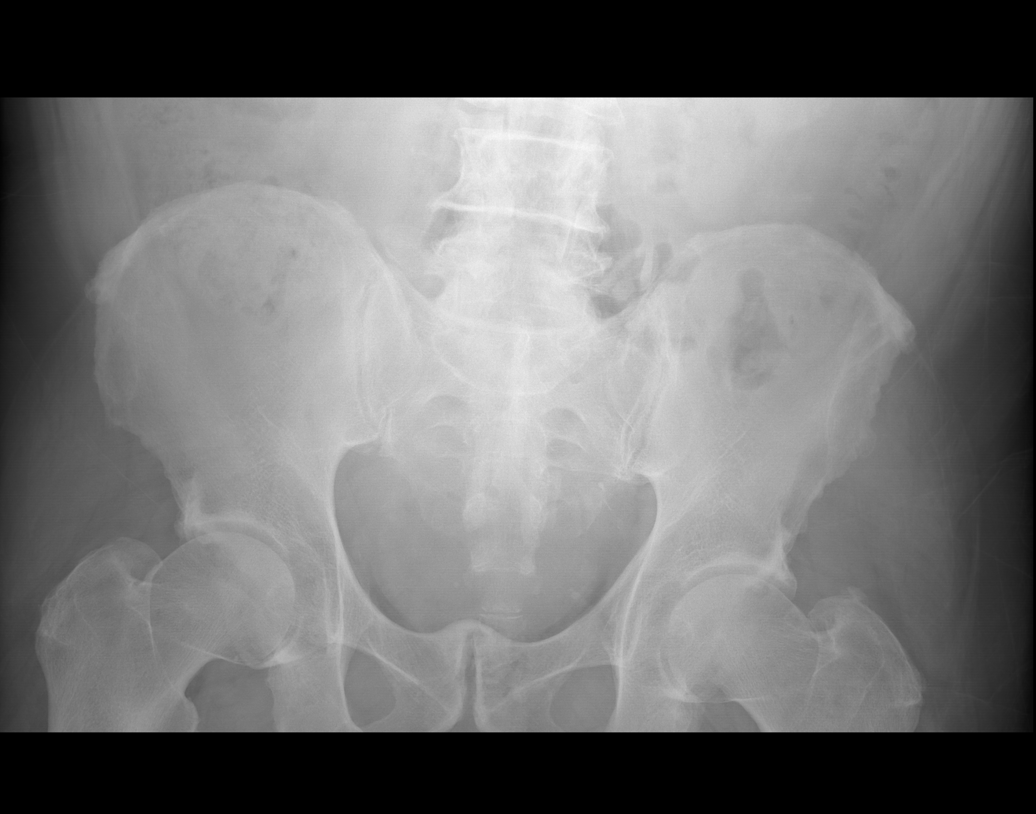

[w abdomen decub (1 of 2)]
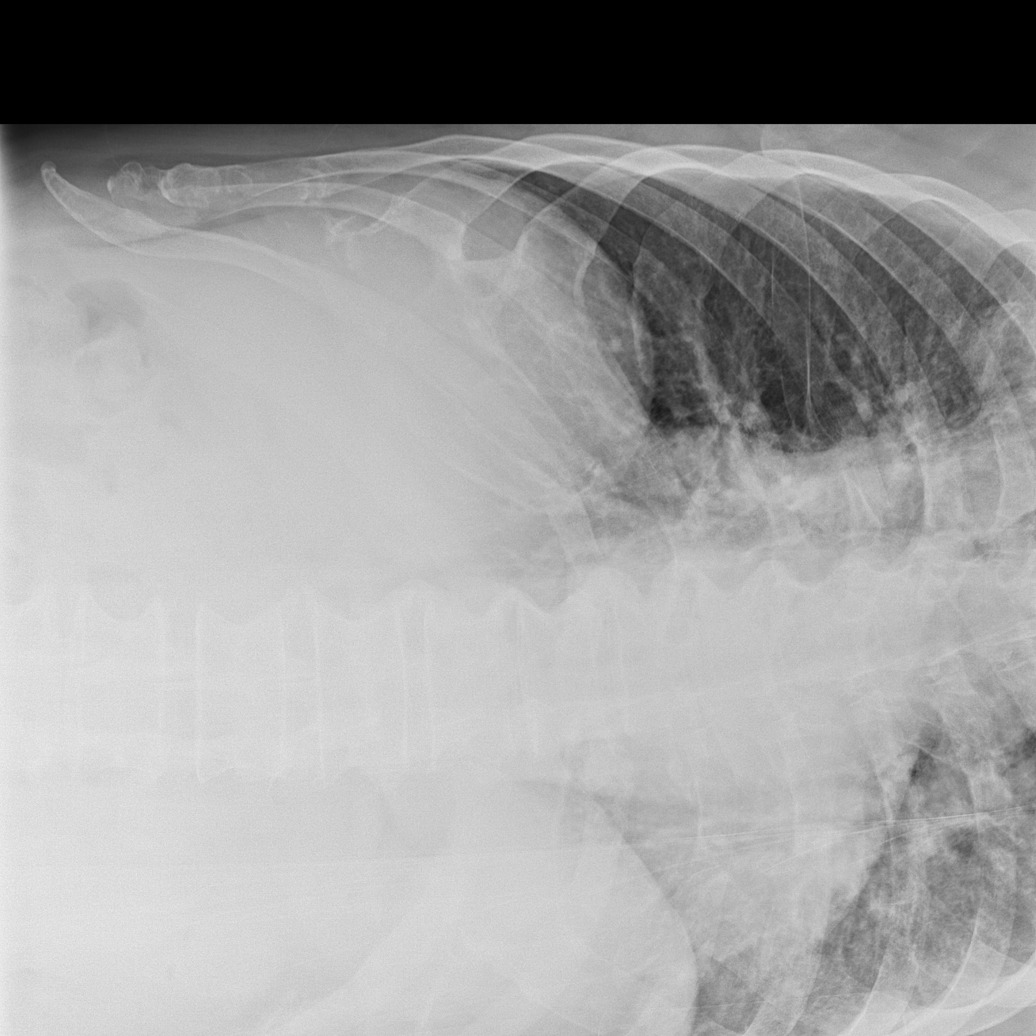

[w abdomen decub (2 of 2)]
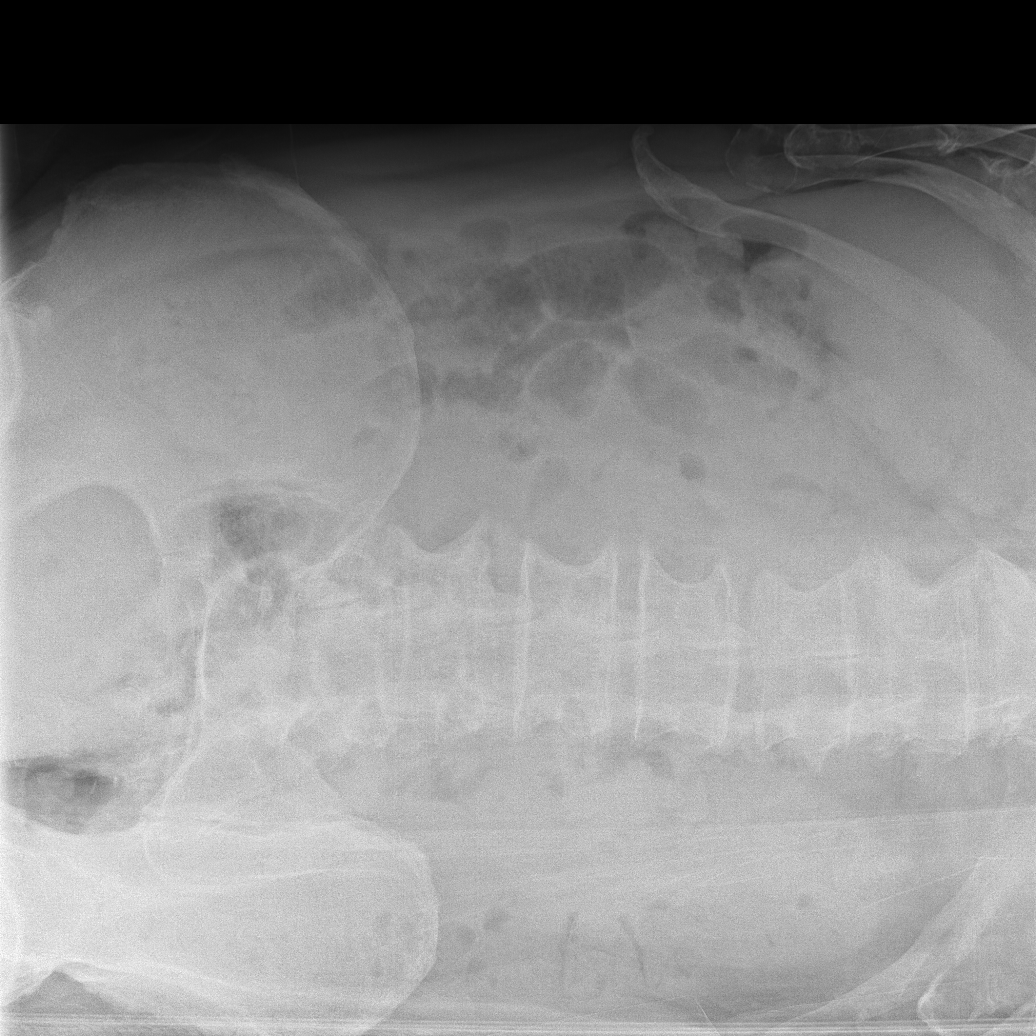

[4 of 4 positions shown; findings below may reference images not displayed]

FINDINGS: Gas is demonstrated within nondilated loops of large and small bowel
in a nonobstructed pattern. Stool is present throughout the colon.
No definite free intraperitoneal air demonstrated on decubitus
images. Lumbar spine degenerative changes.
IMPRESSION: Stool throughout the colon as can be seen with constipation.

Nonobstructed bowel gas pattern.

## 2017-12-18 DIAGNOSIS — D869 Sarcoidosis, unspecified: Secondary | ICD-10-CM | POA: Diagnosis not present

## 2018-01-07 ENCOUNTER — Other Ambulatory Visit: Payer: Self-pay | Admitting: Family Medicine

## 2018-01-18 DIAGNOSIS — D869 Sarcoidosis, unspecified: Secondary | ICD-10-CM | POA: Diagnosis not present

## 2018-01-29 DIAGNOSIS — D869 Sarcoidosis, unspecified: Secondary | ICD-10-CM | POA: Diagnosis not present

## 2018-02-07 ENCOUNTER — Other Ambulatory Visit: Payer: Self-pay | Admitting: Family Medicine

## 2018-02-07 DIAGNOSIS — J439 Emphysema, unspecified: Secondary | ICD-10-CM

## 2018-02-18 DIAGNOSIS — D869 Sarcoidosis, unspecified: Secondary | ICD-10-CM | POA: Diagnosis not present

## 2018-03-10 ENCOUNTER — Encounter: Payer: Self-pay | Admitting: Family Medicine

## 2018-03-10 ENCOUNTER — Ambulatory Visit (INDEPENDENT_AMBULATORY_CARE_PROVIDER_SITE_OTHER): Payer: Medicare HMO | Admitting: Family Medicine

## 2018-03-10 VITALS — BP 135/83 | HR 59 | Temp 96.8°F | Ht 72.0 in | Wt 270.2 lb

## 2018-03-10 DIAGNOSIS — M5136 Other intervertebral disc degeneration, lumbar region: Secondary | ICD-10-CM | POA: Diagnosis not present

## 2018-03-10 DIAGNOSIS — J441 Chronic obstructive pulmonary disease with (acute) exacerbation: Secondary | ICD-10-CM | POA: Diagnosis not present

## 2018-03-10 DIAGNOSIS — K219 Gastro-esophageal reflux disease without esophagitis: Secondary | ICD-10-CM | POA: Diagnosis not present

## 2018-03-10 DIAGNOSIS — F1120 Opioid dependence, uncomplicated: Secondary | ICD-10-CM

## 2018-03-10 DIAGNOSIS — I10 Essential (primary) hypertension: Secondary | ICD-10-CM | POA: Diagnosis not present

## 2018-03-10 MED ORDER — AZITHROMYCIN 250 MG PO TABS: ORAL_TABLET | ORAL | 0 refills | Status: DC

## 2018-03-10 MED ORDER — PREDNISONE 20 MG PO TABS
ORAL_TABLET | ORAL | 0 refills | Status: DC
Start: 2018-03-10 — End: 2018-06-07

## 2018-03-10 MED ORDER — OXYCODONE-ACETAMINOPHEN 10-325 MG PO TABS: ORAL_TABLET | ORAL | 0 refills | Status: AC

## 2018-03-10 MED ORDER — OXYCODONE-ACETAMINOPHEN 10-325 MG PO TABS: ORAL_TABLET | ORAL | 0 refills | Status: DC

## 2018-03-10 NOTE — Progress Notes (Signed)
BP 135/83   Pulse (!) 59   Temp (!) 96.8 F (36 C) (Oral)   Ht 6' (1.829 m)   Wt 270 lb 3.2 oz (122.6 kg)   BMI 36.65 kg/m    Subjective:    Patient ID: Brian Harrington, male    DOB: 05/25/1946, 72 y.o.   MRN: [id number]  HPI: Brian Harrington is a 72 y.o. male presenting on 03/10/2018 for Hypertension (3 month follow up)   HPI Hypertension Patient is currently on Toprol, and their blood pressure today is 135/83. Patient denies any lightheadedness or dizziness. Patient denies headaches, blurred vision, chest pains, shortness of breath, or weakness. Denies any side effects from medication and is content with current medication.   GERD Patient is currently on Protonix.  She denies any major symptoms or abdominal pain or belching or burping. She denies any blood in her stool or lightheadedness or dizziness.  COPD Patient is coming in for COPD recheck today.  He is currently on Symbicort and increase and pro-air.  He has a mild chronic cough but denies any major coughing spells or wheezing spells.  He has 2nighttime symptoms per week and 3daytime symptoms per week currently over the past week..   Degenerative lumbar disease and chronic pain Chronic low back pain is what patient is coming in for.  He is currently on oxycodone, recently had surgery and has been doing better with that.  He denies any worsening but says it has been slightly improving except for his recent illness when he had the flu a couple weeks ago.  He is still recovering from that he has not been able to do the rehab after the surgery.  Relevant past medical, surgical, family and social history reviewed and updated as indicated. Interim medical history since our last visit reviewed. Allergies and medications reviewed and updated.  Review of Systems  Constitutional: Negative for chills and fever.  HENT: Positive for congestion. Negative for sinus pressure, sinus pain and sore throat.   Respiratory: Positive for cough and  wheezing. Negative for shortness of breath.   Cardiovascular: Negative for chest pain and leg swelling.  Musculoskeletal: Positive for back pain. Negative for gait problem.  Skin: Negative for rash.  All other systems reviewed and are negative.   Per HPI unless specifically indicated above   Allergies as of 03/10/2018      Reactions   Amlodipine Besylate Shortness Of Breath, Swelling   REACTION: tongue and facial swelling, difficulty breathing   Sulfa Antibiotics Nausea And Vomiting   Sulfonamide Derivatives Nausea And Vomiting      Medication List        Accurate as of 03/10/18  9:41 AM. Always use your most recent med list.          albuterol (2.5 MG/3ML) 0.083% nebulizer solution Commonly known as:  PROVENTIL NEBULIZE 1 VIAL EVERY 6 HOURS AS NEEDED   azithromycin 250 MG tablet Commonly known as:  ZITHROMAX Take 2 the first day and then one each day after.   carisoprodol 350 MG tablet Commonly known as:  SOMA Take 1 tablet (350 mg total) by mouth 3 (three) times daily.   COMBIVENT RESPIMAT 20-100 MCG/ACT Aers respimat Generic drug:  Ipratropium-Albuterol USE 2 PUFFS 4 TIMES DAILY AS NEEDED   INCRUSE ELLIPTA 62.5 MCG/INH Aepb Generic drug:  umeclidinium bromide USE ONE inhalation ONCE daily   INCRUSE ELLIPTA 62.5 MCG/INH Aepb Generic drug:  umeclidinium bromide USE 1 INHALATION DAILY   loratadine 10  MG tablet Commonly known as:  CLARITIN Take 1 Tablet by mouth once daily   metoprolol tartrate 25 MG tablet Commonly known as:  LOPRESSOR TAKE ONE TABLET BY MOUTH TWICE DAILY   mupirocin ointment 2 % Commonly known as:  BACTROBAN Place 1 application into the nose. FOR 5 DAYS   ondansetron 4 MG tablet Commonly known as:  ZOFRAN Take 1 tablet (4 mg total) by mouth every 6 (six) hours as needed for nausea.   oxyCODONE-acetaminophen 10-325 MG tablet Commonly known as:  PERCOCET 1 tablet 2-3 times daily as needed, 75 must last 30 days     oxyCODONE-acetaminophen 10-325 MG tablet Commonly known as:  PERCOCET 1 tablet 2-3 times daily as needed, 75 must last 30 days Do not refill until 30 days from prescription date Start taking on:  04/10/2018   oxyCODONE-acetaminophen 10-325 MG tablet Commonly known as:  PERCOCET 1 tablet 2-3 times daily as needed, 75 must last 30 days Do not refill until 60 days from prescription date Start taking on:  05/10/2018   OXYGEN Inhale 2 L into the lungs continuous. 2 liters as needed   pantoprazole 40 MG tablet Commonly known as:  PROTONIX TAKE ONE (1) TABLET EACH DAY   polyethylene glycol packet Commonly known as:  MIRALAX / GLYCOLAX Take 17 g by mouth 2 (two) times daily.   predniSONE 20 MG tablet Commonly known as:  DELTASONE 2 po at same time daily for 5 days   PRESCRIPTION MEDICATION 1 patch. Behind the left ear.   sodium chloride 0.65 % Soln nasal spray Commonly known as:  OCEAN Place 1 spray into both nostrils as needed for congestion.   SYMBICORT 160-4.5 MCG/ACT inhaler Generic drug:  budesonide-formoterol USE 2 PUFFS TWICE DAILY          Objective:    BP 135/83   Pulse (!) 59   Temp (!) 96.8 F (36 C) (Oral)   Ht 6' (1.829 m)   Wt 270 lb 3.2 oz (122.6 kg)   BMI 36.65 kg/m   Wt Readings from Last 3 Encounters:  03/10/18 270 lb 3.2 oz (122.6 kg)  12/09/17 262 lb (118.8 kg)  11/20/17 270 lb (122.5 kg)    Physical Exam  Constitutional: He is oriented to person, place, and time. He appears well-developed and well-nourished. No distress.  HENT:  Right Ear: Tympanic membrane, external ear and ear canal normal.  Left Ear: Tympanic membrane, external ear and ear canal normal.  Nose: Mucosal edema and rhinorrhea present. No sinus tenderness. No epistaxis. Right sinus exhibits no maxillary sinus tenderness and no frontal sinus tenderness. Left sinus exhibits no maxillary sinus tenderness and no frontal sinus tenderness.  Mouth/Throat: Uvula is midline and mucous  membranes are normal. Posterior oropharyngeal edema and posterior oropharyngeal erythema present. No oropharyngeal exudate or tonsillar abscesses.  Eyes: Conjunctivae and EOM are normal. Pupils are equal, round, and reactive to light. Right eye exhibits no discharge. No scleral icterus.  Neck: Neck supple. No thyromegaly present.  Cardiovascular: Normal rate, regular rhythm, normal heart sounds and intact distal pulses.  No murmur heard. Pulmonary/Chest: Effort normal and breath sounds normal. No respiratory distress. He has no wheezes. He has no rales.  Musculoskeletal: Normal range of motion. He exhibits no edema.  Lymphadenopathy:    He has no cervical adenopathy.  Neurological: He is alert and oriented to person, place, and time. Coordination normal.  Skin: Skin is warm and dry. No rash noted. He is not diaphoretic.  Psychiatric: He  has a normal mood and affect. His behavior is normal.  Nursing note and vitals reviewed.       Assessment & Plan:   Problem List Items Addressed This Visit      Cardiovascular and Mediastinum   HYPERTENSION, BENIGN     Respiratory   COPD with acute exacerbation (HCC) - Primary   Relevant Medications   predniSONE (DELTASONE) 20 MG tablet   azithromycin (ZITHROMAX) 250 MG tablet     Digestive   GERD (gastroesophageal reflux disease)     Musculoskeletal and Integument   Degenerative disc disease, lumbar   Relevant Medications   oxyCODONE-acetaminophen (PERCOCET) 10-325 MG tablet   oxyCODONE-acetaminophen (PERCOCET) 10-325 MG tablet (Start on 05/10/2018)   oxyCODONE-acetaminophen (PERCOCET) 10-325 MG tablet (Start on 04/10/2018)   predniSONE (DELTASONE) 20 MG tablet    Other Visit Diagnoses    Uncomplicated opioid dependence (HCC)       Relevant Medications   oxyCODONE-acetaminophen (PERCOCET) 10-325 MG tablet   oxyCODONE-acetaminophen (PERCOCET) 10-325 MG tablet (Start on 05/10/2018)   oxyCODONE-acetaminophen (PERCOCET) 10-325 MG tablet (Start  on 04/10/2018)       Follow up plan: Return in about 3 months (around 06/10/2018), or if symptoms worsen or fail to improve, for Hypertension and COPD recheck and pain management.  Counseling provided for all of the vaccine components No orders of the defined types were placed in this encounter.   Arville Care, MD [facility] 03/10/2018, 9:41 AM

## 2018-03-12 ENCOUNTER — Other Ambulatory Visit: Payer: Self-pay | Admitting: Family Medicine

## 2018-03-12 DIAGNOSIS — F1120 Opioid dependence, uncomplicated: Secondary | ICD-10-CM

## 2018-03-12 DIAGNOSIS — M5136 Other intervertebral disc degeneration, lumbar region: Secondary | ICD-10-CM

## 2018-03-18 DIAGNOSIS — D869 Sarcoidosis, unspecified: Secondary | ICD-10-CM | POA: Diagnosis not present

## 2018-04-12 ENCOUNTER — Other Ambulatory Visit: Payer: Self-pay | Admitting: Family Medicine

## 2018-04-12 DIAGNOSIS — M5136 Other intervertebral disc degeneration, lumbar region: Secondary | ICD-10-CM

## 2018-04-12 DIAGNOSIS — F1120 Opioid dependence, uncomplicated: Secondary | ICD-10-CM

## 2018-04-18 DIAGNOSIS — D869 Sarcoidosis, unspecified: Secondary | ICD-10-CM | POA: Diagnosis not present

## 2018-05-08 ENCOUNTER — Other Ambulatory Visit: Payer: Self-pay | Admitting: Family Medicine

## 2018-05-08 DIAGNOSIS — M5136 Other intervertebral disc degeneration, lumbar region: Secondary | ICD-10-CM

## 2018-05-08 DIAGNOSIS — F1120 Opioid dependence, uncomplicated: Secondary | ICD-10-CM

## 2018-05-10 NOTE — Telephone Encounter (Signed)
Last seen 03/10/18  Dr D

## 2018-05-11 ENCOUNTER — Other Ambulatory Visit: Payer: Self-pay | Admitting: Family Medicine

## 2018-05-11 DIAGNOSIS — M5136 Other intervertebral disc degeneration, lumbar region: Secondary | ICD-10-CM

## 2018-05-11 DIAGNOSIS — F1120 Opioid dependence, uncomplicated: Secondary | ICD-10-CM

## 2018-05-18 DIAGNOSIS — D869 Sarcoidosis, unspecified: Secondary | ICD-10-CM | POA: Diagnosis not present

## 2018-06-07 ENCOUNTER — Encounter: Payer: Self-pay | Admitting: Family Medicine

## 2018-06-07 ENCOUNTER — Ambulatory Visit (INDEPENDENT_AMBULATORY_CARE_PROVIDER_SITE_OTHER): Payer: Medicare HMO | Admitting: Family Medicine

## 2018-06-07 ENCOUNTER — Telehealth: Payer: Self-pay | Admitting: Family Medicine

## 2018-06-07 VITALS — BP 133/76 | HR 60 | Temp 99.8°F | Ht 72.0 in | Wt 259.0 lb

## 2018-06-07 DIAGNOSIS — K219 Gastro-esophageal reflux disease without esophagitis: Secondary | ICD-10-CM

## 2018-06-07 DIAGNOSIS — E890 Postprocedural hypothyroidism: Secondary | ICD-10-CM

## 2018-06-07 DIAGNOSIS — E7800 Pure hypercholesterolemia, unspecified: Secondary | ICD-10-CM

## 2018-06-07 DIAGNOSIS — J0110 Acute frontal sinusitis, unspecified: Secondary | ICD-10-CM | POA: Diagnosis not present

## 2018-06-07 DIAGNOSIS — F1120 Opioid dependence, uncomplicated: Secondary | ICD-10-CM | POA: Diagnosis not present

## 2018-06-07 DIAGNOSIS — M5136 Other intervertebral disc degeneration, lumbar region: Secondary | ICD-10-CM

## 2018-06-07 DIAGNOSIS — M51369 Other intervertebral disc degeneration, lumbar region without mention of lumbar back pain or lower extremity pain: Secondary | ICD-10-CM

## 2018-06-07 DIAGNOSIS — Z9889 Other specified postprocedural states: Secondary | ICD-10-CM

## 2018-06-07 DIAGNOSIS — I10 Essential (primary) hypertension: Secondary | ICD-10-CM

## 2018-06-07 DIAGNOSIS — J439 Emphysema, unspecified: Secondary | ICD-10-CM | POA: Diagnosis not present

## 2018-06-07 MED ORDER — OXYCODONE-ACETAMINOPHEN 10-325 MG PO TABS: 1.0000 | ORAL_TABLET | Freq: Three times a day (TID) | ORAL | 0 refills | Status: DC | PRN

## 2018-06-07 MED ORDER — AMOXICILLIN-POT CLAVULANATE 875-125 MG PO TABS: 1.0000 | ORAL_TABLET | Freq: Two times a day (BID) | ORAL | 0 refills | Status: DC

## 2018-06-07 MED ORDER — FLUTICASONE PROPIONATE 50 MCG/ACT NA SUSP: 1.0000 | Freq: Two times a day (BID) | NASAL | 6 refills | Status: DC | PRN

## 2018-06-07 MED ORDER — CARISOPRODOL 350 MG PO TABS: 350.0000 mg | ORAL_TABLET | Freq: Three times a day (TID) | ORAL | 2 refills | Status: DC | PRN

## 2018-06-07 MED ORDER — OXYCODONE-ACETAMINOPHEN 10-325 MG PO TABS: ORAL_TABLET | ORAL | 0 refills | Status: AC

## 2018-06-07 MED ORDER — SILDENAFIL CITRATE 20 MG PO TABS: 20.0000 mg | ORAL_TABLET | Freq: Every day | ORAL | 0 refills | Status: DC | PRN

## 2018-06-07 NOTE — Telephone Encounter (Signed)
I just sent it

## 2018-06-07 NOTE — Progress Notes (Signed)
BP 133/76   Pulse 60   Temp 99.8 F (37.7 C) (Oral)   Ht 6' (1.829 m)   Wt 259 lb (117.5 kg)   BMI 35.13 kg/m    Subjective:    Patient ID: Brian Harrington, male    DOB: Jul 21, 1946, 72 y.o.   MRN: [id number]  HPI: [name] is a 72 y.o. male presenting on 06/07/2018 for Hypertension; Gastroesophageal Reflux; and Sinusitis (for several weeks, nasal congestion, headache, ears feel full)   HPI Hypothyroidism recheck Patient is coming in for thyroid recheck today as well. They deny any issues with hair changes or heat or cold problems or diarrhea or constipation. They deny any chest pain or palpitations. They are currently on no medication that we have record of.  Hyperlipidemia Patient is coming in for recheck of his hyperlipidemia. The patient is currently taking no medication but has been intolerant of statins previously. They deny any issues with myalgias or history of liver damage from it. They deny any focal numbness or weakness or chest pain.   GERD Patient is currently on Protonix.  She denies any major symptoms or abdominal pain or belching or burping. She denies any blood in her stool or lightheadedness or dizziness.   COPD Patient is coming in for COPD recheck today.  He is currently on Symbicort.  He has a mild chronic cough but denies any major coughing spells or wheezing spells.  He has 0nighttime symptoms per week and 0daytime symptoms per week currently.  Currently has some sinus congestion and pressure in his head and drainage but denies any but going down to his lungs is at this point and denies any wheezing or shortness of breath currently more than his baseline.  He does have his chronic cough but denies that being worse than what he normally is mainly just has sinus pressure and headache  Chronic low back pain Patient has chronic low back pain that he is currently taking oxycodone 3 times a day for.  He says is been doing well on this dose and denies any major  issues.  Occasionally he will get flareups but most the time he does pretty well.  Denies any pain radiating anywhere else.  He denies any numbness or weakness.  Relevant past medical, surgical, family and social history reviewed and updated as indicated. Interim medical history since our last visit reviewed. Allergies and medications reviewed and updated.  Review of Systems  Constitutional: Negative for chills and fever.  HENT: Negative for congestion.   Respiratory: Negative for cough, shortness of breath and wheezing.   Cardiovascular: Negative for chest pain and leg swelling.  Musculoskeletal: Positive for back pain. Negative for gait problem, joint swelling and neck stiffness.  Skin: Negative for rash.  Neurological: Negative for dizziness, weakness and light-headedness.  All other systems reviewed and are negative.   Per HPI unless specifically indicated above   Allergies as of 06/07/2018      Reactions   Amlodipine Besylate Shortness Of Breath, Swelling   REACTION: tongue and facial swelling, difficulty breathing   Sulfa Antibiotics Nausea And Vomiting   Sulfonamide Derivatives Nausea And Vomiting      Medication List        Accurate as of 06/07/18  2:38 PM. Always use your most recent med list.          albuterol (2.5 MG/3ML) 0.083% nebulizer solution Commonly known as:  PROVENTIL NEBULIZE 1 VIAL EVERY 6 HOURS AS NEEDED   amoxicillin-clavulanate  875-125 MG tablet Commonly known as:  AUGMENTIN Take 1 tablet by mouth 2 (two) times daily.   carisoprodol 350 MG tablet Commonly known as:  SOMA Take 1 tablet (350 mg total) by mouth 3 (three) times daily as needed. for muscle spams   COMBIVENT RESPIMAT 20-100 MCG/ACT Aers respimat Generic drug:  Ipratropium-Albuterol USE 2 PUFFS 4 TIMES DAILY AS NEEDED   INCRUSE ELLIPTA 62.5 MCG/INH Aepb Generic drug:  umeclidinium bromide USE 1 INHALATION DAILY   loratadine 10 MG tablet Commonly known as:  CLARITIN Take 1  Tablet by mouth once daily   metoprolol tartrate 25 MG tablet Commonly known as:  LOPRESSOR TAKE ONE TABLET BY MOUTH TWICE DAILY   mupirocin ointment 2 % Commonly known as:  BACTROBAN Place 1 application into the nose. FOR 5 DAYS   ondansetron 4 MG tablet Commonly known as:  ZOFRAN Take 1 tablet (4 mg total) by mouth every 6 (six) hours as needed for nausea.   oxyCODONE-acetaminophen 10-325 MG tablet Commonly known as:  PERCOCET 1 tablet 2-3 times daily as needed, 75 must last 30 days Do not refill until 60 days from prescription date   oxyCODONE-acetaminophen 10-325 MG tablet Commonly known as:  PERCOCET Take 1 tablet by mouth every 8 (eight) hours as needed for pain.   oxyCODONE-acetaminophen 10-325 MG tablet Commonly known as:  PERCOCET Take 1 tablet by mouth every 8 (eight) hours as needed for pain. Please don't refill until 30 days from prescription date   OXYGEN Inhale 2 L into the lungs continuous. 2 liters as needed   pantoprazole 40 MG tablet Commonly known as:  PROTONIX TAKE ONE (1) TABLET EACH DAY   polyethylene glycol packet Commonly known as:  MIRALAX / GLYCOLAX Take 17 g by mouth 2 (two) times daily.   PRESCRIPTION MEDICATION 1 patch. Behind the left ear.   sodium chloride 0.65 % Soln nasal spray Commonly known as:  OCEAN Place 1 spray into both nostrils as needed for congestion.   SYMBICORT 160-4.5 MCG/ACT inhaler Generic drug:  budesonide-formoterol USE 2 PUFFS TWICE DAILY          Objective:    BP 133/76   Pulse 60   Temp 99.8 F (37.7 C) (Oral)   Ht 6' (1.829 m)   Wt 259 lb (117.5 kg)   BMI 35.13 kg/m   Wt Readings from Last 3 Encounters:  06/07/18 259 lb (117.5 kg)  03/10/18 270 lb 3.2 oz (122.6 kg)  12/09/17 262 lb (118.8 kg)    Physical Exam  Constitutional: He is oriented to person, place, and time. He appears well-developed and well-nourished. No distress.  HENT:  Right Ear: External ear normal.  Mouth/Throat: Oropharynx is  clear and moist. No oropharyngeal exudate.  Eyes: Conjunctivae are normal. No scleral icterus.  Neck: Neck supple. No thyromegaly present.  Cardiovascular: Normal rate, regular rhythm, normal heart sounds and intact distal pulses.  No murmur heard. Pulmonary/Chest: Effort normal and breath sounds normal. No respiratory distress. He has no wheezes.  Musculoskeletal: Normal range of motion. He exhibits tenderness (Low back pain, consistent with what he had previously.). He exhibits no edema.  Lymphadenopathy:    He has no cervical adenopathy.  Neurological: He is alert and oriented to person, place, and time. Coordination normal.  Skin: Skin is warm and dry. No rash noted. He is not diaphoretic.  Psychiatric: He has a normal mood and affect. His behavior is normal.  Nursing note and vitals reviewed.  Assessment & Plan:   Problem List Items Addressed This Visit      Cardiovascular and Mediastinum   HYPERTENSION, BENIGN - Primary   Relevant Medications   sildenafil (REVATIO) 20 MG tablet   Other Relevant Orders   CMP14+EGFR     Respiratory   COPD (chronic obstructive pulmonary disease) (HCC)   Relevant Medications   fluticasone (FLONASE) 50 MCG/ACT nasal spray     Digestive   GERD (gastroesophageal reflux disease)   Relevant Orders   CBC with Differential/Platelet     Musculoskeletal and Integument   Degenerative disc disease, lumbar   Relevant Medications   carisoprodol (SOMA) 350 MG tablet   oxyCODONE-acetaminophen (PERCOCET) 10-325 MG tablet   oxyCODONE-acetaminophen (PERCOCET) 10-325 MG tablet   oxyCODONE-acetaminophen (PERCOCET) 10-325 MG tablet     Other   S/P partial thyroidectomy   Relevant Orders   TSH   Hypercholesterolemia   Relevant Medications   sildenafil (REVATIO) 20 MG tablet   Other Relevant Orders   Lipid panel    Other Visit Diagnoses    Uncomplicated opioid dependence ([place])       Relevant Medications   carisoprodol (SOMA) 350 MG tablet     oxyCODONE-acetaminophen (PERCOCET) 10-325 MG tablet   oxyCODONE-acetaminophen (PERCOCET) 10-325 MG tablet   oxyCODONE-acetaminophen (PERCOCET) 10-325 MG tablet   Acute non-recurrent frontal sinusitis       Relevant Medications   amoxicillin-clavulanate (AUGMENTIN) 875-125 MG tablet   fluticasone (FLONASE) 50 MCG/ACT nasal spray       Follow up plan: Return in about 3 months (around 09/07/2018), or if symptoms worsen or fail to improve, for Hypertension and cholesterol GERD.  Counseling provided for all of the vaccine components Orders Placed This Encounter  Procedures  . CMP14+EGFR  . CBC with Differential/Platelet  . TSH  . Lipid panel    Caryl Pina, MD [facility] 06/07/2018, 2:38 PM

## 2018-06-18 DIAGNOSIS — D869 Sarcoidosis, unspecified: Secondary | ICD-10-CM | POA: Diagnosis not present

## 2018-07-05 ENCOUNTER — Other Ambulatory Visit: Payer: Self-pay | Admitting: Family Medicine

## 2018-07-05 DIAGNOSIS — J309 Allergic rhinitis, unspecified: Secondary | ICD-10-CM

## 2018-07-06 ENCOUNTER — Other Ambulatory Visit: Payer: Self-pay | Admitting: Family Medicine

## 2018-07-07 NOTE — Telephone Encounter (Signed)
Last seen 06/07/18  Dr Dettinger

## 2018-07-18 DIAGNOSIS — D869 Sarcoidosis, unspecified: Secondary | ICD-10-CM | POA: Diagnosis not present

## 2018-08-06 ENCOUNTER — Other Ambulatory Visit: Payer: Self-pay | Admitting: Family Medicine

## 2018-08-06 DIAGNOSIS — F1120 Opioid dependence, uncomplicated: Secondary | ICD-10-CM

## 2018-08-06 DIAGNOSIS — J439 Emphysema, unspecified: Secondary | ICD-10-CM

## 2018-08-06 DIAGNOSIS — M5136 Other intervertebral disc degeneration, lumbar region: Secondary | ICD-10-CM

## 2018-08-06 NOTE — Telephone Encounter (Signed)
Last seen 06/07/18

## 2018-08-18 DIAGNOSIS — D869 Sarcoidosis, unspecified: Secondary | ICD-10-CM | POA: Diagnosis not present

## 2018-09-03 DIAGNOSIS — H2513 Age-related nuclear cataract, bilateral: Secondary | ICD-10-CM | POA: Diagnosis not present

## 2018-09-08 ENCOUNTER — Ambulatory Visit (INDEPENDENT_AMBULATORY_CARE_PROVIDER_SITE_OTHER): Payer: Medicare HMO | Admitting: Family Medicine

## 2018-09-08 ENCOUNTER — Encounter: Payer: Self-pay | Admitting: Family Medicine

## 2018-09-08 VITALS — BP 133/82 | HR 64 | Temp 97.3°F | Ht 72.0 in | Wt 239.2 lb

## 2018-09-08 DIAGNOSIS — I10 Essential (primary) hypertension: Secondary | ICD-10-CM

## 2018-09-08 DIAGNOSIS — E7800 Pure hypercholesterolemia, unspecified: Secondary | ICD-10-CM | POA: Diagnosis not present

## 2018-09-08 DIAGNOSIS — G5762 Lesion of plantar nerve, left lower limb: Secondary | ICD-10-CM | POA: Diagnosis not present

## 2018-09-08 DIAGNOSIS — J439 Emphysema, unspecified: Secondary | ICD-10-CM

## 2018-09-08 DIAGNOSIS — K219 Gastro-esophageal reflux disease without esophagitis: Secondary | ICD-10-CM

## 2018-09-08 DIAGNOSIS — Z113 Encounter for screening for infections with a predominantly sexual mode of transmission: Secondary | ICD-10-CM | POA: Diagnosis not present

## 2018-09-08 DIAGNOSIS — M51369 Other intervertebral disc degeneration, lumbar region without mention of lumbar back pain or lower extremity pain: Secondary | ICD-10-CM

## 2018-09-08 DIAGNOSIS — J9611 Chronic respiratory failure with hypoxia: Secondary | ICD-10-CM | POA: Diagnosis not present

## 2018-09-08 DIAGNOSIS — D860 Sarcoidosis of lung: Secondary | ICD-10-CM | POA: Diagnosis not present

## 2018-09-08 DIAGNOSIS — M5136 Other intervertebral disc degeneration, lumbar region: Secondary | ICD-10-CM

## 2018-09-08 DIAGNOSIS — F1120 Opioid dependence, uncomplicated: Secondary | ICD-10-CM | POA: Diagnosis not present

## 2018-09-08 MED ORDER — PANTOPRAZOLE SODIUM 40 MG PO TBEC: DELAYED_RELEASE_TABLET | ORAL | 3 refills | Status: DC

## 2018-09-08 MED ORDER — OXYCODONE-ACETAMINOPHEN 10-325 MG PO TABS: 1.0000 | ORAL_TABLET | Freq: Three times a day (TID) | ORAL | 0 refills | Status: DC | PRN

## 2018-09-08 MED ORDER — PREDNISONE 20 MG PO TABS: ORAL_TABLET | ORAL | 0 refills | Status: DC

## 2018-09-08 MED ORDER — METOPROLOL TARTRATE 25 MG PO TABS: 25.0000 mg | ORAL_TABLET | Freq: Two times a day (BID) | ORAL | 3 refills | Status: DC

## 2018-09-08 MED ORDER — CARISOPRODOL 350 MG PO TABS: 350.0000 mg | ORAL_TABLET | Freq: Three times a day (TID) | ORAL | 2 refills | Status: DC | PRN

## 2018-09-08 MED ORDER — SILDENAFIL CITRATE 20 MG PO TABS: 20.0000 mg | ORAL_TABLET | Freq: Every day | ORAL | 2 refills | Status: DC | PRN

## 2018-09-08 NOTE — Addendum Note (Signed)
Addended by: Lorelee Cover C on: 09/08/2018 03:00 PM   Modules accepted: Orders

## 2018-09-08 NOTE — Progress Notes (Signed)
BP 133/82   Pulse 64   Temp (!) 97.3 F (36.3 C) (Oral)   Ht 6' (1.829 m)   Wt 239 lb 3.2 oz (108.5 kg)   BMI 32.44 kg/m    Subjective:    Patient ID: Brian Harrington, male    DOB: September 25, 1946, 73 y.o.   MRN: [id number]  HPI: Brian Harrington is a 72 y.o. male presenting on 09/08/2018 for Hypertension (3 month follow up); Hyperlipidemia; Gastroesophageal Reflux; and Foot Pain (Left- Bottom of foot x 2 months and now it is making it hard for patient to walk.)   HPI COPD and sarcoidosis Patient is coming in for COPD recheck today.  He is currently on Combivent and Incruse and Symbicort, he is also on 2 L nasal cannula, he has been having a lot of issues with breathing just over the past couple weeks with the weather changes.  He does feels like he is not doing as well.  He does have a pulmonologist but feels like there is been some issues getting into his pulmonologist and would like a referral to a different pulmonologist.    GERD Patient is currently on Protonix.  She denies any major symptoms or abdominal pain or belching or burping. She denies any blood in her stool or lightheadedness or dizziness.   Hypertension Patient is currently on metoprolol, and their blood pressure today is 133/82. Patient denies any lightheadedness or dizziness. Patient denies headaches, blurred vision, chest pains, shortness of breath, or weakness. Denies any side effects from medication and is content with current medication.   Left foot pain Patient has pain in the center of the ball of his left foot and feels like he has a knot there that is hard and has been hurting him worse, does not hurt him at rest but only when he standing on it.  He says is been increasing.  Chronic back pain medication refill Patient has chronic lumbar back pain and has had surgeries on it before and is on opiates for this.  He says his pain is been increasing recently with his difficulty breathing and he is wondering if he can get a  few more of the medication to help with him temporarily.  He denies any pain radiating anywhere else and denies any numbness or weakness down either of his legs.  Patient also takes Soma for his back pain and says that it helps him as well.  Relevant past medical, surgical, family and social history reviewed and updated as indicated. Interim medical history since our last visit reviewed. Allergies and medications reviewed and updated.  Review of Systems  Constitutional: Negative for chills and fever.  Eyes: Negative for discharge.  Respiratory: Negative for shortness of breath and wheezing.   Cardiovascular: Negative for chest pain and leg swelling.  Musculoskeletal: Positive for arthralgias, back pain, gait problem and myalgias. Negative for joint swelling.  Skin: Negative for color change and rash.  Neurological: Negative for dizziness, weakness, light-headedness and headaches.  All other systems reviewed and are negative.   Per HPI unless specifically indicated above   Allergies as of 09/08/2018      Reactions   Amlodipine Besylate Shortness Of Breath, Swelling   REACTION: tongue and facial swelling, difficulty breathing   Sulfa Antibiotics Nausea And Vomiting   Sulfonamide Derivatives Nausea And Vomiting      Medication List        Accurate as of 09/08/18 11:42 AM. Always use your most recent med list.  albuterol (2.5 MG/3ML) 0.083% nebulizer solution Commonly known as:  PROVENTIL NEBULIZE 1 VIAL EVERY 6 HOURS AS NEEDED   carisoprodol 350 MG tablet Commonly known as:  SOMA TAKE ONE TABLET BY MOUTH THREE TIMES DAILY AS NEEDED FOR MUSCLE SPASM   COMBIVENT RESPIMAT 20-100 MCG/ACT Aers respimat Generic drug:  Ipratropium-Albuterol USE 2 PUFFS 4 TIMES DAILY AS NEEDED   fluticasone 50 MCG/ACT nasal spray Commonly known as:  FLONASE Place 1 spray into both nostrils 2 (two) times daily as needed for allergies or rhinitis.   INCRUSE ELLIPTA 62.5 MCG/INH  Aepb Generic drug:  umeclidinium bromide USE 1 INHALATION DAILY   loratadine 10 MG tablet Commonly known as:  CLARITIN TAKE ONE (1) TABLET EACH DAY   metoprolol tartrate 25 MG tablet Commonly known as:  LOPRESSOR TAKE ONE TABLET BY MOUTH TWICE DAILY   mupirocin ointment 2 % Commonly known as:  BACTROBAN Place 1 application into the nose. FOR 5 DAYS   ondansetron 4 MG tablet Commonly known as:  ZOFRAN Take 1 tablet (4 mg total) by mouth every 6 (six) hours as needed for nausea.   oxyCODONE-acetaminophen 10-325 MG tablet Commonly known as:  PERCOCET Take 1 tablet by mouth every 8 (eight) hours as needed for pain.   oxyCODONE-acetaminophen 10-325 MG tablet Commonly known as:  PERCOCET Take 1 tablet by mouth every 8 (eight) hours as needed for pain. Please don't refill until 30 days from prescription date   OXYGEN Inhale 2 L into the lungs continuous. 2 liters as needed   pantoprazole 40 MG tablet Commonly known as:  PROTONIX TAKE ONE (1) TABLET EACH DAY   polyethylene glycol packet Commonly known as:  MIRALAX / GLYCOLAX Take 17 g by mouth 2 (two) times daily.   sildenafil 20 MG tablet Commonly known as:  REVATIO TAKE ONE TABLET BY MOUTH DAILY AS NEEDED   sodium chloride 0.65 % Soln nasal spray Commonly known as:  OCEAN Place 1 spray into both nostrils as needed for congestion.   SYMBICORT 160-4.5 MCG/ACT inhaler Generic drug:  budesonide-formoterol USE 2 PUFFS TWICE DAILY          Objective:    BP 133/82   Pulse 64   Temp (!) 97.3 F (36.3 C) (Oral)   Ht 6' (1.829 m)   Wt 239 lb 3.2 oz (108.5 kg)   BMI 32.44 kg/m   Wt Readings from Last 3 Encounters:  09/08/18 239 lb 3.2 oz (108.5 kg)  06/07/18 259 lb (117.5 kg)  03/10/18 270 lb 3.2 oz (122.6 kg)    Physical Exam  Constitutional: He is oriented to person, place, and time. He appears well-developed and well-nourished. No distress.  Eyes: Conjunctivae are normal. No scleral icterus.  Neck: Neck  supple. No thyromegaly present.  Cardiovascular: Normal rate, regular rhythm, normal heart sounds and intact distal pulses.  No murmur heard. Pulmonary/Chest: Effort normal and breath sounds normal. No respiratory distress. He has no wheezes.  Musculoskeletal: Normal range of motion. He exhibits tenderness (Pain and small knot in the center of the bottom of his left foot, consistent with possible fibroma or neuroma). He exhibits no edema.  Lymphadenopathy:    He has no cervical adenopathy.  Neurological: He is alert and oriented to person, place, and time. Coordination normal.  Skin: Skin is warm and dry. No rash noted. He is not diaphoretic.  Nursing note and vitals reviewed.     Assessment & Plan:   Problem List Items Addressed This Visit  Cardiovascular and Mediastinum   HYPERTENSION, BENIGN   Relevant Medications   metoprolol tartrate (LOPRESSOR) 25 MG tablet   sildenafil (REVATIO) 20 MG tablet   Other Relevant Orders   CMP14+EGFR     Respiratory   COPD (chronic obstructive pulmonary disease) ([place]) - Primary   Relevant Medications   predniSONE (DELTASONE) 20 MG tablet   Sarcoidosis of lung ([place])   Relevant Orders   Ambulatory referral to Pulmonology   Chronic respiratory failure with hypoxia ([place])   Relevant Orders   Ambulatory referral to Pulmonology     Digestive   GERD (gastroesophageal reflux disease)   Relevant Medications   pantoprazole (PROTONIX) 40 MG tablet   Other Relevant Orders   CBC with Differential/Platelet     Musculoskeletal and Integument   Degenerative disc disease, lumbar   Relevant Medications   carisoprodol (SOMA) 350 MG tablet   oxyCODONE-acetaminophen (PERCOCET) 10-325 MG tablet   oxyCODONE-acetaminophen (PERCOCET) 10-325 MG tablet   oxyCODONE-acetaminophen (PERCOCET) 10-325 MG tablet   predniSONE (DELTASONE) 20 MG tablet     Other   Hypercholesterolemia   Relevant Medications   metoprolol tartrate (LOPRESSOR) 25 MG tablet    sildenafil (REVATIO) 20 MG tablet   Other Relevant Orders   Lipid panel    Other Visit Diagnoses    Uncomplicated opioid dependence ([place])       Relevant Medications   carisoprodol (SOMA) 350 MG tablet   oxyCODONE-acetaminophen (PERCOCET) 10-325 MG tablet   oxyCODONE-acetaminophen (PERCOCET) 10-325 MG tablet   Morton's neuroma of left foot       Relevant Medications   carisoprodol (SOMA) 350 MG tablet   Other Relevant Orders   Ambulatory referral to Podiatry    Patient currently has an oxygen tank that he carries around but is too hard to care and he would like an oxygen compressor with the battery.  He does try and stay out and does need oxygen consistently.  Pulsed oxygen dosing would be sufficient.  Patient's oxygen dipped down to 92% ambulating on room air.  96% with 2 L nasal cannula.  Oxygen is symptomatically because of lung disease  Follow up plan: Return in about 3 months (around 12/08/2018), or if symptoms worsen or fail to improve, for Recheck chronic pain and COPD.  Counseling provided for all of the vaccine components No orders of the defined types were placed in this encounter.   Caryl Pina, MD [facility] 09/08/2018, 11:42 AM

## 2018-09-09 ENCOUNTER — Telehealth: Payer: Self-pay | Admitting: Family Medicine

## 2018-09-09 LAB — CBC WITH DIFFERENTIAL/PLATELET
BASOS: 1 %
Basophils Absolute: 0 10*3/uL (ref 0.0–0.2)
EOS (ABSOLUTE): 0.4 10*3/uL (ref 0.0–0.4)
Eos: 6 %
Hematocrit: 46.7 % (ref 37.5–51.0)
Hemoglobin: 16.3 g/dL (ref 13.0–17.7)
IMMATURE GRANULOCYTES: 0 %
Immature Grans (Abs): 0 10*3/uL (ref 0.0–0.1)
Lymphocytes Absolute: 1.4 10*3/uL (ref 0.7–3.1)
Lymphs: 24 %
MCH: 28.7 pg (ref 26.6–33.0)
MCHC: 34.9 g/dL (ref 31.5–35.7)
MCV: 82 fL (ref 79–97)
MONOS ABS: 0.7 10*3/uL (ref 0.1–0.9)
Monocytes: 12 %
NEUTROS PCT: 57 %
Neutrophils Absolute: 3.5 10*3/uL (ref 1.4–7.0)
Platelets: 225 10*3/uL (ref 150–450)
RBC: 5.67 x10E6/uL (ref 4.14–5.80)
RDW: 13.9 % (ref 12.3–15.4)
WBC: 6 10*3/uL (ref 3.4–10.8)

## 2018-09-09 LAB — LIPID PANEL
CHOL/HDL RATIO: 4.5 ratio (ref 0.0–5.0)
Cholesterol, Total: 149 mg/dL (ref 100–199)
HDL: 33 mg/dL — ABNORMAL LOW (ref >39)
LDL CALC: 95 mg/dL (ref 0–99)
Triglycerides: 104 mg/dL (ref 0–149)
VLDL Cholesterol Cal: 21 mg/dL (ref 5–40)

## 2018-09-09 LAB — CMP14+EGFR
A/G RATIO: 1.7 (ref 1.2–2.2)
ALBUMIN: 4.4 g/dL (ref 3.6–4.8)
ALT: 16 IU/L (ref 0–44)
AST: 16 IU/L (ref 0–40)
Alkaline Phosphatase: 86 IU/L (ref 39–117)
BUN / CREAT RATIO: 14 (ref 10–24)
BUN: 15 mg/dL (ref 8–27)
Bilirubin Total: 0.5 mg/dL (ref 0.0–1.2)
CALCIUM: 9.2 mg/dL (ref 8.6–10.2)
CO2: 25 mmol/L (ref 20–29)
CREATININE: 1.11 mg/dL (ref 0.76–1.27)
Chloride: 101 mmol/L (ref 96–106)
GFR, EST AFRICAN AMERICAN: 78 mL/min/{1.73_m2} (ref >59)
GFR, EST NON AFRICAN AMERICAN: 67 mL/min/{1.73_m2} (ref >59)
GLOBULIN, TOTAL: 2.6 g/dL (ref 1.5–4.5)
Glucose: 104 mg/dL — ABNORMAL HIGH (ref 65–99)
POTASSIUM: 4.3 mmol/L (ref 3.5–5.2)
SODIUM: 141 mmol/L (ref 134–144)
TOTAL PROTEIN: 7 g/dL (ref 6.0–8.5)

## 2018-09-09 LAB — GC/CHLAMYDIA PROBE AMP
Chlamydia trachomatis, NAA: NEGATIVE
Neisseria gonorrhoeae by PCR: NEGATIVE

## 2018-09-09 MED ORDER — NALOXONE HCL 0.4 MG/ML IJ SOLN: 0.4000 mg | INTRAMUSCULAR | 5 refills | Status: DC | PRN

## 2018-09-09 NOTE — Telephone Encounter (Signed)
Please review and advise.

## 2018-09-09 NOTE — Telephone Encounter (Signed)
Yes I forgot to send the Narcan, I just sent it for now.

## 2018-09-10 ENCOUNTER — Other Ambulatory Visit: Payer: Self-pay | Admitting: Family Medicine

## 2018-09-11 LAB — RPR+HSVIGM+HBSAG+HSV2(IGG)+...
HEP B S AG: NEGATIVE
HIV Screen 4th Generation wRfx: NONREACTIVE
HSV 2 IGG, TYPE SPEC: 3.01 {index} — AB (ref 0.00–0.90)
HSVI/II Comb IgM: 1.02 Ratio — ABNORMAL HIGH (ref 0.00–0.90)
RPR: NONREACTIVE

## 2018-09-11 LAB — HSV-2 IGG SUPPLEMENTAL TEST: HSV-2 IgG Supplemental Test: POSITIVE — AB

## 2018-09-13 ENCOUNTER — Other Ambulatory Visit: Payer: Self-pay | Admitting: Family Medicine

## 2018-09-13 ENCOUNTER — Telehealth: Payer: Self-pay | Admitting: Family Medicine

## 2018-09-13 MED ORDER — NALOXONE HCL 4 MG/0.1ML NA LIQD: 1.0000 | Freq: Once | NASAL | 1 refills | Status: AC

## 2018-09-13 NOTE — Telephone Encounter (Signed)
Tell him sorry for the confusion, I did send it for him.

## 2018-09-14 NOTE — Telephone Encounter (Signed)
Last seen 09/08/18

## 2018-09-18 DIAGNOSIS — D869 Sarcoidosis, unspecified: Secondary | ICD-10-CM | POA: Diagnosis not present

## 2018-09-28 ENCOUNTER — Institutional Professional Consult (permissible substitution): Payer: Medicare HMO | Admitting: Internal Medicine

## 2018-10-01 ENCOUNTER — Encounter: Payer: Self-pay | Admitting: Internal Medicine

## 2018-10-01 ENCOUNTER — Ambulatory Visit (INDEPENDENT_AMBULATORY_CARE_PROVIDER_SITE_OTHER): Payer: Medicare HMO | Admitting: Internal Medicine

## 2018-10-01 ENCOUNTER — Ambulatory Visit (INDEPENDENT_AMBULATORY_CARE_PROVIDER_SITE_OTHER)
Admission: RE | Admit: 2018-10-01 | Discharge: 2018-10-01 | Disposition: A | Discharge status: Home / Self Care | Payer: Medicare HMO | Source: Ambulatory Visit | Attending: Internal Medicine | Admitting: Internal Medicine

## 2018-10-01 ENCOUNTER — Other Ambulatory Visit (INDEPENDENT_AMBULATORY_CARE_PROVIDER_SITE_OTHER): Payer: Medicare HMO

## 2018-10-01 VITALS — BP 118/60 | HR 80 | Ht 72.0 in | Wt 242.0 lb

## 2018-10-01 DIAGNOSIS — D860 Sarcoidosis of lung: Secondary | ICD-10-CM | POA: Diagnosis not present

## 2018-10-01 DIAGNOSIS — J449 Chronic obstructive pulmonary disease, unspecified: Secondary | ICD-10-CM

## 2018-10-01 DIAGNOSIS — J9611 Chronic respiratory failure with hypoxia: Secondary | ICD-10-CM

## 2018-10-01 DIAGNOSIS — R058 Other specified cough: Secondary | ICD-10-CM

## 2018-10-01 DIAGNOSIS — R05 Cough: Secondary | ICD-10-CM

## 2018-10-01 LAB — CBC WITH DIFFERENTIAL/PLATELET
BASOS PCT: 0.7 % (ref 0.0–3.0)
Basophils Absolute: 0.1 10*3/uL (ref 0.0–0.1)
EOS ABS: 0.4 10*3/uL (ref 0.0–0.7)
EOS PCT: 4.4 % (ref 0.0–5.0)
HCT: 39.9 % (ref 39.0–52.0)
Hemoglobin: 13 g/dL (ref 13.0–17.0)
LYMPHS ABS: 1.4 10*3/uL (ref 0.7–4.0)
Lymphocytes Relative: 15 % (ref 12.0–46.0)
MCHC: 32.5 g/dL (ref 30.0–36.0)
MCV: 83.1 fl (ref 78.0–100.0)
Monocytes Absolute: 0.6 10*3/uL (ref 0.1–1.0)
Monocytes Relative: 6.9 % (ref 3.0–12.0)
NEUTROS PCT: 73 % (ref 43.0–77.0)
Neutro Abs: 6.6 10*3/uL (ref 1.4–7.7)
PLATELETS: 261 10*3/uL (ref 150.0–400.0)
RBC: 4.8 Mil/uL (ref 4.22–5.81)
RDW: 14 % (ref 11.5–15.5)
WBC: 9 10*3/uL (ref 4.0–10.5)

## 2018-10-01 LAB — SEDIMENTATION RATE: SED RATE: 69 mm/h — AB (ref 0–20)

## 2018-10-01 MED ORDER — BUDESONIDE-FORMOTEROL FUMARATE 80-4.5 MCG/ACT IN AERO: 2.0000 | INHALATION_SPRAY | Freq: Two times a day (BID) | RESPIRATORY_TRACT | 11 refills | Status: DC

## 2018-10-01 MED ORDER — BUDESONIDE-FORMOTEROL FUMARATE 80-4.5 MCG/ACT IN AERO: 2.0000 | INHALATION_SPRAY | Freq: Two times a day (BID) | RESPIRATORY_TRACT | 0 refills | Status: DC

## 2018-10-01 MED ORDER — AMOXICILLIN-POT CLAVULANATE 875-125 MG PO TABS: 1.0000 | ORAL_TABLET | Freq: Two times a day (BID) | ORAL | 0 refills | Status: AC

## 2018-10-01 MED ORDER — FAMOTIDINE 20 MG PO TABS: ORAL_TABLET | ORAL | 11 refills | Status: DC

## 2018-10-01 NOTE — Progress Notes (Signed)
Brian Harrington, male    DOB: 01-Mar-1946,    MRN: [id number]    Brief patient profile:  25 yowm quit smoking 1992  With some doe at that point  improved but since  around 2015 gradually worse sob/cough > [place] [facility] dx copd rx saba plus another inhaler "something else"  helped some then placed on 02 around 2016 at 2lpm  referred to pulmonary clinic 10/01/2018 by Dr   Dettinger for refractory cough / sob  / sarcoid but no evidence of copd at initial eval    10/01/2018 Pulmonary / new pt eval / Brian Harrington   Re copd / thoroughly confused with meds  Chief Complaint  Patient presents with  . Pulmonary Consult    Referred by Dr. 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 Pt c/o cough "since the weather got hot"- at times he produces large amounts of yellow sputum.    Dyspnea:  avg day can do 2 aisles at [employer] on 02  Cough: more than usual worse p arising /worse p incruse and hot weather > mostly beige color x sev tbsp per day Sleep: flat bed 30 degrees hob using pillows  SABA use: combivent/neb variably better p use, not really using neb often "just with colds' Taking symbicort 160 x 2  First thing in am then Incruse lunchtime  Some better with pred in past and just got another rx but hasn't started    No obvious day to day or daytime variability or assoc  mucus plugs or hemoptysis or cp or chest tightness, subjective wheeze or overt sinus or hb symptoms.   Sleeping as above  without nocturnal   exacerbation  of respiratory  c/o's or need for noct saba. Also denies any obvious fluctuation of symptoms with weather or environmental changes or other aggravating or alleviating factors except as outlined above   No unusual exposure hx or h/o childhood pna/ asthma or knowledge of premature birth.  Current Allergies, Complete Past Medical History, Past Surgical History, Family History, and Social History were reviewed in [employer] record.  ROS  The following are not active complaints unless  bolded Hoarseness, sore throat, dysphagia, dental problems, itching, sneezing,  nasal congestion or discharge of excess mucus or purulent secretions, ear ache,   fever, chills, sweats, unintended wt loss or wt gain, classically pleuritic or exertional cp,  orthopnea pnd or arm/hand swelling  or leg swelling, presyncope, palpitations, abdominal pain, anorexia, nausea, vomiting, diarrhea  or change in bowel habits or change in bladder habits, change in stools or change in urine, dysuria, hematuria,  rash, arthralgias, visual complaints, headache, numbness, weakness or ataxia or problems with walking or coordination,  change in mood or  memory.           Past Medical History:  Diagnosis Date  . Arthritis   . Back pain, chronic   . CAD (coronary artery disease)   . COPD (chronic obstructive pulmonary disease) (HCC)    on 2L home O2  . Hypertension   . Sarcoidosis of lung ([place])    reported by patient but no clear documentation of this condition  . Sleep apnea    no CPAP    Outpatient Medications Prior to Visit  Medication Sig Dispense Refill  . albuterol (PROVENTIL) (2.5 MG/3ML) 0.083% nebulizer solution NEBULIZE 1 VIAL EVERY 6 HOURS AS NEEDED 750 mL 0  . carisoprodol (SOMA) 350 MG tablet Take 1 tablet (350 mg total) by mouth 3 (three) times daily as  needed. for muscle spams 90 tablet 2  . COMBIVENT RESPIMAT 20-100 MCG/ACT AERS respimat USE 2 PUFFS 4 TIMES DAILY AS NEEDED 12 g 0  . fluticasone (FLONASE) 50 MCG/ACT nasal spray Place 1 spray into both nostrils 2 (two) times daily as needed for allergies or rhinitis. 16 g 6  . INCRUSE ELLIPTA 62.5 MCG/INH AEPB USE 1 INHALATION DAILY 90 each 0  . loratadine (CLARITIN) 10 MG tablet TAKE ONE (1) TABLET EACH DAY 90 tablet 1  . metoprolol tartrate (LOPRESSOR) 25 MG tablet Take 1 tablet (25 mg total) by mouth 2 (two) times daily. 180 tablet 3  . mupirocin ointment (BACTROBAN) 2 % Place 1 application into the nose. FOR 5 DAYS    . NARCAN 4 MG/0.1ML  LIQD nasal spray kit USE 1 INHALATION NASALY AS NEEDED 2 kit 0  . ondansetron (ZOFRAN) 4 MG tablet Take 1 tablet (4 mg total) by mouth every 6 (six) hours as needed for nausea. 20 tablet 0  . oxyCODONE-acetaminophen (PERCOCET) 10-325 MG tablet Take 1 tablet by mouth every 8 (eight) hours as needed for pain. 80 tablet 0  . OXYGEN Inhale 2 L into the lungs continuous. 2 liters as needed     . pantoprazole (PROTONIX) 40 MG tablet TAKE ONE (1) TABLET EACH DAY 90 tablet 3  . polyethylene glycol (MIRALAX / GLYCOLAX) packet Take 17 g by mouth 2 (two) times daily. 30 each 0  . sildenafil (REVATIO) 20 MG tablet Take 1 tablet (20 mg total) by mouth daily as needed. 10 tablet 2  . sodium chloride (OCEAN) 0.65 % SOLN nasal spray Place 1 spray into both nostrils as needed for congestion. 1 Bottle 0  . SYMBICORT 160-4.5 MCG/ACT inhaler USE 2 PUFFS TWICE DAILY 30.6 g 0  . oxyCODONE-acetaminophen (PERCOCET) 10-325 MG tablet Take 1 tablet by mouth every 8 (eight) hours as needed for pain. Please don't refill until 30 days from prescription date 80 tablet 0  . oxyCODONE-acetaminophen (PERCOCET) 10-325 MG tablet Take 1 tablet by mouth every 8 (eight) hours as needed for pain. Do not refill until 60 days from prescription date 80 tablet 0  . predniSONE (DELTASONE) 20 MG tablet 2 po at same time daily for 5 days 10 tablet 0             Objective:     BP 118/60 (BP Location: Left Arm, Cuff Size: Normal)   Pulse 80   Ht 6' (1.829 m)   Wt 242 lb (109.8 kg)   SpO2 99%   BMI 32.82 kg/m   SpO2: 99 % RA  amb hoarse Can't breath in s coughing fit    HEENT: Full dentures/ nl turbinates bilaterally, and oropharynx. Nl external ear canals without cough reflex   Modified Mallampati Score =   1   NECK :  without JVD/Nodes/TM/ nl carotid upstrokes bilaterally   LUNGS: no acc muscle use,  Nl contour chest which is clear to A and P bilaterally with cough on insp  maneuvers   CV:  RRR  no s3 or murmur or  increase in P2, and no edema   ABD:  soft and nontender with nl inspiratory excursion in the supine position. No bruits or organomegaly appreciated, bowel sounds nl  MS:  Nl gait/ ext warm without deformities, calf tenderness, cyanosis or clubbing No obvious joint restrictions   SKIN: warm and dry without lesions    NEURO:  alert, approp, nl sensorium with  no motor or cerebellar deficits apparent.  CXR PA and Lateral:   10/01/2018 :    I personally reviewed images and agree with radiology impression as follows:   The heart size and mediastinal contours are stable without evidence of adenopathy. The previously demonstrated diffuse interstitial prominence has resolved. The lungs are now clear   Labs ordered 10/01/2018  Allergy profile , alpha one screen     Lab Results  Component Value Date   ESRSEDRATE 69 (H) 10/01/2018          Assessment   COPD  GOLD  0  Quit smoking 1992  Spirometry 10/01/2018  FEV1 1.9 (52%)  Ratio 83 s curvature off all rx   - 10/01/2018  After extensive coaching inhaler device,  effectiveness =    75% with hfa > reduced symbicort to 80 2bid as cough is the main concern    When respiratory symptoms begin or become refractory well after a patient reports complete smoking cessation,  Especially when this wasn't the case while they were smoking, a red flag is raised based on the work of Dr Kris Mouton which states:  if you quit smoking when your best day FEV1 is still  l preserved it is highly unlikely you will progress to severe disease.  That is to say, once the smoking stops,  the symptoms should not suddenly erupt or markedly worsen.  If so, the differential diagnosis should include  obesity/deconditioning,  LPR/Reflux/Aspiration syndromes,  occult CHF, or  especially side effect of medications like DPI meds  commonly used in this population.     For now will drop back to symb 80 2bid and stop incruse and f/u in 4 weeks with a full set of pfst       Chronic respiratory failure with hypoxia ([place]) 10/01/2018   Walked RA x one lap @ 185 stopped due to  Sob no desats slow pace   No evidence of 02 needs at this point  Upper airway cough syndrome Allergy profile 10/01/2018 >  Eos 0.4/  IgE  Pending   - max rx for gerd 10/01/2018    Upper airway cough syndrome (previously labeled PNDS),  is so named because it's frequently impossible to sort out how much is  CR/sinusitis with freq throat clearing (which can be related to primary GERD)   vs  causing  secondary (" extra esophageal")  GERD from wide swings in gastric pressure that occur with throat clearing, often  promoting self use of mint and menthol lozenges that reduce the lower esophageal sphincter tone and exacerbate the problem further in a cyclical fashion.   These are the same pts (now being labeled as having "irritable larynx syndrome" by some cough centers) who not infrequently have a history of having failed to tolerate ace inhibitors,  dry powder inhalers like incruse or biphosphonates or report having atypical/extraesophageal reflux symptoms that don't respond to standard doses of PPI  and are easily confused as having aecopd or asthma flares by even experienced allergists/ pulmonologists (myself included).   >>> REC: Take augmentin x 10 days and consider sinus CT if cough not better Max rx for gerd given does not tol ppi well ? Needs to change to aciphex ?  pred x 5 days as planned Low dose ics only here and only in hfa form/ d/c all dpi  F/u with full pfts and return with all meds in hand using a trust but verify approach to confirm accurate Medication  Reconciliation The principal here is that until we are certain  that the  patients are doing what we've asked, it makes no sense to ask them to do more.      Sarcoidosis of lung ([place]) A good rule of thumb is that >95% of pts with active sarcoid in any organ will have some plain cxr changes - on the other hand  if there are  active pulmonary symptoms the cxr will look much worse than the patient:  No evidence of either scenario here/ strongly doubt active dz and see no reason for now to pursue this dx further other than unexplained elevation of esr     Total time devoted to counseling  > 50 % of initial 60 min office visit:  review case with pt/ discussion of options/alternatives/ personally creating written customized instructions  in presence of pt  then going over those specific  Instructions directly with the pt including how to use all of the meds but in particular covering each new medication in detail and the difference between the maintenance= "automatic" meds and the prns using an action plan format for the latter (If this problem/symptom => do that organization reading Left to right).  Please see AVS from this visit for a full list of these instructions which I personally wrote for this pt and  are unique to this visit.  See device teaching which extended face to face time for this visit      Christinia Gully, MD 10/01/2018

## 2018-10-01 NOTE — Patient Instructions (Addendum)
Plan A = Automatic = Symbicort 80 Take 2 puffs first thing in am and then another 2 puffs about 12 hours later.  Stop Incruse    Plan B = Backup Only use your combivent  as a rescue medication to be used if you can't catch your breath by resting or doing a relaxed purse lip breathing pattern.  - The less you use it, the better it will work when you need it. - Ok to use the inhaler up to 1 puffs  every 4 hours if you must but call for appointment if use goes up over your usual need - Don't leave home without it !!  (think of it like the spare tire for your car)    Plan C = Crisis - only use your albuterol nebulizer if you first try Plan B and it fails to help > ok to use the nebulizer up to every 4 hours but if start needing it regularly call for immediate appointment   Augmentin 875 mg take one pill twice daily  X 10 days - take at breakfast and supper with large glass of water.  It would help reduce the usual side effects (diarrhea and yeast infections) if you ate cultured yogurt at lunch.    Prednisone take it per Dr D  Pepcid 20 mg after breakfast and continue protonix before supper   GERD (REFLUX)  is an extremely common cause of respiratory symptoms just like yours , many times with no obvious heartburn at all.     It can be treated with medication, but also with lifestyle changes including elevation of the head of your bed (ideally with 6 inch  bed blocks),  Smoking cessation, avoidance of late meals, excessive alcohol, and avoid fatty foods, chocolate, peppermint, colas, red wine, and acidic juices such as orange juice.  NO MINT OR MENTHOL PRODUCTS SO NO COUGH DROPS   USE SUGARLESS CANDY INSTEAD (Jolley ranchers or Stover's or Life Savers) or even ice chips will also do - the key is to swallow to prevent all throat clearing. NO OIL BASED VITAMINS - use powdered substitutes.   Please remember to go to the lab and x-ray department downstairs in the basement  for your tests - we  will call you with the results when they are available.     Please schedule a follow up office visit in 4 weeks, call sooner if needed with all medications /inhalers/ solutions in hand so we can verify exactly what you are taking. This includes all medications from all doctors and over the counters - PLEASE separate them into two bags:  the ones you take automatically, no matter what, vs the ones you take just when you feel you need them "BAG #2 is UP TO YOU"  - this will really help [place] help you take your medications more effectively.

## 2018-10-02 ENCOUNTER — Encounter: Payer: Self-pay | Admitting: Internal Medicine

## 2018-10-02 DIAGNOSIS — R058 Other specified cough: Secondary | ICD-10-CM | POA: Insufficient documentation

## 2018-10-02 DIAGNOSIS — R05 Cough: Secondary | ICD-10-CM | POA: Insufficient documentation

## 2018-10-02 NOTE — Assessment & Plan Note (Signed)
A good rule of thumb is that >95% of pts with active sarcoid in any organ will have some plain cxr changes - on the other hand  if there are active pulmonary symptoms the cxr will look much worse than the patient:  No evidence of either scenario here/ strongly doubt active dz and see no reason for now to pursue this dx further other than unexplained elevation of esr     Total time devoted to counseling  > 50 % of initial 60 min office visit:  review case with pt/ discussion of options/alternatives/ personally creating written customized instructions  in presence of pt  then going over those specific  Instructions directly with the pt including how to use all of the meds but in particular covering each new medication in detail and the difference between the maintenance= "automatic" meds and the prns using an action plan format for the latter (If this problem/symptom => do that organization reading Left to right).  Please see AVS from this visit for a full list of these instructions which I personally wrote for this pt and  are unique to this visit.  See device teaching which extended face to face time for this visit

## 2018-10-02 NOTE — Assessment & Plan Note (Signed)
10/01/2018   Walked RA x one lap @ 185 stopped due to  Sob no desats slow pace   No evidence of 02 needs at this point

## 2018-10-02 NOTE — Assessment & Plan Note (Addendum)
Allergy profile 10/01/2018 >  Eos 0.4/  IgE  Pending   - max rx for gerd 10/01/2018    Upper airway cough syndrome (previously labeled PNDS),  is so named because it's frequently impossible to sort out how much is  CR/sinusitis with freq throat clearing (which can be related to primary GERD)   vs  causing  secondary (" extra esophageal")  GERD from wide swings in gastric pressure that occur with throat clearing, often  promoting self use of mint and menthol lozenges that reduce the lower esophageal sphincter tone and exacerbate the problem further in a cyclical fashion.   These are the same pts (now being labeled as having "irritable larynx syndrome" by some cough centers) who not infrequently have a history of having failed to tolerate ace inhibitors,  dry powder inhalers like incruse or biphosphonates or report having atypical/extraesophageal reflux symptoms that don't respond to standard doses of PPI  and are easily confused as having aecopd or asthma flares by even experienced allergists/ pulmonologists (myself included).   >>> REC: Take augmentin x 10 days and consider sinus CT if cough not better Max rx for gerd given does not tol ppi well ? Needs to change to aciphex ?  pred x 5 days as planned Low dose ics only here and only in hfa form/ d/c all dpi  F/u with full pfts and return with all meds in hand using a trust but verify approach to confirm accurate Medication  Reconciliation The principal here is that until we are certain that the  patients are doing what we've asked, it makes no sense to ask them to do more.

## 2018-10-02 NOTE — Assessment & Plan Note (Addendum)
Quit smoking 1992  Spirometry 10/01/2018  FEV1 1.9 (52%)  Ratio 83 s curvature off all rx   - 10/01/2018  After extensive coaching inhaler device,  effectiveness =    75% with hfa > reduced symbicort to 80 2bid as cough is the main concern    When respiratory symptoms begin or become refractory well after a patient reports complete smoking cessation,  Especially when this wasn't the case while they were smoking, a red flag is raised based on the work of Dr Primitivo Gauze which states:  if you quit smoking when your best day FEV1 is still  l preserved it is highly unlikely you will progress to severe disease.  That is to say, once the smoking stops,  the symptoms should not suddenly erupt or markedly worsen.  If so, the differential diagnosis should include  obesity/deconditioning,  LPR/Reflux/Aspiration syndromes,  occult CHF, or  especially side effect of medications like DPI meds  commonly used in this population.     For now will drop back to symb 80 2bid and stop incruse and f/u in 4 weeks with a full set of pfst

## 2018-10-04 NOTE — Progress Notes (Signed)
Spoke with pt and notified of results per Dr. Wert. Pt verbalized understanding and denied any questions. 

## 2018-10-07 LAB — RESPIRATORY ALLERGY PROFILE REGION II ~~LOC~~
Allergen, A. alternata, m6: <0.1 kU/L
Allergen, Comm Silver Birch, t9: <0.1 kU/L
Allergen, Mouse Urine Protein, e78: <0.1 kU/L
Allergen, P. notatum, m1: <0.1 kU/L
Box Elder IgE: <0.1 kU/L
CLADOSPORIUM HERBARUM (M2) IGE: <0.1 kU/L
CLASS: 0
CLASS: 0
CLASS: 0
CLASS: 0
CLASS: 0
CLASS: 0
CLASS: 0
CLASS: 0
CLASS: 0
CLASS: 0
CLASS: 0
COMMON RAGWEED (SHORT) (W1) IGE: <0.1 kU/L
Class: 0
Class: 0
Class: 0
Class: 0
Class: 0
Class: 0
Class: 0
Class: 0
Class: 0
Class: 0
Class: 0
Class: 0
Class: 1
D. farinae: 0.54 kU/L — ABNORMAL HIGH
Elm IgE: <0.1 kU/L
IgE (Immunoglobulin E), Serum: 182 kU/L — ABNORMAL HIGH (ref <=114)
Johnson Grass: <0.1 kU/L
Pecan/Hickory Tree IgE: <0.1 kU/L
Sheep Sorrel IgE: <0.1 kU/L
Timothy Grass: <0.1 kU/L

## 2018-10-07 LAB — INTERPRETATION:

## 2018-10-07 LAB — ALPHA-1 ANTITRYPSIN PHENOTYPE: A-1 Antitrypsin, Ser: 185 mg/dL (ref 83–199)

## 2018-10-18 DIAGNOSIS — D869 Sarcoidosis, unspecified: Secondary | ICD-10-CM | POA: Diagnosis not present

## 2018-10-19 ENCOUNTER — Telehealth: Payer: Self-pay | Admitting: Family Medicine

## 2018-10-19 NOTE — Telephone Encounter (Signed)
Spoke to patient and he states he was having leg cramps this morning but it has went away. States he will call us if it happens again.

## 2018-10-29 ENCOUNTER — Ambulatory Visit: Payer: Medicare HMO | Admitting: Internal Medicine

## 2018-11-02 ENCOUNTER — Encounter: Payer: Self-pay | Admitting: Internal Medicine

## 2018-11-02 ENCOUNTER — Ambulatory Visit (INDEPENDENT_AMBULATORY_CARE_PROVIDER_SITE_OTHER): Payer: Medicare HMO | Admitting: Internal Medicine

## 2018-11-02 ENCOUNTER — Ambulatory Visit (INDEPENDENT_AMBULATORY_CARE_PROVIDER_SITE_OTHER): Payer: 59 | Admitting: Internal Medicine

## 2018-11-02 VITALS — BP 128/74 | HR 65 | Ht 69.0 in | Wt 245.0 lb

## 2018-11-02 DIAGNOSIS — Z23 Encounter for immunization: Secondary | ICD-10-CM

## 2018-11-02 DIAGNOSIS — R058 Other specified cough: Secondary | ICD-10-CM

## 2018-11-02 DIAGNOSIS — R0609 Other forms of dyspnea: Secondary | ICD-10-CM | POA: Diagnosis not present

## 2018-11-02 DIAGNOSIS — J449 Chronic obstructive pulmonary disease, unspecified: Secondary | ICD-10-CM

## 2018-11-02 DIAGNOSIS — R05 Cough: Secondary | ICD-10-CM

## 2018-11-02 LAB — PULMONARY FUNCTION TEST
DL/VA % pred: 89 %
DL/VA: 4.07 ml/min/mmHg/L
DLCO COR % PRED: 70 %
DLCO UNC: 20.88 ml/min/mmHg
DLCO cor: 21.94 ml/min/mmHg
DLCO unc % pred: 67 %
FEF 25-75 POST: 2.32 L/s
FEF 25-75 Pre: 1.81 L/sec
FEF2575-%CHANGE-POST: 28 %
FEF2575-%PRED-POST: 95 %
FEF2575-%PRED-PRE: 74 %
FEV1-%Change-Post: 4 %
FEV1-%PRED-POST: 77 %
FEV1-%PRED-PRE: 74 %
FEV1-POST: 2.46 L
FEV1-Pre: 2.34 L
FEV1FVC-%CHANGE-POST: 4 %
FEV1FVC-%Pred-Pre: 101 %
FEV6-%CHANGE-POST: 0 %
FEV6-%PRED-PRE: 76 %
FEV6-%Pred-Post: 77 %
FEV6-Post: 3.14 L
FEV6-Pre: 3.12 L
FEV6FVC-%Pred-Post: 105 %
FEV6FVC-%Pred-Pre: 105 %
FVC-%CHANGE-POST: 0 %
FVC-%PRED-POST: 73 %
FVC-%PRED-PRE: 72 %
FVC-POST: 3.14 L
FVC-Pre: 3.12 L
POST FEV6/FVC RATIO: 100 %
PRE FEV1/FVC RATIO: 75 %
Post FEV1/FVC ratio: 78 %
Pre FEV6/FVC Ratio: 100 %

## 2018-11-02 LAB — NITRIC OXIDE: Nitric Oxide: 12

## 2018-11-02 NOTE — Patient Instructions (Addendum)
Pantoprazole (protonix) 40 mg   Take  30-60 min before first meal of the day and Pepcid (famotidine)  20 mg one after supper or prior to bedtime until return to office - this is the best way to tell whether stomach acid is contributing to your problem.     GERD (REFLUX)  is an extremely common cause of respiratory symptoms just like yours , many times with no obvious heartburn at all.    It can be treated with medication, but also with lifestyle changes including elevation of the head of your bed (ideally with 6 inch  bed blocks),  Smoking cessation, avoidance of late meals, excessive alcohol, and avoid fatty foods, chocolate, peppermint, colas, red wine, and acidic juices such as orange juice.  NO MINT OR MENTHOL PRODUCTS SO NO COUGH DROPS   USE SUGARLESS CANDY INSTEAD (Jolley ranchers or Stover's or Life Savers) or even ice chips will also do - the key is to swallow to prevent all throat clearing. NO OIL BASED VITAMINS - use powdered substitutes.  Continue symbicort 80 Take 2 puffs first thing in am and then another 2 puffs about 12 hours later.   Only combivent use the combivent to catch your breath    To get the most out of exercise, you need to be continuously aware that you are short of breath, but never out of breath, for 30 minutes daily. As you improve, it will actually be easier for you to do the same amount of exercise  in  30 minutes so always push to the level where you are short of breath.      Please schedule a follow up office visit in 4 weeks, sooner if needed  with all medications /inhalers/ solutions in hand so we can verify exactly what you are taking. This includes all medications from all doctors and over the counters

## 2018-11-02 NOTE — Progress Notes (Signed)
Brian Harrington, male    DOB: 11-06-1946     MRN: [id number]    Brief patient profile:  47 yowm quit smoking 1992  With some doe at that point  improved but since  around 2015 gradually worse sob/cough > [facility] dx copd rx saba plus another inhaler "something else"  helped some then placed on 02 around 2016 at 2lpm  referred to pulmonary clinic 10/01/2018 by Dr   Dettinger for refractory cough / sob  / sarcoid but no evidence of copd at initial eval     History of Present Illness  10/01/2018 Pulmonary / new pt eval / Brian Harrington   Re copd / thoroughly confused with meds  Chief Complaint  Patient presents with  . Pulmonary Consult    Referred by Dr. 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 Pt c/o cough "since the weather got hot"- at times he produces large amounts of yellow sputum.    Dyspnea:  avg day can do 2 aisles at [employer] on 02  Cough: more than usual worse p arising /worse p incruse and hot weather > mostly beige color x sev tbsp per day Sleep: flat bed 30 degrees hob using pillows  SABA use: combivent/neb variably better p use, not really using neb often "just with colds' Taking symbicort 160 x 2  First thing in am then Incruse lunchtime  Some better with pred in past and just got another rx but hasn't started  rec Plan A = Automatic = Symbicort 80 Take 2 puffs first thing in am and then another 2 puffs about 12 hours later.  Stop Incruse  Plan B = Backup Only use your combivent  as a rescue medication   Plan C = Crisis - only use your albuterol nebulizer if you first try Plan B and it fails to help > ok to use the nebulizer up to every 4 hours but if start needing it regularly call for immediate appointment Augmentin 875 mg take one pill twice daily  X 10 days   Prednisone take it per Dr D Pepcid 20 mg after breakfast and continue protonix before supper  GERD  Diet  Please remember to go to the lab and x-ray department downstairs in the basement  for your tests - we will call you with the  results when they are available.  Please schedule a follow up office visit in 4 weeks, call sooner if needed with all medications /inhalers/ solutions in hand so we can verify exactly what you are taking     11/02/2018  Extended summary f/u ov/Brian Harrington re:  Sob ? All upper airway / did not bring any meds  Chief Complaint  Patient presents with  . Follow-up    PFT's done today. Breathing is about the same. He is coughing less- non prod. He is using his combivent inhaler 2-3 x per day. He is using his neb about once per wk.   Dyspnea:  MMRC3 = can't walk 100 yards even at a slow pace at a flat grade s stopping due to sob   Cough: much better unless around perfume/  Sleeping:  Bed flat / wedge pillow o/w overt gerd SABA use: as above 02: none at hs/ sometimes daytime    Last time could tol brisk walk =  several years prior to OV    No obvious day to day or daytime variability or assoc excess/ purulent sputum or mucus plugs or hemoptysis or cp or chest tightness, subjective wheeze  or overt sinus or hb symptoms.   Sleeping  without nocturnal  or early am exacerbation  of respiratory  c/o's or need for noct saba. Also denies any obvious fluctuation of symptoms with weather or environmental changes or other aggravating or alleviating factors except as outlined above   No unusual exposure hx or h/o childhood pna/ asthma or knowledge of premature birth.  Current Allergies, Complete Past Medical History, Past Surgical History, Family History, and Social History were reviewed in [facility] record.  ROS  The following are not active complaints unless bolded Hoarseness, sore throat, dysphagia, dental problems, itching, sneezing,  nasal congestion or discharge of excess mucus or purulent secretions, ear ache,   fever, chills, sweats, unintended wt loss or wt gain, classically pleuritic or exertional cp,  orthopnea pnd or arm/hand swelling  or leg swelling, presyncope, palpitations,  abdominal pain, anorexia, nausea, vomiting, diarrhea  or change in bowel habits or change in bladder habits, change in stools or change in urine, dysuria, hematuria,  rash, arthralgias, visual complaints, headache, numbness, weakness or ataxia or problems with walking or coordination,  change in mood or  Memory.  Says he's losing wt but records don't support this since 09/08/18 ov         Current Meds- - NOTE:   Unable to verify as accurately reflecting what pt takes     Medication Sig  . albuterol (PROVENTIL) (2.5 MG/3ML) 0.083% nebulizer solution NEBULIZE 1 VIAL EVERY 6 HOURS AS NEEDED  . budesonide-formoterol (SYMBICORT) 80-4.5 MCG/ACT inhaler Inhale 2 puffs into the lungs 2 (two) times daily.  . carisoprodol (SOMA) 350 MG tablet Take 1 tablet (350 mg total) by mouth 3 (three) times daily as needed. for muscle spams  . COMBIVENT RESPIMAT 20-100 MCG/ACT AERS respimat USE 2 PUFFS 4 TIMES DAILY AS NEEDED  . famotidine (PEPCID) 20 MG tablet One after breakfast  . fluticasone (FLONASE) 50 MCG/ACT nasal spray Place 1 spray into both nostrils 2 (two) times daily as needed for allergies or rhinitis.  Marland Kitchen loratadine (CLARITIN) 10 MG tablet TAKE ONE (1) TABLET EACH DAY  . metoprolol tartrate (LOPRESSOR) 25 MG tablet Take 1 tablet (25 mg total) by mouth 2 (two) times daily.  . mupirocin ointment (BACTROBAN) 2 % Place 1 application into the nose. FOR 5 DAYS  . NARCAN 4 MG/0.1ML LIQD nasal spray kit USE 1 INHALATION NASALY AS NEEDED  . ondansetron (ZOFRAN) 4 MG tablet Take 1 tablet (4 mg total) by mouth every 6 (six) hours as needed for nausea.  Marland Kitchen oxyCODONE-acetaminophen (PERCOCET) 10-325 MG tablet Take 1 tablet by mouth every 8 (eight) hours as needed for pain.  . OXYGEN Inhale 2 L into the lungs continuous. 2 liters as needed   . pantoprazole (PROTONIX) 40 MG tablet TAKE ONE (1) TABLET EACH DAY  . sildenafil (REVATIO) 20 MG tablet Take 1 tablet (20 mg total) by mouth daily as needed.  . sodium chloride  (OCEAN) 0.65 % SOLN nasal spray Place 1 spray into both nostrils as needed for congestion.        FENO 11/02/2018  =    12          Objective:    Hoarse amb wm nad/ inappropriate affect at times   Wt Readings from Last 3 Encounters:  11/02/18 245 lb (111.1 kg)  10/01/18 242 lb (109.8 kg)  09/08/18 239 lb 3.2 oz (108.5 kg)     Vital signs reviewed - Note on arrival 02 sats  98% on RA       HEENT: full dentures, nl turbinates bilaterally, and oropharynx. Nl external ear canals without cough reflex   NECK :  without JVD/Nodes/TM/ nl carotid upstrokes bilaterally   LUNGS: no acc muscle use,  Nl contour chest which is clear to A and P bilaterally without cough on insp or exp maneuvers   CV:  RRR  no s3 or murmur or increase in P2, and no edema   ABD:  Tensely obese but   and nontender with nl inspiratory excursion in the supine position. No bruits or organomegaly appreciated, bowel sounds nl  MS:  Nl gait/ ext warm without deformities, calf tenderness, cyanosis or clubbing No obvious joint restrictions   SKIN: warm and dry without lesions    NEURO:  alert,  nl sensorium with  no motor or cerebellar deficits apparent.        CXR PA and Lateral:   10/01/2018 :    I personally reviewed images and agree with radiology impression as follows:   The heart size and mediastinal contours are stable without evidence of adenopathy. The previously demonstrated diffuse interstitial prominence has resolved. The lungs are now clear           Assessment   COPD  GOLD  0  Quit smoking 1992  Spirometry 10/01/2018  FEV1 1.9 (52%)  Ratio 83 s curvature off all rx   - 10/01/2018  After extensive coaching inhaler device,  effectiveness =    75% with hfa > reduced symbicort to 80 2bid as cough is the main concern    When respiratory symptoms begin or become refractory well after a patient reports complete smoking cessation,  Especially when this wasn't the case while they were  smoking, a red flag is raised based on the work of Dr Kris Mouton which states:  if you quit smoking when your best day FEV1 is still  l preserved it is highly unlikely you will progress to severe disease.  That is to say, once the smoking stops,  the symptoms should not suddenly erupt or markedly worsen.  If so, the differential diagnosis should include  obesity/deconditioning,  LPR/Reflux/Aspiration syndromes,  occult CHF, or  especially side effect of medications like DPI meds  commonly used in this population.     For now will drop back to symb 80 2bid and stop incruse and f/u in 4 weeks with a full set of pfst      Chronic respiratory failure with hypoxia ([place]) 10/01/2018   Walked RA x one lap @ 185 stopped due to  Sob no desats slow pace   No evidence of 02 needs at this point  Upper airway cough syndrome Allergy profile 10/01/2018 >  Eos 0.4/  IgE  Pending   - max rx for gerd 10/01/2018    Upper airway cough syndrome (previously labeled PNDS),  is so named because it's frequently impossible to sort out how much is  CR/sinusitis with freq throat clearing (which can be related to primary GERD)   vs  causing  secondary (" extra esophageal")  GERD from wide swings in gastric pressure that occur with throat clearing, often  promoting self use of mint and menthol lozenges that reduce the lower esophageal sphincter tone and exacerbate the problem further in a cyclical fashion.   These are the same pts (now being labeled as having "irritable larynx syndrome" by some cough centers) who not infrequently have a history of having failed to tolerate ace  inhibitors,  dry powder inhalers like incruse or biphosphonates or report having atypical/extraesophageal reflux symptoms that don't respond to standard doses of PPI  and are easily confused as having aecopd or asthma flares by even experienced allergists/ pulmonologists (myself included).   >>> REC: Take augmentin x 10 days and consider sinus CT if  cough not better Max rx for gerd given does not tol ppi well ? Needs to change to aciphex ?  pred x 5 days as planned      Sarcoidosis of lung (HCC) A good rule of thumb is that >95% of pts with active sarcoid in any organ will have some plain cxr changes - on the other hand  if there are active pulmonary symptoms the cxr will look much worse than the patient:  No evidence of either scenario here/ strongly doubt active dz and see no reason for now to pursue this dx further other than unexplained elevation of esr

## 2018-11-02 NOTE — Progress Notes (Signed)
PFT completed today.  

## 2018-11-03 ENCOUNTER — Encounter: Payer: Self-pay | Admitting: Internal Medicine

## 2018-11-03 DIAGNOSIS — R0609 Other forms of dyspnea: Secondary | ICD-10-CM | POA: Insufficient documentation

## 2018-11-03 NOTE — Assessment & Plan Note (Addendum)
Quit smoking 1992  Spirometry 10/01/2018  FEV1 1.9 (52%)  Ratio 83 s curvature off all rx   - 10/01/2018  After extensive coaching inhaler device,  effectiveness =    75% with hfa > reduced symbicort to 80 2bid as cough is the main concern - [facility] AT screen  10/01/18  MM/ level 185  - PFT's  11/02/2018  FEV1 2.46 (77 % ) ratio 78  p 4 % improvement from saba p nothing prior to study with DLCO  67/70c % corrects to 89 % for alv volume   - 11/02/2018  Walked RA x 3 laps @ 185 ft each stopped due to  End of study, nl pace, no   desat - min sob   - The proper method of use, as well as anticipated side effects, of a metered-dose inhaler are discussed and demonstrated to the patient.       Advised to continue symb 80 2bid and prn combivent for now until sort out whether or not has AB component but he does not have significant copd and should be classified GOLD 0 for that reason (at risk)

## 2018-11-03 NOTE — Assessment & Plan Note (Addendum)
Allergy profile 10/01/2018 >  Eos 0.4/  IgE 182  RAST pos dust only    - max rx for gerd 10/01/2018 > did not take as rec  - FENO 11/02/2018  w=    12 while on symb 80 does not support active asthma    Upper airway cough syndrome (previously labeled PNDS),  is so named because it's frequently impossible to sort out how much is  CR/sinusitis with freq throat clearing (which can be related to primary GERD)   vs  causing  secondary (" extra esophageal")  GERD from wide swings in gastric pressure that occur with throat clearing, often  promoting self use of mint and menthol lozenges that reduce the lower esophageal sphincter tone and exacerbate the problem further in a cyclical fashion.   These are the same pts (now being labeled as having "irritable larynx syndrome" by some cough centers) who not infrequently have a history of having failed to tolerate ace inhibitors,  dry powder inhalers or biphosphonates or report having atypical/extraesophageal reflux symptoms that don't respond to standard doses of PPI  and are easily confused as having aecopd or asthma flares by even experienced allergists/ pulmonologists (myself included).   Hx has changed and reports mainly coughing around non-specific irritants like perfume but note has not been taking gerd rx correctly so rec he do so and if cough persists can consider trial of gabapentin 100 tid.

## 2018-11-03 NOTE — Assessment & Plan Note (Addendum)
11/02/2018  Walked RA x 3 laps @ 185 ft each stopped due to  End of study, nl pace, no   desat / min sob      Symptoms are markedly disproportionate to objective findings and not clear to what extent this is actually a pulmonary  problem but pt does appear to have difficult to sort out respiratory symptoms of unknown origin for which  DDX  = almost all start with A and  include Adherence, Ace Inhibitors, Acid Reflux, Active Sinus Disease, Alpha 1 Antitripsin deficiency, Anxiety masquerading as Airways dz,  ABPA,  Allergy(esp in young), Aspiration (esp in elderly), Adverse effects of meds,  Active smoking or Vaping, A bunch of PE's/clot burden (a few small clots can't cause this syndrome unless there is already severe underlying pulm or vascular dz with poor reserve),  Anemia or thyroid disorder, plus two Bs  = Bronchiectasis and Beta blocker use..and one C= CHF     Adherence is always the initial "prime suspect" and is a multilayered concern that requires a "trust but verify" approach in every patient - starting with knowing how to use medications, especially inhalers, correctly, keeping up with refills and understanding the fundamental difference between maintenance and prns vs those medications only taken for a very short course and then stopped and not refilled.  - see hfa teaching - return with all meds in hand using a trust but verify approach to confirm accurate Medication  Reconciliation The principal here is that until we are certain that the  patients are doing what we've asked, it makes no sense to ask them to do more.   ? Acid (or non-acid) GERD > always difficult to exclude as up to 75% of pts in some series report no assoc GI/ Heartburn symptoms> rec max (24h)  acid suppression and diet restrictions/ reviewed and instructions given in writing.   ? Allergy/ asthma > feno does not support active asthma explaining his symptoms so unlikely he needs more ICS >>> rec continue symb 80 2bid/  clariton  ? Anxiety /depression/ obesity deconditioning > usually at the bottom of this list of usual suspects but should be much higher on this pt's based on H and P and  may interfere with adherence and also interpretation of response or lack thereof to symptom management which can be quite subjective.   ? Alpha one > ruled out already  ? Cardiac >  Needs cpst to sort out p he first tries reconditioning     I had an extended discussion with the patient reviewing all relevant studies completed to date and  lasting 25 minutes of a 40  minute summary office  visit addressing severe non-specific but potentially very serious refractory respiratory symptoms of uncertain and potentially multiple  etiologies.   Each maintenance medication was reviewed in detail including most importantly the difference between maintenance and prns and under what circumstances the prns are to be triggered using an action plan format that is not reflected in the computer generated alphabetically organized AVS.    Please see AVS for specific instructions unique to this office visit that I personally wrote and verbalized to the the pt in detail and then reviewed with pt  by my nurse highlighting any changes in therapy/plan of care  recommended at today's visit.

## 2018-11-18 DIAGNOSIS — D869 Sarcoidosis, unspecified: Secondary | ICD-10-CM | POA: Diagnosis not present

## 2018-12-02 ENCOUNTER — Ambulatory Visit: Payer: 59 | Admitting: Internal Medicine

## 2018-12-08 ENCOUNTER — Ambulatory Visit: Payer: Medicare HMO | Admitting: Family Medicine

## 2018-12-14 ENCOUNTER — Encounter: Payer: Self-pay | Admitting: Family Medicine

## 2018-12-18 DIAGNOSIS — D869 Sarcoidosis, unspecified: Secondary | ICD-10-CM | POA: Diagnosis not present

## 2019-01-18 DIAGNOSIS — D869 Sarcoidosis, unspecified: Secondary | ICD-10-CM | POA: Diagnosis not present

## 2019-01-29 DIAGNOSIS — D869 Sarcoidosis, unspecified: Secondary | ICD-10-CM | POA: Diagnosis not present

## 2019-02-18 DIAGNOSIS — D869 Sarcoidosis, unspecified: Secondary | ICD-10-CM | POA: Diagnosis not present

## 2019-03-19 DIAGNOSIS — D869 Sarcoidosis, unspecified: Secondary | ICD-10-CM | POA: Diagnosis not present

## 2019-04-19 DIAGNOSIS — D869 Sarcoidosis, unspecified: Secondary | ICD-10-CM | POA: Diagnosis not present

## 2019-05-02 ENCOUNTER — Other Ambulatory Visit: Payer: Self-pay | Admitting: Family Medicine

## 2019-05-02 DIAGNOSIS — M5136 Other intervertebral disc degeneration, lumbar region: Secondary | ICD-10-CM

## 2019-05-02 DIAGNOSIS — F1120 Opioid dependence, uncomplicated: Secondary | ICD-10-CM

## 2019-05-04 NOTE — Telephone Encounter (Signed)
We can discuss this at his appointment tomorrow.

## 2019-05-05 ENCOUNTER — Ambulatory Visit (INDEPENDENT_AMBULATORY_CARE_PROVIDER_SITE_OTHER): Payer: Medicare HMO | Admitting: Family Medicine

## 2019-05-05 ENCOUNTER — Other Ambulatory Visit: Payer: Self-pay

## 2019-05-05 NOTE — Progress Notes (Signed)
Attempted to call up in the patient 3 times and no answer and left a voicemail and no response. Arville Care, MD [facility] 05/05/2019, 10:57 AM

## 2019-05-11 ENCOUNTER — Other Ambulatory Visit: Payer: Self-pay

## 2019-05-11 ENCOUNTER — Other Ambulatory Visit: Payer: Self-pay | Admitting: *Deleted

## 2019-05-11 ENCOUNTER — Encounter: Payer: Self-pay | Admitting: Family Medicine

## 2019-05-11 ENCOUNTER — Ambulatory Visit (INDEPENDENT_AMBULATORY_CARE_PROVIDER_SITE_OTHER): Payer: Medicare HMO | Admitting: Family Medicine

## 2019-05-11 DIAGNOSIS — J309 Allergic rhinitis, unspecified: Secondary | ICD-10-CM | POA: Diagnosis not present

## 2019-05-11 DIAGNOSIS — K219 Gastro-esophageal reflux disease without esophagitis: Secondary | ICD-10-CM

## 2019-05-11 DIAGNOSIS — F1120 Opioid dependence, uncomplicated: Secondary | ICD-10-CM

## 2019-05-11 DIAGNOSIS — J449 Chronic obstructive pulmonary disease, unspecified: Secondary | ICD-10-CM | POA: Diagnosis not present

## 2019-05-11 DIAGNOSIS — M5136 Other intervertebral disc degeneration, lumbar region: Secondary | ICD-10-CM | POA: Diagnosis not present

## 2019-05-11 DIAGNOSIS — I10 Essential (primary) hypertension: Secondary | ICD-10-CM

## 2019-05-11 DIAGNOSIS — E7800 Pure hypercholesterolemia, unspecified: Secondary | ICD-10-CM

## 2019-05-11 MED ORDER — OXYCODONE-ACETAMINOPHEN 10-325 MG PO TABS: 1.0000 | ORAL_TABLET | Freq: Three times a day (TID) | ORAL | 0 refills | Status: DC | PRN

## 2019-05-11 MED ORDER — CARISOPRODOL 350 MG PO TABS: 350.0000 mg | ORAL_TABLET | Freq: Three times a day (TID) | ORAL | 1 refills | Status: DC | PRN

## 2019-05-11 MED ORDER — METOPROLOL TARTRATE 25 MG PO TABS: 25.0000 mg | ORAL_TABLET | Freq: Two times a day (BID) | ORAL | 3 refills | Status: AC

## 2019-05-11 MED ORDER — PANTOPRAZOLE SODIUM 40 MG PO TBEC: DELAYED_RELEASE_TABLET | ORAL | 3 refills | Status: DC

## 2019-05-11 MED ORDER — LORATADINE 10 MG PO TABS: 10.0000 mg | ORAL_TABLET | Freq: Every day | ORAL | 3 refills | Status: DC

## 2019-05-11 MED ORDER — NALOXONE HCL 4 MG/0.1ML NA LIQD: 1.0000 | Freq: Once | NASAL | 1 refills | Status: DC

## 2019-05-11 MED ORDER — FAMOTIDINE 20 MG PO TABS: ORAL_TABLET | ORAL | 3 refills | Status: DC

## 2019-05-11 MED ORDER — FLUTICASONE PROPIONATE 50 MCG/ACT NA SUSP: 1.0000 | Freq: Two times a day (BID) | NASAL | 6 refills | Status: DC | PRN

## 2019-05-11 MED ORDER — BUDESONIDE-FORMOTEROL FUMARATE 80-4.5 MCG/ACT IN AERO: 2.0000 | INHALATION_SPRAY | Freq: Two times a day (BID) | RESPIRATORY_TRACT | 11 refills | Status: DC

## 2019-05-11 NOTE — Progress Notes (Signed)
Virtual Visit via telephone Note  I connected with Brian Harrington on 05/11/19 at [phone] by telephone and verified that I am speaking with the correct person using two identifiers. Brian Harrington is currently located at home and no other people are currently with her during visit. The provider, Fransisca Kaufmann Dettinger, MD is located in their office at time of visit.  Call ended at [place]  I discussed the limitations, risks, security and privacy concerns of performing an evaluation and management service by telephone and the availability of in person appointments. I also discussed with the patient that there may be a patient responsible charge related to this service. The patient expressed understanding and agreed to proceed.   History and Present Illness: Patient was incarcerated and that was why he has not been in  COPD Patient is coming in for COPD recheck today.  He is currently on [place] but it has not worked well like his Symbicort that he had before and would like to go back on his Symbicort, this is what they gave him when he was incarcerated.  He has a mild chronic cough but denies any major coughing spells or wheezing spells.  He has 2nighttime symptoms per week and 4daytime symptoms per week currently.   Hypertension Patient is currently on Toprol, and their blood pressure today is unknown because he does not have a way to check currently. Patient denies any lightheadedness or dizziness. Patient denies headaches, blurred vision, chest pains, shortness of breath, or weakness. Denies any side effects from medication and is content with current medication.   Hyperlipidemia Patient is coming in for recheck of his hyperlipidemia. The patient is currently taking no medication currently. They deny any issues with myalgias or history of liver damage from it. They deny any focal numbness or weakness or chest pain.   GERD Patient is currently on pantoprazole but feels like he did better with the  famotidine, he does not have any major symptoms but just some minor ones.  She denies any major symptoms or abdominal pain or belching or burping. She denies any blood in her stool or lightheadedness or dizziness.   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  He has not been taking anything except the Manuela Neptune which she had a few left that his brother help for him since being incarcerated, patient will need a visit to check up on all of this in person in 2 months with urine drug screening and blood work.  Still in his lower back on both sides and denies any numbness or weakness or loss of bowel or bladder.  No diagnosis found.  Outpatient Encounter Medications as of 05/11/2019  Medication Sig  . albuterol (PROVENTIL) (2.5 MG/3ML) 0.083% nebulizer solution NEBULIZE 1 VIAL EVERY 6 HOURS AS NEEDED  . budesonide-formoterol (SYMBICORT) 80-4.5 MCG/ACT inhaler Inhale 2 puffs into the lungs 2 (two) times daily.  . carisoprodol (SOMA) 350 MG tablet Take 1 tablet (350 mg total) by mouth 3 (three) times daily as needed. for muscle spams  . COMBIVENT RESPIMAT 20-100 MCG/ACT AERS respimat USE 2 PUFFS 4 TIMES DAILY AS NEEDED  . famotidine (PEPCID) 20 MG tablet One after breakfast  . fluticasone (FLONASE) 50 MCG/ACT  nasal spray Place 1 spray into both nostrils 2 (two) times daily as needed for allergies or rhinitis.  Marland Kitchen loratadine (CLARITIN) 10 MG tablet TAKE ONE (1) TABLET EACH DAY  . metoprolol tartrate (LOPRESSOR) 25 MG tablet Take 1 tablet (25 mg total) by mouth 2 (two) times daily.  . mupirocin ointment (BACTROBAN) 2 % Place 1 application into the nose. FOR 5 DAYS  . NARCAN 4 MG/0.1ML LIQD nasal spray kit USE 1 INHALATION NASALY AS NEEDED  . ondansetron  (ZOFRAN) 4 MG tablet Take 1 tablet (4 mg total) by mouth every 6 (six) hours as needed for nausea.  . OXYGEN Inhale 2 L into the lungs continuous. 2 liters as needed   . pantoprazole (PROTONIX) 40 MG tablet TAKE ONE (1) TABLET EACH DAY  . sildenafil (REVATIO) 20 MG tablet Take 1 tablet (20 mg total) by mouth daily as needed.  . sodium chloride (OCEAN) 0.65 % SOLN nasal spray Place 1 spray into both nostrils as needed for congestion.  . [DISCONTINUED] oxyCODONE-acetaminophen (PERCOCET) 10-325 MG tablet Take 1 tablet by mouth every 8 (eight) hours as needed for pain.   No facility-administered encounter medications on file as of 05/11/2019.     Review of Systems  Constitutional: Negative for chills and fever.  Eyes: Negative for visual disturbance.  Respiratory: Negative for shortness of breath and wheezing.   Cardiovascular: Negative for chest pain and leg swelling.  Musculoskeletal: Positive for arthralgias and back pain. Negative for gait problem.  Skin: Negative for rash.  Neurological: Negative for dizziness, weakness, light-headedness and numbness.  All other systems reviewed and are negative.   Observations/Objective: Patient sounds comfortable and in no acute distress  Assessment and Plan: Problem List Items Addressed This Visit      Cardiovascular and Mediastinum   HYPERTENSION, BENIGN   Relevant Medications   metoprolol tartrate (LOPRESSOR) 25 MG tablet     Respiratory   COPD  GOLD  0    Relevant Medications   budesonide-formoterol (SYMBICORT) 80-4.5 MCG/ACT inhaler   fluticasone (FLONASE) 50 MCG/ACT nasal spray   loratadine (CLARITIN) 10 MG tablet   Chronic allergic rhinitis   Relevant Medications   fluticasone (FLONASE) 50 MCG/ACT nasal spray   loratadine (CLARITIN) 10 MG tablet     Digestive   GERD (gastroesophageal reflux disease)   Relevant Medications   pantoprazole (PROTONIX) 40 MG tablet   famotidine (PEPCID) 20 MG tablet     Musculoskeletal and  Integument   Degenerative disc disease, lumbar - Primary   Relevant Medications   carisoprodol (SOMA) 350 MG tablet   oxyCODONE-acetaminophen (PERCOCET) 10-325 MG tablet   oxyCODONE-acetaminophen (PERCOCET) 10-325 MG tablet     Other   Hypercholesterolemia   Relevant Medications   metoprolol tartrate (LOPRESSOR) 25 MG tablet    Other Visit Diagnoses    Uncomplicated opioid dependence ([place])       Relevant Medications   carisoprodol (SOMA) 350 MG tablet   oxyCODONE-acetaminophen (PERCOCET) 10-325 MG tablet       Follow Up Instructions: Follow-up in 2 months for labs and recheck of chronic pain, will do urine drug screen    I discussed the assessment and treatment plan with the patient. The patient was provided an opportunity to ask questions and all were answered. The patient agreed with the plan and demonstrated an understanding of the instructions.   The patient was advised to call back or seek an in-person evaluation if the symptoms worsen or if the condition fails to improve as  anticipated.  The above assessment and management plan was discussed with the patient. The patient verbalized understanding of and has agreed to the management plan. Patient is aware to call the clinic if symptoms persist or worsen. Patient is aware when to return to the clinic for a follow-up visit. Patient educated on when it is appropriate to go to the emergency department.    I provided 26 minutes of non-face-to-face time during this encounter.    Worthy Rancher, MD

## 2019-05-19 ENCOUNTER — Ambulatory Visit (INDEPENDENT_AMBULATORY_CARE_PROVIDER_SITE_OTHER): Payer: Medicare HMO | Admitting: Internal Medicine

## 2019-05-19 ENCOUNTER — Other Ambulatory Visit: Payer: Self-pay

## 2019-05-19 ENCOUNTER — Encounter: Payer: Self-pay | Admitting: Internal Medicine

## 2019-05-19 DIAGNOSIS — R058 Other specified cough: Secondary | ICD-10-CM

## 2019-05-19 DIAGNOSIS — R0609 Other forms of dyspnea: Secondary | ICD-10-CM | POA: Diagnosis not present

## 2019-05-19 DIAGNOSIS — R05 Cough: Secondary | ICD-10-CM

## 2019-05-19 DIAGNOSIS — J449 Chronic obstructive pulmonary disease, unspecified: Secondary | ICD-10-CM | POA: Diagnosis not present

## 2019-05-19 MED ORDER — IPRATROPIUM-ALBUTEROL 20-100 MCG/ACT IN AERS: INHALATION_SPRAY | RESPIRATORY_TRACT | 0 refills | Status: DC

## 2019-05-19 MED ORDER — BUDESONIDE-FORMOTEROL FUMARATE 80-4.5 MCG/ACT IN AERO: INHALATION_SPRAY | RESPIRATORY_TRACT | 11 refills | Status: DC

## 2019-05-19 MED ORDER — BUDESONIDE-FORMOTEROL FUMARATE 80-4.5 MCG/ACT IN AERO: 2.0000 | INHALATION_SPRAY | Freq: Two times a day (BID) | RESPIRATORY_TRACT | 0 refills | Status: DC

## 2019-05-19 NOTE — Patient Instructions (Addendum)
Plan A = Automatic = symbicort 80 Take 2 puffs first thing in am and then another 2 puffs about 12 hours later.    Work on inhaler technique:  relax and gently blow all the way out then take a nice smooth deep breath back in, triggering the inhaler at same time you start breathing in.  Hold for up to 5 seconds if you can. Blow out thru nose. Rinse and gargle with water when done      Plan B = Backup Only use your combivent  inhaler as a rescue medication to be used if you can't catch your breath by resting or doing a relaxed purse lip breathing pattern.  - The less you use it, the better it will work when you need it. - Ok to use the inhaler up to 1 puffs  every 4 hours if you must but call for appointment if use goes up over your usual need - Don't leave home without it !!  (think of it like the spare tire for your car)   Please schedule a follow up visit in 3 months but call sooner if needed  with all medications /inhalers/ solutions in hand so we can verify exactly what you are taking. This includes all medications from all doctors and over the counters

## 2019-05-19 NOTE — Progress Notes (Signed)
Brian Harrington, male    DOB: [birth year]     MRN: [id number]    Brief patient profile:  22 yowm quit smoking 1992  With some doe at that point  improved but since  around 2015 gradually worse sob/cough > [place] [facility] dx copd rx saba plus another inhaler "something else"  helped some then placed on 02 around 2016 at 2lpm  referred to pulmonary clinic 10/01/2018 by Dr   Dettinger for refractory cough / sob  / sarcoid but no evidence of copd at initial eval     History of Present Illness  10/01/2018 Pulmonary / new pt eval / Brian Harrington   Re copd / thoroughly confused with meds  Chief Complaint  Patient presents with  . Pulmonary Consult    Referred by Dr. 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 Pt c/o cough "since the weather got hot"- at times he produces large amounts of yellow sputum.    Dyspnea:  avg day can do 2 aisles at [employer] on 02  Cough: more than usual worse p arising /worse p incruse and hot weather > mostly beige color x sev tbsp per day Sleep: flat bed 30 degrees hob using pillows  SABA use: combivent/neb variably better p use, not really using neb often "just with colds' Taking symbicort 160 x 2  First thing in am then Incruse lunchtime  Some better with pred in past and just got another rx but hasn't started  rec Plan A = Automatic = Symbicort 80 Take 2 puffs first thing in am and then another 2 puffs about 12 hours later.  Stop Incruse  Plan B = Backup Only use your combivent  as a rescue medication   Plan C = Crisis - only use your albuterol nebulizer if you first try Plan B and it fails to help > ok to use the nebulizer up to every 4 hours but if start needing it regularly call for immediate appointment Augmentin 875 mg take one pill twice daily  X 10 days   Prednisone take it per Dr D Pepcid 20 mg after breakfast and continue protonix before supper  GERD  Diet  Please remember to go to the lab and x-ray department downstairs in the basement  for your tests - we will call you with the  results when they are available.  Please schedule a follow up office visit in 4 weeks, call sooner if needed with all medications /inhalers/ solutions in hand so we can verify exactly what you are taking     11/02/2018  Extended summary f/u ov/Brian Harrington re:  Sob ? All upper airway / did not bring any meds  Chief Complaint  Patient presents with  . Follow-up    PFT's done today. Breathing is about the same. He is coughing less- non prod. He is using his combivent inhaler 2-3 x per day. He is using his neb about once per wk.   Dyspnea:  MMRC3 = can't walk 100 yards even at a slow pace at a flat grade s stopping due to sob   Cough: much better unless around perfume/  Sleeping:  Bed flat / wedge pillow o/w overt gerd SABA use: as above 02: none at hs/ sometimes daytime   Last time could tol brisk walk =  several years prior to OV   rec Pantoprazole (protonix) 40 mg   Take  30-60 min before first meal of the day and Pepcid (famotidine)  20 mg one after supper or  prior to bedtime until return to office - this is the best way to tell whether stomach acid is contributing to your problem.   GERD diet  Continue symbicort 80 Take 2 puffs first thing in am and then another 2 puffs about 12 hours later.  Only combivent use the combivent to catch your breath To get the most out of exercise, you need to be continuously aware that you are short of breath, but never out of breath, for 30 minutes daily.    05/19/2019  f/u ov/Brian Harrington re: cough variant asthma/ uacs  Obesity/ deconditioning Chief Complaint  Patient presents with  . Follow-up    He states his breathing continues at his baseline, no new concerns. He needs a new nebulizer supplies.   Dyspnea:  MMRC2 = can't walk a nl pace on a flat grade s sob but does fine slow and flat 3 Cough: none Sleeping: on side falt bed SABA use: none 02: none    No obvious day to day or daytime variability or assoc excess/ purulent sputum or mucus plugs or hemoptysis or  cp or chest tightness, subjective wheeze or overt sinus or hb symptoms.   Sleeping as above  without nocturnal  or early am exacerbation  of respiratory  c/o's or need for noct saba. Also denies any obvious fluctuation of symptoms with weather or environmental changes or other aggravating or alleviating factors except as outlined above   No unusual exposure hx or h/o childhood pna/ asthma or knowledge of premature birth.  Current Allergies, Complete Past Medical History, Past Surgical History, Family History, and Social History were reviewed in [employer] record.  ROS  The following are not active complaints unless bolded Hoarseness, sore throat, dysphagia, dental problems, itching, sneezing,  nasal congestion or discharge of excess mucus or purulent secretions, ear ache,   fever, chills, sweats, unintended wt loss or wt gain, classically pleuritic or exertional cp,  orthopnea pnd or arm/hand swelling  or leg swelling, presyncope, palpitations, abdominal pain, anorexia, nausea, vomiting, diarrhea  or change in bowel habits or change in bladder habits, change in stools or change in urine, dysuria, hematuria,  rash, arthralgias, visual complaints, headache, numbness, weakness or ataxia or problems with walking or coordination,  change in mood or  memory.        Current Meds  Medication Sig  . albuterol (PROVENTIL) (2.5 MG/3ML) 0.083% nebulizer solution NEBULIZE 1 VIAL EVERY 6 HOURS AS NEEDED  . budesonide-formoterol (SYMBICORT) 80-4.5 MCG/ACT inhaler Inhale 2 puffs into the lungs 2 (two) times daily.  . carisoprodol (SOMA) 350 MG tablet Take 1 tablet (350 mg total) by mouth 3 (three) times daily as needed. for muscle spams  . COMBIVENT RESPIMAT 20-100 MCG/ACT AERS respimat USE 2 PUFFS 4 TIMES DAILY AS NEEDED  . famotidine (PEPCID) 20 MG tablet One after breakfast  . fluticasone (FLONASE) 50 MCG/ACT nasal spray Place 1 spray into both nostrils 2 (two) times daily as needed for  allergies or rhinitis.  Marland Kitchen loratadine (CLARITIN) 10 MG tablet Take 1 tablet (10 mg total) by mouth daily.  . metoprolol tartrate (LOPRESSOR) 25 MG tablet Take 1 tablet (25 mg total) by mouth 2 (two) times daily.  Marland Kitchen oxyCODONE-acetaminophen (PERCOCET) 10-325 MG tablet Take 1 tablet by mouth every 8 (eight) hours as needed for pain.  Marland Kitchen oxyCODONE-acetaminophen (PERCOCET) 10-325 MG tablet Take 1 tablet by mouth every 8 (eight) hours as needed for pain. Do not refill for 30 days from prescription date  .  OXYGEN Inhale 2 L into the lungs continuous. 2 liters as needed   . pantoprazole (PROTONIX) 40 MG tablet TAKE ONE (1) TABLET EACH DAY                    Objective:    amb slt hoarse wm nad   05/19/2019       269   11/02/18 245 lb (111.1 kg)  10/01/18 242 lb (109.8 kg)  09/08/18 239 lb 3.2 oz (108.5 kg)    Vital signs reviewed - Note on arrival 02 sats  96% on RA      HEENT: full dentures/ nl turbinates bilaterally, and oropharynx. Nl external ear canals without cough reflex   NECK :  without JVD/Nodes/TM/ nl carotid upstrokes bilaterally   LUNGS: no acc muscle use,  Nl contour chest which is clear to A and P bilaterally without cough on insp or exp maneuvers   CV:  RRR  no s3 or murmur or increase in P2, and no edema    ABD:  tensely obese/   nontender with nl inspiratory excursion in the supine position. No bruits or organomegaly appreciated, bowel sounds nl  MS:  Nl gait/ ext warm without deformities, calf tenderness, cyanosis or clubbing No obvious joint restrictions   SKIN: warm and dry without lesions    NEURO:  alert, approp, nl sensorium with  no motor or cerebellar deficits apparent.              Assessment

## 2019-05-23 ENCOUNTER — Encounter: Payer: Self-pay | Admitting: Internal Medicine

## 2019-05-23 NOTE — Assessment & Plan Note (Signed)
Allergy profile 10/01/2018 >  Eos 0.4/  IgE 182  RAST pos dust only    - max rx for gerd 10/01/2018    Adequate control on present rx, reviewed in detail with pt > no change in rx needed  = ppi and h2 hs/ diet

## 2019-05-23 NOTE — Assessment & Plan Note (Signed)
Quit smoking 1992  Spirometry 10/01/2018  FEV1 1.9 (52%)  Ratio 83 s curvature off all rx   - 10/01/2018  After extensive coaching inhaler device,  effectiveness =    75% with hfa > reduced symbicort to 80 2bid as cough is the main concern - [facility] AT screen  10/01/18  MM/ level 185  - PFT's  11/02/2018  FEV1 2.46 (77 % ) ratio 78  p 4 % improvement from saba p nothing prior to study with DLCO  67/70c % corrects to 89 % for alv volume   - 05/19/2019  After extensive coaching inhaler device,  effectiveness =    75% > continue symb 80 2bid   Pattern is more AB than classic copd so no change in rx needed

## 2019-05-23 NOTE — Assessment & Plan Note (Signed)
11/02/2018  Walked RA x 3 laps @ 185 ft each stopped due to  End of study, nl pace, no   desat / min sob     rec continue ex 30 min daily at level where sob but never oob   I had an extended discussion with the patient reviewing all relevant studies completed to date and  lasting 15 to 20 minutes of a 25 minute visit    See device teaching which extended face to face time for this visit.  Each maintenance medication was reviewed in detail including emphasizing most importantly the difference between maintenance and prns and under what circumstances the prns are to be triggered using an action plan format that is not reflected in the computer generated alphabetically organized AVS which I have not found useful in most complex patients, especially with respiratory illnesses  Please see AVS for specific instructions unique to this visit that I personally wrote and verbalized to the the pt in detail and then reviewed with pt  by my nurse highlighting any  changes in therapy recommended at today's visit to their plan of care.

## 2019-06-07 ENCOUNTER — Other Ambulatory Visit: Payer: Self-pay | Admitting: Family Medicine

## 2019-06-10 ENCOUNTER — Other Ambulatory Visit: Payer: Self-pay | Admitting: Family Medicine

## 2019-06-10 DIAGNOSIS — M5136 Other intervertebral disc degeneration, lumbar region: Secondary | ICD-10-CM

## 2019-06-10 DIAGNOSIS — F1120 Opioid dependence, uncomplicated: Secondary | ICD-10-CM

## 2019-07-11 ENCOUNTER — Other Ambulatory Visit: Payer: Self-pay | Admitting: Family Medicine

## 2019-07-11 DIAGNOSIS — M5136 Other intervertebral disc degeneration, lumbar region: Secondary | ICD-10-CM

## 2019-07-11 DIAGNOSIS — F1120 Opioid dependence, uncomplicated: Secondary | ICD-10-CM

## 2019-07-11 DIAGNOSIS — M51369 Other intervertebral disc degeneration, lumbar region without mention of lumbar back pain or lower extremity pain: Secondary | ICD-10-CM

## 2019-07-14 ENCOUNTER — Encounter: Payer: Self-pay | Admitting: Family Medicine

## 2019-07-14 ENCOUNTER — Ambulatory Visit (INDEPENDENT_AMBULATORY_CARE_PROVIDER_SITE_OTHER): Payer: Medicare HMO | Admitting: Family Medicine

## 2019-07-14 DIAGNOSIS — M150 Primary generalized (osteo)arthritis: Secondary | ICD-10-CM | POA: Diagnosis not present

## 2019-07-14 DIAGNOSIS — M159 Polyosteoarthritis, unspecified: Secondary | ICD-10-CM

## 2019-07-14 DIAGNOSIS — M5136 Other intervertebral disc degeneration, lumbar region: Secondary | ICD-10-CM

## 2019-07-14 DIAGNOSIS — F1120 Opioid dependence, uncomplicated: Secondary | ICD-10-CM

## 2019-07-14 MED ORDER — CARISOPRODOL 350 MG PO TABS: 350.0000 mg | ORAL_TABLET | Freq: Three times a day (TID) | ORAL | 2 refills | Status: DC | PRN

## 2019-07-14 MED ORDER — OXYCODONE-ACETAMINOPHEN 10-325 MG PO TABS: 1.0000 | ORAL_TABLET | Freq: Three times a day (TID) | ORAL | 0 refills | Status: DC | PRN

## 2019-07-14 NOTE — Progress Notes (Signed)
Virtual Visit via telephone Note  I connected with Brian Harrington on 07/14/19 at 1635 by telephone and verified that I am speaking with the correct person using two identifiers. JASIN BRAZEL is currently located at home and no other people are currently with her during visit. The provider, Fransisca Kaufmann Dettinger, MD is located in their office at time of visit.  Call ended at 1648  I discussed the limitations, risks, security and privacy concerns of performing an evaluation and management service by telephone and the availability of in person appointments. I also discussed with the patient that there may be a patient responsible charge related to this service. The patient expressed understanding and agreed to proceed.   History and Present Illness: Pain assessment: Cause of pain- back pain and degeneration disc disease Pain location- back pain Pain on scale of 1-10- 2 Frequency- daily What increases pain-prolonged resting and prolonged standing What makes pain Better-muscle relaxer and medication Effects on ADL - limited very minimally Any change in general medical condition-none  Current opioids rx- percocet 10-325 tid prn # meds rx- 70 Effectiveness of current meds-working well Adverse reactions form pain meds-none Morphine equivalent- 30  Pill count performed-No Last drug screen - n/a ( high risk q28m, moderate risk q35m, low risk yearly ) Urine drug screen today- No Was the [facility] reviewed- yes  If yes were their any concerning findings? - none  No flowsheet data found.   Pain contract signed on: none and will get at next visit  No diagnosis found.  Outpatient Encounter Medications as of 07/14/2019  Medication Sig  . albuterol (PROVENTIL) (2.5 MG/3ML) 0.083% nebulizer solution NEBULIZE 1 VIAL EVERY 6 HOURS AS NEEDED  . budesonide-formoterol (SYMBICORT) 80-4.5 MCG/ACT inhaler Take 2 puffs first thing in am and then another 2 puffs about 12 hours later.  .  budesonide-formoterol (SYMBICORT) 80-4.5 MCG/ACT inhaler Inhale 2 puffs into the lungs 2 (two) times a day.  . carisoprodol (SOMA) 350 MG tablet Take 1 tablet (350 mg total) by mouth 3 (three) times daily as needed. for muscle spams  . famotidine (PEPCID) 20 MG tablet One after breakfast  . fluticasone (FLONASE) 50 MCG/ACT nasal spray Place 1 spray into both nostrils 2 (two) times daily as needed for allergies or rhinitis.  . Ipratropium-Albuterol (COMBIVENT RESPIMAT) 20-100 MCG/ACT AERS respimat On puff every 4 hours as needed  . loratadine (CLARITIN) 10 MG tablet Take 1 tablet (10 mg total) by mouth daily.  . metoprolol tartrate (LOPRESSOR) 25 MG tablet Take 1 tablet (25 mg total) by mouth 2 (two) times daily.  Marland Kitchen oxyCODONE-acetaminophen (PERCOCET) 10-325 MG tablet Take 1 tablet by mouth every 8 (eight) hours as needed for pain.  Marland Kitchen oxyCODONE-acetaminophen (PERCOCET) 10-325 MG tablet Take 1 tablet by mouth every 8 (eight) hours as needed for pain. Do not refill for 30 days from prescription date  . OXYGEN Inhale 2 L into the lungs continuous. 2 liters as needed   . pantoprazole (PROTONIX) 40 MG tablet TAKE ONE (1) TABLET EACH DAY  . sildenafil (REVATIO) 20 MG tablet TAKE ONE TABLET BY MOUTH DAILY AS NEEDED   No facility-administered encounter medications on file as of 07/14/2019.     Review of Systems  Constitutional: Negative for chills and fever.  Respiratory: Negative for shortness of breath and wheezing.   Cardiovascular: Negative for chest pain and leg swelling.  Musculoskeletal: Positive for arthralgias and back pain. Negative for gait problem.  Skin: Negative for rash.  All other  systems reviewed and are negative.   Observations/Objective: Patient sounds comfortable and in no acute distress  Assessment and Plan: Problem List Items Addressed This Visit      Musculoskeletal and Integument   Osteoarthritis - Primary   Relevant Medications   oxyCODONE-acetaminophen (PERCOCET)  10-325 MG tablet   oxyCODONE-acetaminophen (PERCOCET) 10-325 MG tablet   oxyCODONE-acetaminophen (PERCOCET) 10-325 MG tablet   carisoprodol (SOMA) 350 MG tablet   Degenerative disc disease, lumbar   Relevant Medications   oxyCODONE-acetaminophen (PERCOCET) 10-325 MG tablet   oxyCODONE-acetaminophen (PERCOCET) 10-325 MG tablet   oxyCODONE-acetaminophen (PERCOCET) 10-325 MG tablet   carisoprodol (SOMA) 350 MG tablet    Other Visit Diagnoses    Uncomplicated opioid dependence ([facility])       Relevant Medications   oxyCODONE-acetaminophen (PERCOCET) 10-325 MG tablet   oxyCODONE-acetaminophen (PERCOCET) 10-325 MG tablet   oxyCODONE-acetaminophen (PERCOCET) 10-325 MG tablet   carisoprodol (SOMA) 350 MG tablet       Follow Up Instructions:  Follow up in 1 months   I discussed the assessment and treatment plan with the patient. The patient was provided an opportunity to ask questions and all were answered. The patient agreed with the plan and demonstrated an understanding of the instructions.   The patient was advised to call back or seek an in-person evaluation if the symptoms worsen or if the condition fails to improve as anticipated.  The above assessment and management plan was discussed with the patient. The patient verbalized understanding of and has agreed to the management plan. Patient is aware to call the clinic if symptoms persist or worsen. Patient is aware when to return to the clinic for a follow-up visit. Patient educated on when it is appropriate to go to the emergency department.    I provided 13 minutes of non-face-to-face time during this encounter.    Nils PyleJoshua A Dettinger, MD

## 2019-08-11 ENCOUNTER — Telehealth: Payer: Self-pay | Admitting: Family Medicine

## 2019-08-11 ENCOUNTER — Other Ambulatory Visit: Payer: Self-pay

## 2019-08-12 ENCOUNTER — Other Ambulatory Visit: Payer: Self-pay | Admitting: Internal Medicine

## 2019-08-12 ENCOUNTER — Ambulatory Visit (INDEPENDENT_AMBULATORY_CARE_PROVIDER_SITE_OTHER): Payer: Medicare HMO | Admitting: Family Medicine

## 2019-08-12 ENCOUNTER — Encounter: Payer: Self-pay | Admitting: Family Medicine

## 2019-08-12 VITALS — BP 103/64 | HR 60 | Temp 96.9°F | Ht 73.0 in | Wt 254.2 lb

## 2019-08-12 DIAGNOSIS — R739 Hyperglycemia, unspecified: Secondary | ICD-10-CM | POA: Diagnosis not present

## 2019-08-12 DIAGNOSIS — M51369 Other intervertebral disc degeneration, lumbar region without mention of lumbar back pain or lower extremity pain: Secondary | ICD-10-CM

## 2019-08-12 DIAGNOSIS — I10 Essential (primary) hypertension: Secondary | ICD-10-CM | POA: Diagnosis not present

## 2019-08-12 DIAGNOSIS — J449 Chronic obstructive pulmonary disease, unspecified: Secondary | ICD-10-CM

## 2019-08-12 DIAGNOSIS — H9190 Unspecified hearing loss, unspecified ear: Secondary | ICD-10-CM | POA: Diagnosis not present

## 2019-08-12 DIAGNOSIS — F1120 Opioid dependence, uncomplicated: Secondary | ICD-10-CM

## 2019-08-12 DIAGNOSIS — M79673 Pain in unspecified foot: Secondary | ICD-10-CM

## 2019-08-12 DIAGNOSIS — W57XXXA Bitten or stung by nonvenomous insect and other nonvenomous arthropods, initial encounter: Secondary | ICD-10-CM | POA: Diagnosis not present

## 2019-08-12 DIAGNOSIS — K219 Gastro-esophageal reflux disease without esophagitis: Secondary | ICD-10-CM

## 2019-08-12 DIAGNOSIS — E7800 Pure hypercholesterolemia, unspecified: Secondary | ICD-10-CM | POA: Diagnosis not present

## 2019-08-12 DIAGNOSIS — M5136 Other intervertebral disc degeneration, lumbar region: Secondary | ICD-10-CM | POA: Diagnosis not present

## 2019-08-12 LAB — BAYER DCA HB A1C WAIVED: HB A1C (BAYER DCA - WAIVED): 5.7 % (ref <7.0)

## 2019-08-12 MED ORDER — DOXYCYCLINE HYCLATE 100 MG PO TABS: 100.0000 mg | ORAL_TABLET | Freq: Two times a day (BID) | ORAL | 0 refills | Status: DC

## 2019-08-12 MED ORDER — SILDENAFIL CITRATE 20 MG PO TABS: 20.0000 mg | ORAL_TABLET | Freq: Every day | ORAL | 2 refills | Status: AC | PRN

## 2019-08-12 MED ORDER — OXYCODONE-ACETAMINOPHEN 10-325 MG PO TABS: 1.0000 | ORAL_TABLET | Freq: Three times a day (TID) | ORAL | 0 refills | Status: DC | PRN

## 2019-08-12 MED ORDER — MUPIROCIN CALCIUM 2 % EX CREA: 1.0000 "application " | TOPICAL_CREAM | Freq: Two times a day (BID) | CUTANEOUS | 3 refills | Status: AC

## 2019-08-12 NOTE — Progress Notes (Signed)
BP 103/64   Pulse 60   Temp (!) 96.9 F (36.1 C) (Temporal)   Ht 6' 1"  (1.854 m)   Wt 254 lb 3.2 oz (115.3 kg)   BMI 33.54 kg/m    Subjective:   Patient ID: Brian Harrington, male    DOB: 1946/04/09, 73 y.o.   MRN: [id number]  HPI: Brian Harrington is a 73 y.o. male presenting on 08/12/2019 for Hypertension (4 month follow up)   HPI Hypertension Patient is currently on metoprolol, and their blood pressure today is 103/64. Patient denies any lightheadedness or dizziness. Patient denies headaches, blurred vision, chest pains, shortness of breath, or weakness. Denies any side effects from medication and is content with current medication.   COPD Patient is coming in for COPD recheck today.  He is currently on Combivent and Symbicort and albuterol.  He has a mild chronic cough but denies any major coughing spells or wheezing spells.  He has 1nighttime symptoms per week and 3daytime symptoms per week currently.   She has multiple tick bites of her legs lower abdomen and torso.  He says he is sustained them over the past few weeks and has removed multiple some very small tics.  Pain assessment: Cause of pain-degenerative disc disease of back Pain location-lower back and sometimes into legs Pain on scale of 1-10- 8 Frequency-Daily What increases pain-being on feet for long periods of time What makes pain Better-medication and stretching Effects on ADL - minimal limits Any change in general medical condition-multiple tick bites  Current opioids rx-cassette 10-3 25 3  times daily. # meds rx- 70 Effectiveness of current meds-works well Adverse reactions form pain meds-none Morphine equivalent- 45  Pill count performed-No Last drug screen -today ( high risk q37m moderate risk q620mlow risk yearly ) Urine drug screen today- Yes [place]  If yes were their any concerning findings? -None  No flowsheet data found.   Pain contract signed on: 08/12/2019  Relevant past  medical, surgical, family and social history reviewed and updated as indicated. Interim medical history since our last visit reviewed. Allergies and medications reviewed and updated.  Review of Systems  Constitutional: Negative for chills and fever.  Respiratory: Negative for shortness of breath and wheezing.   Cardiovascular: Negative for chest pain and leg swelling.  Musculoskeletal: Negative for back pain and gait problem.  Skin: Negative for rash.  Neurological: Negative for dizziness, weakness and light-headedness.  All other systems reviewed and are negative.   Per HPI unless specifically indicated above   Allergies as of 08/12/2019      Reactions   Amlodipine Besylate Shortness Of Breath, Swelling   REACTION: tongue and facial swelling, difficulty breathing   Sulfa Antibiotics Nausea And Vomiting   Sulfonamide Derivatives Nausea And Vomiting      Medication List       Accurate as of August 12, 2019  2:45 PM. If you have any questions, ask your nurse or doctor.        albuterol (2.5 MG/3ML) 0.083% nebulizer solution Commonly known as: PROVENTIL NEBULIZE 1 VIAL EVERY 6 HOURS AS NEEDED   budesonide-formoterol 80-4.5 MCG/ACT inhaler Commonly known as: Symbicort Take 2 puffs first thing in am and then another 2 puffs about 12 hours later.   carisoprodol 350 MG tablet Commonly known as: SOMA Take 1 tablet (350 mg total) by mouth 3 (three) times daily as needed. for muscle spams   doxycycline 100 MG tablet Commonly known as: VIBRA-TABS Take 1  tablet (100 mg total) by mouth 2 (two) times daily. 1 po bid Started by: Worthy Rancher, MD   famotidine 20 MG tablet Commonly known as: Pepcid One after breakfast   fluticasone 50 MCG/ACT nasal spray Commonly known as: FLONASE Place 1 spray into both nostrils 2 (two) times daily as needed for allergies or rhinitis.   Ipratropium-Albuterol 20-100 MCG/ACT Aers respimat Commonly known as: Combivent Respimat On puff  every 4 hours as needed   loratadine 10 MG tablet Commonly known as: CLARITIN Take 1 tablet (10 mg total) by mouth daily.   metoprolol tartrate 25 MG tablet Commonly known as: LOPRESSOR Take 1 tablet (25 mg total) by mouth 2 (two) times daily.   mupirocin cream 2 % Commonly known as: Bactroban Apply 1 application topically 2 (two) times daily. Started by: Worthy Rancher, MD   oxyCODONE-acetaminophen 10-325 MG tablet Commonly known as: PERCOCET Take 1 tablet by mouth every 8 (eight) hours as needed for pain.   oxyCODONE-acetaminophen 10-325 MG tablet Commonly known as: PERCOCET Take 1 tablet by mouth every 8 (eight) hours as needed for pain. Do not refill for 30 days from prescription date   oxyCODONE-acetaminophen 10-325 MG tablet Commonly known as: PERCOCET Take 1 tablet by mouth every 8 (eight) hours as needed for pain. Do not refill until 60 days from prescription date   OXYGEN Inhale 2 L into the lungs continuous. 2 liters as needed   pantoprazole 40 MG tablet Commonly known as: PROTONIX TAKE ONE (1) TABLET EACH DAY   sildenafil 20 MG tablet Commonly known as: REVATIO TAKE ONE TABLET BY MOUTH DAILY AS NEEDED        Objective:   BP 103/64   Pulse 60   Temp (!) 96.9 F (36.1 C) (Temporal)   Ht 6' 1"  (1.854 m)   Wt 254 lb 3.2 oz (115.3 kg)   BMI 33.54 kg/m   Wt Readings from Last 3 Encounters:  08/12/19 254 lb 3.2 oz (115.3 kg)  05/19/19 269 lb 3.2 oz (122.1 kg)  11/02/18 245 lb (111.1 kg)    Physical Exam Vitals signs and nursing note reviewed.  Constitutional:      General: He is not in acute distress.    Appearance: He is well-developed. He is not diaphoretic.  Eyes:     General: No scleral icterus.    Conjunctiva/sclera: Conjunctivae normal.  Neck:     Musculoskeletal: Neck supple.     Thyroid: No thyromegaly.  Cardiovascular:     Rate and Rhythm: Normal rate and regular rhythm.     Heart sounds: Normal heart sounds. No murmur.   Pulmonary:     Effort: Pulmonary effort is normal. No respiratory distress.     Breath sounds: Normal breath sounds. No wheezing.  Musculoskeletal: Normal range of motion.  Lymphadenopathy:     Cervical: No cervical adenopathy.  Skin:    General: Skin is warm and dry.     Findings: No rash.  Neurological:     Mental Status: He is alert and oriented to person, place, and time.     Coordination: Coordination normal.  Psychiatric:        Behavior: Behavior normal.       Assessment & Plan:   Problem List Items Addressed This Visit      Cardiovascular and Mediastinum   HYPERTENSION, BENIGN   Relevant Orders   CMP14+EGFR     Respiratory   COPD  GOLD  0      Digestive  GERD (gastroesophageal reflux disease)   Relevant Orders   CBC with Differential/Platelet     Musculoskeletal and Integument   Degenerative disc disease, lumbar   Relevant Medications   oxyCODONE-acetaminophen (PERCOCET) 10-325 MG tablet   oxyCODONE-acetaminophen (PERCOCET) 10-325 MG tablet   oxyCODONE-acetaminophen (PERCOCET) 10-325 MG tablet     Other   Hypercholesterolemia - Primary   Relevant Orders   Bayer DCA Hb A1c Waived   Lipid panel    Other Visit Diagnoses    Uncomplicated opioid dependence ([place])       Relevant Medications   oxyCODONE-acetaminophen (PERCOCET) 10-325 MG tablet   oxyCODONE-acetaminophen (PERCOCET) 10-325 MG tablet   oxyCODONE-acetaminophen (PERCOCET) 10-325 MG tablet   Tick bite, initial encounter       multiple over legs and abdomen   Relevant Medications   doxycycline (VIBRA-TABS) 100 MG tablet      Will give doxycycline for tick bites and a small area of cellulitis on left upper inner thigh, continue pain medicationAnd continue other current medication.  Follow-up in 3 months Follow up plan: Return in about 3 months (around 11/12/2019), or if symptoms worsen or fail to improve, for Pain medication and diabetes recheck.  Counseling provided for all of the vaccine  components No orders of the defined types were placed in this encounter.   Caryl Pina, MD [facility] 08/12/2019, 2:45 PM

## 2019-08-12 NOTE — Addendum Note (Signed)
Addended by: Caryl Pina on: 08/12/2019 02:50 PM   Modules accepted: Orders

## 2019-08-12 NOTE — Addendum Note (Signed)
Addended by: Nigel Berthold C on: 08/12/2019 02:52 PM   Modules accepted: Orders

## 2019-08-12 NOTE — Addendum Note (Signed)
Addended by: Caryl Pina on: 08/12/2019 02:47 PM   Modules accepted: Orders

## 2019-08-13 LAB — CBC WITH DIFFERENTIAL/PLATELET
Basophils Absolute: 0 10*3/uL (ref 0.0–0.2)
Basos: 1 %
EOS (ABSOLUTE): 0.3 10*3/uL (ref 0.0–0.4)
Eos: 5 %
Hematocrit: 39.4 % (ref 37.5–51.0)
Hemoglobin: 13.3 g/dL (ref 13.0–17.7)
Immature Grans (Abs): 0 10*3/uL (ref 0.0–0.1)
Immature Granulocytes: 0 %
Lymphocytes Absolute: 1.8 10*3/uL (ref 0.7–3.1)
Lymphs: 31 %
MCH: 25.8 pg — ABNORMAL LOW (ref 26.6–33.0)
MCHC: 33.8 g/dL (ref 31.5–35.7)
MCV: 77 fL — ABNORMAL LOW (ref 79–97)
Monocytes Absolute: 0.5 10*3/uL (ref 0.1–0.9)
Monocytes: 9 %
Neutrophils Absolute: 3.1 10*3/uL (ref 1.4–7.0)
Neutrophils: 54 %
Platelets: 245 10*3/uL (ref 150–450)
RBC: 5.15 x10E6/uL (ref 4.14–5.80)
RDW: 12.9 % (ref 11.6–15.4)
WBC: 5.6 10*3/uL (ref 3.4–10.8)

## 2019-08-13 LAB — CMP14+EGFR
ALT: 26 IU/L (ref 0–44)
AST: 30 IU/L (ref 0–40)
Albumin/Globulin Ratio: 1.7 (ref 1.2–2.2)
Albumin: 4.2 g/dL (ref 3.8–4.8)
Alkaline Phosphatase: 87 IU/L (ref 39–117)
BUN/Creatinine Ratio: 16 (ref 10–24)
BUN: 18 mg/dL (ref 8–27)
Bilirubin Total: 0.3 mg/dL (ref 0.0–1.2)
CO2: 24 mmol/L (ref 20–29)
Calcium: 8.9 mg/dL (ref 8.6–10.2)
Chloride: 102 mmol/L (ref 96–106)
Creatinine, Ser: 1.1 mg/dL (ref 0.76–1.27)
GFR calc Af Amer: 78 mL/min/{1.73_m2} (ref >59)
GFR calc non Af Amer: 68 mL/min/{1.73_m2} (ref >59)
Globulin, Total: 2.5 g/dL (ref 1.5–4.5)
Glucose: 100 mg/dL — ABNORMAL HIGH (ref 65–99)
Potassium: 4.4 mmol/L (ref 3.5–5.2)
Sodium: 138 mmol/L (ref 134–144)
Total Protein: 6.7 g/dL (ref 6.0–8.5)

## 2019-08-13 LAB — LIPID PANEL
Chol/HDL Ratio: 4.6 ratio (ref 0.0–5.0)
Cholesterol, Total: 161 mg/dL (ref 100–199)
HDL: 35 mg/dL — ABNORMAL LOW (ref >39)
LDL Calculated: 101 mg/dL — ABNORMAL HIGH (ref 0–99)
Triglycerides: 127 mg/dL (ref 0–149)
VLDL Cholesterol Cal: 25 mg/dL (ref 5–40)

## 2019-08-15 LAB — TOXASSURE SELECT 13 (MW), URINE

## 2019-08-16 ENCOUNTER — Encounter: Payer: Self-pay | Admitting: Nurse Practitioner

## 2019-08-16 ENCOUNTER — Ambulatory Visit (INDEPENDENT_AMBULATORY_CARE_PROVIDER_SITE_OTHER): Payer: Medicare HMO | Admitting: Nurse Practitioner

## 2019-08-16 DIAGNOSIS — B86 Scabies: Secondary | ICD-10-CM | POA: Diagnosis not present

## 2019-08-16 DIAGNOSIS — R21 Rash and other nonspecific skin eruption: Secondary | ICD-10-CM

## 2019-08-16 MED ORDER — PREDNISONE 20 MG PO TABS: ORAL_TABLET | ORAL | 0 refills | Status: DC

## 2019-08-16 MED ORDER — PERMETHRIN 5 % EX CREA: TOPICAL_CREAM | CUTANEOUS | 0 refills | Status: DC

## 2019-08-16 NOTE — Progress Notes (Signed)
   Virtual Visit via telephone Note Due to COVID-19 pandemic this visit was conducted virtually. This visit type was conducted due to national recommendations for restrictions regarding the COVID-19 Pandemic (e.g. social distancing, sheltering in place) in an effort to limit this patient's exposure and mitigate transmission in our community. All issues noted in this document were discussed and addressed.  A physical exam was not performed with this format.  I connected with Brian Harrington on 08/16/19 at 10:40 by telephone and verified that I am speaking with the correct person using two identifiers. Brian Harrington is currently located at home and no one is currently with her during visit. The provider, Mary-Margaret Hassell Done, FNP is located in their office at time of visit.  I discussed the limitations, risks, security and privacy concerns of performing an evaluation and management service by telephone and the availability of in person appointments. I also discussed with the patient that there may be a patient responsible charge related to this service. The patient expressed understanding and agreed to proceed.   History and Present Illness:  Patient was given a televisit appointment to discus a rash.  He saw Dr. Warrick Parisian 2 days ago and he thought was tick bites. He says that rash is up and down legs , on back and under arms. They are little bumps that itch. He used gasoline on them yesterday which made them some better.   Review of Systems  Constitutional: Negative for diaphoresis and weight loss.  Eyes: Negative for blurred vision, double vision and pain.  Respiratory: Negative for shortness of breath.   Cardiovascular: Negative for chest pain, palpitations, orthopnea and leg swelling.  Gastrointestinal: Negative for abdominal pain.  Skin: Negative for rash.  Neurological: Negative for dizziness, sensory change, loss of consciousness, weakness and headaches.  Endo/Heme/Allergies: Negative for  polydipsia. Does not bruise/bleed easily.  Psychiatric/Behavioral: Negative for memory loss. The patient does not have insomnia.   All other systems reviewed and are negative.    Observations/Objective: Alert and oriented Rash- patient describes as raised erythematous pumps with clear liquid in center. - slightly sore to touch. Not between fingers and toes.  Assessment and Plan: Brian Harrington in today with chief complaint of Rash   1. Scabies - permethrin (ELIMITE) 5 % cream; Apply from neck down and leave on over night then rinse  Dispense: 60 g; Refill: 0  2. Rash - predniSONE (DELTASONE) 20 MG tablet; 2 po at sametime daily for 5 days  Dispense: 10 tablet; Refill: 0  Avoid scratching   Follow Up Instructions: prn    I discussed the assessment and treatment plan with the patient. The patient was provided an opportunity to ask questions and all were answered. The patient agreed with the plan and demonstrated an understanding of the instructions.   The patient was advised to call back or seek an in-person evaluation if the symptoms worsen or if the condition fails to improve as anticipated.  The above assessment and management plan was discussed with the patient. The patient verbalized understanding of and has agreed to the management plan. Patient is aware to call the clinic if symptoms persist or worsen. Patient is aware when to return to the clinic for a follow-up visit. Patient educated on when it is appropriate to go to the emergency department.   Time call ended:  10:55  I provided 10 minutes of non-face-to-face time during this encounter.    Mary-Margaret Hassell Done, FNP

## 2019-08-19 ENCOUNTER — Ambulatory Visit: Payer: Medicare HMO | Admitting: Internal Medicine

## 2019-08-19 ENCOUNTER — Encounter: Payer: Self-pay | Admitting: Internal Medicine

## 2019-08-19 ENCOUNTER — Ambulatory Visit (INDEPENDENT_AMBULATORY_CARE_PROVIDER_SITE_OTHER): Payer: Medicare HMO | Admitting: Internal Medicine

## 2019-08-19 ENCOUNTER — Other Ambulatory Visit: Payer: Self-pay

## 2019-08-19 DIAGNOSIS — J449 Chronic obstructive pulmonary disease, unspecified: Secondary | ICD-10-CM | POA: Diagnosis not present

## 2019-08-19 DIAGNOSIS — R058 Other specified cough: Secondary | ICD-10-CM

## 2019-08-19 DIAGNOSIS — R05 Cough: Secondary | ICD-10-CM | POA: Diagnosis not present

## 2019-08-19 DIAGNOSIS — R0609 Other forms of dyspnea: Secondary | ICD-10-CM

## 2019-08-19 NOTE — Progress Notes (Signed)
Brian Harrington, male    DOB: [birth year]     MRN: [id number]    Brief patient profile:  22 yowm quit smoking 1992  With some doe at that point  improved but since  around 2015 gradually worse sob/cough > [facility] dx copd rx saba plus another inhaler "something else"  helped some then placed on 02 around 2016 at 2lpm  referred to pulmonary clinic 10/01/2018 by Dr   Dettinger for refractory cough / sob  / sarcoid but no evidence of copd at initial eval     History of Present Illness  10/01/2018 Pulmonary / new pt eval / Brian Harrington   Re copd / thoroughly confused with meds  Chief Complaint  Patient presents with  . Pulmonary Consult    Referred by Dr. 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 Pt c/o cough "since the weather got hot"- at times he produces large amounts of yellow sputum.    Dyspnea:  avg day can do 2 aisles at [employer] on 02  Cough: more than usual worse p arising /worse p incruse and hot weather > mostly beige color x sev tbsp per day Sleep: flat bed 30 degrees hob using pillows  SABA use: combivent/neb variably better p use, not really using neb often "just with colds' Taking symbicort 160 x 2  First thing in am then Incruse lunchtime  Some better with pred in past and just got another rx but hasn't started  rec Plan A = Automatic = Symbicort 80 Take 2 puffs first thing in am and then another 2 puffs about 12 hours later.  Stop Incruse  Plan B = Backup Only use your combivent  as a rescue medication   Plan C = Crisis - only use your albuterol nebulizer if you first try Plan B and it fails to help > ok to use the nebulizer up to every 4 hours but if start needing it regularly call for immediate appointment Augmentin 875 mg take one pill twice daily  X 10 days   Prednisone take it per Dr D Pepcid 20 mg after breakfast and continue protonix before supper  GERD  Diet  Please remember to go to the lab and x-ray department downstairs in the basement  for your tests - we will call you with the  results when they are available.  Please schedule a follow up office visit in 4 weeks, call sooner if needed with all medications /inhalers/ solutions in hand so we can verify exactly what you are taking     11/02/2018  Extended summary f/u ov/Brian Harrington re:  Sob ? All upper airway / did not bring any meds  Chief Complaint  Patient presents with  . Follow-up    PFT's done today. Breathing is about the same. He is coughing less- non prod. He is using his combivent inhaler 2-3 x per day. He is using his neb about once per wk.   Dyspnea:  MMRC3 = can't walk 100 yards even at a slow pace at a flat grade s stopping due to sob   Cough: much better unless around perfume/  Sleeping:  Bed flat / wedge pillow o/w overt gerd SABA use: as above 02: none at hs/ sometimes daytime   Last time could tol brisk walk =  several years prior to OV   rec Pantoprazole (protonix) 40 mg   Take  30-60 min before first meal of the day and Pepcid (famotidine)  20 mg one after supper or  prior to bedtime until return to office - this is the best way to tell whether stomach acid is contributing to your problem.   GERD diet  Continue symbicort 80 Take 2 puffs first thing in am and then another 2 puffs about 12 hours later.  Only combivent use the combivent to catch your breath To get the most out of exercise, you need to be continuously aware that you are short of breath, but never out of breath, for 30 minutes daily.    05/19/2019  f/u ov/Brian Harrington re: cough variant asthma/ uacs  Obesity/ deconditioning Chief Complaint  Patient presents with  . Follow-up    He states his breathing continues at his baseline, no new concerns. He needs a new nebulizer supplies.   Dyspnea:  MMRC2 = can't walk a nl pace on a flat grade s sob but does fine slow and flat 3 Cough: none Sleeping: on side falt bed SABA use: none 02: none  rec Plan A = Automatic = symbicort 80 Take 2 puffs first thing in am and then another 2 puffs about 12 hours  later.  Work on inhaler technique:  Plan B = Backup Only use your combivent  inhaler as a rescue medication Please schedule a follow up visit in 3 months but call sooner if needed  with all medications /inhalers/ solutions in hand so we can verify exactly what you are taking. This includes all medications from all doctors and over the counters   Virtual Visit via Telephone Note 08/19/2019   I connected with Brian Harrington on 08/19/19 at 1:15  PM EDT by telephone and verified that I am speaking with the correct person using two identifiers.   I discussed the limitations, risks, security and privacy concerns of performing an evaluation and management service by telephone and the availability of in person appointments. I also discussed with the patient that there may be a patient responsible charge related to this service. The patient expressed understanding and agreed to proceed.   History of Present Illness: Dyspnea:  "comes and goes" s pattern, sometimes doe x across the room but always doe x walking slow at [employer] [facility] can't walk 100 yards even at a slow pace at a flat grade s stopping due to sob   Cough:  Varies daytime only,min prod mucoid  Sleeping: on side bed flat  SABA use: combivent 2-3 x  02: no  Has gained about 10 lb since the ov 11/ 5/19 where he did so well with walking study in office  No obvious day to day or daytime variability or assoc excess/ purulent sputum or mucus plugs or hemoptysis or cp or chest tightness, subjective wheeze or overt sinus or hb symptoms.    Also denies any obvious fluctuation of symptoms with weather or environmental changes or other aggravating or alleviating factors except as outlined above.   Meds reviewed/ med reconciliation completed         Observations/Objective: Seems sob getting to phone "had to walk into the house up an incline" / speaking in full sentences Says same as he was prior and thinks the main problem is walking up hill  or in heat    Assessment and Plan: See problem list for active a/p's   Follow Up Instructions: See avs for instructions unique to this ov which includes revised/ updated med list     I discussed the assessment and treatment plan with the patient. The patient was provided an opportunity to  ask questions and all were answered. The patient agreed with the plan and demonstrated an understanding of the instructions.   The patient was advised to call back or seek an in-person evaluation if the symptoms worsen or if the condition fails to improve as anticipated.  I provided 25 minutes of non-face-to-face time during this encounter.   Sandrea HughsMichael Keishon Chavarin, MD

## 2019-08-19 NOTE — Patient Instructions (Signed)
Use combivent one puff 15 min before activity to see what difference it makes  Pt has my chart and needs appt next available slot with all meds/ inhalers in hand

## 2019-08-19 NOTE — Assessment & Plan Note (Addendum)
11/02/2018  Walked RA x 3 laps @ 185 ft each stopped due to  End of study, nl pace, sat 96% / min sob     Strongly suspect deconditioning with wt gain  here but need to repeat the walking study, cxr and labs to know for sure and can set up cpst if dx remains in doubt.   Each maintenance medication was reviewed in detail including most importantly the difference between maintenance and as needed and under what circumstances the prns are to be used.  Please see AVS for specific  Instructions which are unique to this visit and I personally typed out  which were reviewed in detail over the phone with the patient and a copy provided via MyChart

## 2019-08-19 NOTE — Assessment & Plan Note (Signed)
Allergy profile 10/01/2018 >  Eos 0.4/  IgE 182  RAST pos dust only    - max rx for gerd 10/01/2018    Much better overall but not the doe

## 2019-08-19 NOTE — Assessment & Plan Note (Signed)
Quit smoking 1992  Spirometry 10/01/2018  FEV1 1.9 (52%)  Ratio 83 s curvature off all rx   - 10/01/2018  After extensive coaching inhaler device,  effectiveness =    75% with hfa > reduced symbicort to 80 2bid as cough is the main concern - [facility] AT screen  10/01/18  MM/ level 185  - PFT's  11/02/2018  FEV1 2.46 (77 % ) ratio 78  p 4 % improvement from saba p nothing prior to study with DLCO  67/70c % corrects to 89 % for alv volume   - 05/19/2019  After extensive coaching inhaler device,  effectiveness =    75% > continue symb 80 2bid   Clearly this variable doe is not copd and may not even be related to airways dz based on last walking sats on day of pft with feve1 2.26   Rec ov asap with all meds in hand using a trust but verify approach to confirm accurate Medication  Reconciliation The principal here is that until we are certain that the  patients are doing what we've asked, it makes no sense to ask them to do more.

## 2019-08-29 ENCOUNTER — Telehealth: Payer: Self-pay | Admitting: Family Medicine

## 2019-08-30 ENCOUNTER — Ambulatory Visit (INDEPENDENT_AMBULATORY_CARE_PROVIDER_SITE_OTHER): Payer: Medicare HMO | Admitting: *Deleted

## 2019-08-30 DIAGNOSIS — Z1211 Encounter for screening for malignant neoplasm of colon: Secondary | ICD-10-CM

## 2019-08-30 DIAGNOSIS — Z1212 Encounter for screening for malignant neoplasm of rectum: Secondary | ICD-10-CM

## 2019-08-30 DIAGNOSIS — Z0000 Encounter for general adult medical examination without abnormal findings: Secondary | ICD-10-CM | POA: Diagnosis not present

## 2019-08-30 NOTE — Progress Notes (Addendum)
MEDICARE ANNUAL WELLNESS VISIT  08/30/2019  Telephone Visit Disclaimer This Medicare AWV was conducted by telephone due to national recommendations for restrictions regarding the COVID-19 Pandemic (e.g. social distancing).  I verified, using two identifiers, that I am speaking with Brian Harrington or their authorized healthcare agent. I discussed the limitations, risks, security, and privacy concerns of performing an evaluation and management service by telephone and the potential availability of an in-person appointment in the future. The patient expressed understanding and agreed to proceed.   Subjective:  Brian Harrington is a 73 y.o. male patient of Dettinger, Fransisca Kaufmann, MD who had a Medicare Annual Wellness Visit today via telephone. Brian Harrington is Retired and lives alone. he has 3 children. he reports that he is socially active and does interact with friends/family regularly. he is minimally physically active and enjoys "chasing women".  Patient Care Team: Dettinger, Fransisca Kaufmann, MD as PCP - General (Family Medicine) stone, nancy as Consulting Physician (Family Medicine)  Advanced Directives 08/30/2019 11/20/2017 11/20/2017 09/07/2017 07/13/2017 07/13/2017 07/20/2015  Does Patient Have a Medical Advance Directive? No No No No No;Yes No No  Type of Advance Directive - - - - Press photographer;Living will - -  Does patient want to make changes to medical advance directive? - - - - No - Patient declined - -  Copy of [place] in Chart? - - - - No - copy requested - -  Would patient like information on creating a medical advance directive? No - Patient declined No - Patient declined - Yes (MAU/Ambulatory/Procedural Areas - Information given);No - Patient declined No - Patient declined - No - patient declined information    Hospital Utilization Over the Past 12 Months: # of hospitalizations or ER visits: 0 # of surgeries: 0  Review of Systems    Patient reports that his  overall health is better compared to last year.  History obtained from chart review  Patient Reported Readings (BP, Pulse, CBG, Weight, etc) none  Pain Assessment Pain : 0-10 Pain Score: 7  Pain Type: Chronic pain Pain Location: Back Pain Orientation: Lower Pain Descriptors / Indicators: Aching, Burning, Shooting Pain Onset: Other (comment) Pain Frequency: Constant Pain Relieving Factors: pain medication Effect of Pain on Daily Activities: depends on the day  Pain Relieving Factors: pain medication  Current Medications & Allergies (verified) Allergies as of 08/30/2019      Reactions   Amlodipine Besylate Shortness Of Breath, Swelling   REACTION: tongue and facial swelling, difficulty breathing   Sulfa Antibiotics Nausea And Vomiting   Sulfonamide Derivatives Nausea And Vomiting      Medication List       Accurate as of August 30, 2019 10:09 AM. If you have any questions, ask your nurse or doctor.        STOP taking these medications   doxycycline 100 MG tablet Commonly known as: VIBRA-TABS   permethrin 5 % cream Commonly known as: ELIMITE   predniSONE 20 MG tablet Commonly known as: Deltasone     TAKE these medications   albuterol (2.5 MG/3ML) 0.083% nebulizer solution Commonly known as: PROVENTIL NEBULIZE 1 VIAL EVERY 6 HOURS AS NEEDED   budesonide-formoterol 80-4.5 MCG/ACT inhaler Commonly known as: Symbicort Take 2 puffs first thing in am and then another 2 puffs about 12 hours later.   carisoprodol 350 MG tablet Commonly known as: SOMA Take 1 tablet (350 mg total) by mouth 3 (three) times daily as needed. for muscle  spams   Combivent Respimat 20-100 MCG/ACT Aers respimat Generic drug: Ipratropium-Albuterol USE 1 PUFF EVERY 4 HOURS AS NEEDED   famotidine 20 MG tablet Commonly known as: Pepcid One after breakfast   fluticasone 50 MCG/ACT nasal spray Commonly known as: FLONASE Place 1 spray into both nostrils 2 (two) times daily as needed for  allergies or rhinitis.   loratadine 10 MG tablet Commonly known as: CLARITIN Take 1 tablet (10 mg total) by mouth daily.   metoprolol tartrate 25 MG tablet Commonly known as: LOPRESSOR Take 1 tablet (25 mg total) by mouth 2 (two) times daily.   mupirocin cream 2 % Commonly known as: Bactroban Apply 1 application topically 2 (two) times daily.   oxyCODONE-acetaminophen 10-325 MG tablet Commonly known as: PERCOCET Take 1 tablet by mouth every 8 (eight) hours as needed for pain.   oxyCODONE-acetaminophen 10-325 MG tablet Commonly known as: PERCOCET Take 1 tablet by mouth every 8 (eight) hours as needed for pain. Do not refill for 30 days from prescription date   oxyCODONE-acetaminophen 10-325 MG tablet Commonly known as: PERCOCET Take 1 tablet by mouth every 8 (eight) hours as needed for pain. Do not refill until 60 days from prescription date   OXYGEN Inhale 2 L into the lungs continuous. 2 liters as needed   pantoprazole 40 MG tablet Commonly known as: PROTONIX TAKE ONE (1) TABLET EACH DAY   sildenafil 20 MG tablet Commonly known as: REVATIO Take 1-3 tablets (20-60 mg total) by mouth daily as needed.       History (reviewed): Past Medical History:  Diagnosis Date  . Arthritis   . Back pain, chronic   . CAD (coronary artery disease)   . COPD (chronic obstructive pulmonary disease) (HCC)    on 2L home O2  . Hypertension   . Sarcoidosis of lung (HCC)    reported by patient but no clear documentation of this condition  . Sleep apnea    no CPAP   Past Surgical History:  Procedure Laterality Date  . BACK SURGERY    . CARDIAC CATHETERIZATION     bare-metal stent to left anterior descending  . HAND SURGERY    . NECK SURGERY  06/23/2017  . Skin grafting left leg     Family History  Problem Relation Age of Onset  . Arthritis Mother   . Thrombocytopenia Mother   . Diabetes Brother   . Colon polyps Brother   . Depression Father   . CAD Neg Hx    Social  History   Socioeconomic History  . Marital status: Divorced    Spouse name: Not on file  . Number of children: 3  . Years of education: 11th grade-then got his GED  . Highest education level: GED or equivalent  Occupational History  . Occupation: Self employed-Farmer  Social Needs  . Financial resource strain: Not hard at all  . Food insecurity    Worry: Never true    Inability: Never true  . Transportation needs    Medical: No    Non-medical: No  Tobacco Use  . Smoking status: Former Smoker    Packs/day: 1.00    Years: 25.00    Pack years: 25.00    Types: Cigarettes    Quit date: 09/23/1991    Years since quitting: 27.9  . Smokeless tobacco: Never Used  Substance and Sexual Activity  . Alcohol use: Yes    Alcohol/week: 1.0 standard drinks    Types: 1 Standard drinks or equivalent per  week  . Drug use: Yes    Types: Marijuana    Comment: Hasn't used recently. Only used for pain management.   . Sexual activity: Yes  Lifestyle  . Physical activity    Days per week: 7 days    Minutes per session: 60 min  . Stress: Only a little  Relationships  . Social connections    Talks on phone: More than three times a week    Gets together: More than three times a week    Attends religious service: Never    Active member of club or organization: No    Attends meetings of clubs or organizations: Never    Relationship status: Divorced  Other Topics Concern  . Not on file  Social History Narrative   [place] in [place]    Activities of Daily Living In your present state of health, do you have any difficulty performing the following activities: 08/30/2019  Hearing? Y  Comment pt has appointment with Audiologist to have his hearing checked  Vision? Y  Comment has cataracts on both eyes  Difficulty concentrating or making decisions? N  Walking or climbing stairs? Y  Comment due to back pain that radiates down leg  Doing errands, shopping? N  Preparing Food and eating ? N   Using the Toilet? N  In the past six months, have you accidently leaked urine? N  Do you have problems with loss of bowel control? N  Managing your Medications? N  Managing your Finances? N  Housekeeping or managing your Housekeeping? N  Some recent data might be hidden    Patient Education/ Literacy How often do you need to have someone help you when you read instructions, pamphlets, or other written materials from your doctor or pharmacy?: 1 - Never What is the last grade level you completed in school?: 11th grade-then got his GED  Exercise Current Exercise Habits: Home exercise routine, Type of exercise: walking, Time (Minutes): 60, Frequency (Times/Week): 7, Weekly Exercise (Minutes/Week): 420, Intensity: Mild, Exercise limited by: orthopedic condition(s);respiratory conditions(s)  Diet Patient reports consuming 3 meals a day and 2 snack(s) a day Patient reports that his primary diet is: Regular Patient reports that she does have regular access to food.   Depression Screen PHQ 2/9 Scores 08/30/2019 08/12/2019 09/08/2018 06/07/2018 03/10/2018 12/09/2017 09/18/2017  PHQ - 2 Score 0 0 1 0 0 0 0     Fall Risk Fall Risk  08/30/2019 09/08/2018 06/07/2018 03/10/2018 12/09/2017  Falls in the past year? 1 No No No No  Number falls in past yr: 0 - - - -  Injury with Fall? 0 - - - -  Comment - - - - -  Follow up Falls prevention discussed - - - -  Comment get rid of all throw rugs in the house, adequate lighting in walkways and grab bars in the bathroom - - - -     Objective:  Brian Harrington seemed alert and oriented and he participated appropriately during our telephone visit.  Blood Pressure Weight BMI  BP Readings from Last 3 Encounters:  08/12/19 103/64  05/19/19 118/74  11/02/18 128/74   Wt Readings from Last 3 Encounters:  08/12/19 254 lb 3.2 oz (115.3 kg)  05/19/19 269 lb 3.2 oz (122.1 kg)  11/02/18 245 lb (111.1 kg)   BMI Readings from Last 1 Encounters:  08/12/19 33.54 kg/m     *Unable to obtain current vital signs, weight, and BMI due to telephone visit type  Hearing/Vision  . Brian FearingJames did not seem to have difficulty with hearing/understanding during the telephone conversation . Reports that he has not had a formal eye exam by an eye care professional within the past year . Reports that he has not had a formal hearing evaluation within the past year *Unable to fully assess hearing and vision during telephone visit type  Cognitive Function: 6CIT Screen 08/30/2019  What Year? 0 points  What month? 0 points  What time? 0 points  Count back from 20 0 points  Months in reverse 0 points  Repeat phrase 0 points  Total Score 0   (Normal:0-7, Significant for Dysfunction: >8)  Normal Cognitive Function Screening: Yes   Immunization & Health Maintenance Record Immunization History  Administered Date(s) Administered  . Influenza, High Dose Seasonal PF 09/07/2017  . Influenza,inj,Quad PF,6+ Mos 09/16/2016, 11/02/2018  . Pneumococcal Conjugate-13 08/17/2017  . Pneumococcal Polysaccharide-23 04/25/2011, 12/23/2013  . Tdap 08/17/2017  . Zoster Recombinat (Shingrix) 09/07/2017    Health Maintenance  Topic Date Due  . COLONOSCOPY  11/03/1998  . INFLUENZA VACCINE  07/30/2019  . TETANUS/TDAP  08/18/2027  . Hepatitis C Screening  Completed  . PNA vac Low Risk Adult  Completed       Assessment  This is a routine wellness examination for Brian Harrington.  Health Maintenance: Due or Overdue Health Maintenance Due  Topic Date Due  . COLONOSCOPY  11/03/1998  . INFLUENZA VACCINE  07/30/2019    Brian Harrington does not need a referral for Community Assistance: Care Management:   no Social Work:    no Prescription Assistance:  no Nutrition/Diabetes Education:  no   Plan:  Personalized Goals Goals Addressed            This Visit's Progress   . DIET - INCREASE WATER INTAKE       Try to drink 6-8 glasses of water daily.      Personalized Health  Maintenance & Screening Recommendations  Influenza vaccine Colorectal cancer screening  Lung Cancer Screening Recommended: no (Low Dose CT Chest recommended if Age 50-80 years, 30 pack-year currently smoking OR have quit w/in past 15 years) Hepatitis C Screening recommended: no HIV Screening recommended: no  Advanced Directives: Written information was not prepared per patient's request.  Referrals & Orders No orders of the defined types were placed in this encounter.   Follow-up Plan . Follow-up with Dettinger, Elige RadonJoshua A, MD as planned . Schedule your Colonoscopy as discussed . Get your Flu shot at your next visit with your PCP   I have personally reviewed and noted the following in the patient's chart:   . Medical and social history . Use of alcohol, tobacco or illicit drugs  . Current medications and supplements . Functional ability and status . Nutritional status . Physical activity . Advanced directives . List of other physicians . Hospitalizations, surgeries, and ER visits in previous 12 months . Vitals . Screenings to include cognitive, depression, and falls . Referrals and appointments  In addition, I have reviewed and discussed with Brian Harrington certain preventive protocols, quality metrics, and best practice recommendations. A written personalized care plan for preventive services as well as general preventive health recommendations is available and can be mailed to the patient at his request.      Margurite AuerbachCompton, Karla G, LPN  4/0/98119/12/2018    I have reviewed and agree with the above AWV documentation.   Jannifer Rodneyhristy Hawks, FNP

## 2019-08-30 NOTE — Patient Instructions (Signed)
Preventive Care 75 Years and Older, Male Preventive care refers to lifestyle choices and visits with your health care provider that can promote health and wellness. This includes:  A yearly physical exam. This is also called an annual well check.  Regular dental and eye exams.  Immunizations.  Screening for certain conditions.  Healthy lifestyle choices, such as diet and exercise. What can I expect for my preventive care visit? Physical exam Your health care provider will check:  Height and weight. These may be used to calculate body mass index (BMI), which is a measurement that tells if you are at a healthy weight.  Heart rate and blood pressure.  Your skin for abnormal spots. Counseling Your health care provider may ask you questions about:  Alcohol, tobacco, and drug use.  Emotional well-being.  Home and relationship well-being.  Sexual activity.  Eating habits.  History of falls.  Memory and ability to understand (cognition).  Work and work Statistician. What immunizations do I need?  Influenza (flu) vaccine  This is recommended every year. Tetanus, diphtheria, and pertussis (Tdap) vaccine  You may need a Td booster every 10 years. Varicella (chickenpox) vaccine  You may need this vaccine if you have not already been vaccinated. Zoster (shingles) vaccine  You may need this after age 50. Pneumococcal conjugate (PCV13) vaccine  One dose is recommended after age 24. Pneumococcal polysaccharide (PPSV23) vaccine  One dose is recommended after age 33. Measles, mumps, and rubella (MMR) vaccine  You may need at least one dose of MMR if you were born in 1957 or later. You may also need a second dose. Meningococcal conjugate (MenACWY) vaccine  You may need this if you have certain conditions. Hepatitis A vaccine  You may need this if you have certain conditions or if you travel or work in places where you may be exposed to hepatitis A. Hepatitis B vaccine   You may need this if you have certain conditions or if you travel or work in places where you may be exposed to hepatitis B. Haemophilus influenzae type b (Hib) vaccine  You may need this if you have certain conditions. You may receive vaccines as individual doses or as more than one vaccine together in one shot (combination vaccines). Talk with your health care provider about the risks and benefits of combination vaccines. What tests do I need? Blood tests  Lipid and cholesterol levels. These may be checked every 5 years, or more frequently depending on your overall health.  Hepatitis C test.  Hepatitis B test. Screening  Lung cancer screening. You may have this screening every year starting at age 74 if you have a 30-pack-year history of smoking and currently smoke or have quit within the past 15 years.  Colorectal cancer screening. All adults should have this screening starting at age 57 and continuing until age 54. Your health care provider may recommend screening at age 47 if you are at increased risk. You will have tests every 1-10 years, depending on your results and the type of screening test.  Prostate cancer screening. Recommendations will vary depending on your family history and other risks.  Diabetes screening. This is done by checking your blood sugar (glucose) after you have not eaten for a while (fasting). You may have this done every 1-3 years.  Abdominal aortic aneurysm (AAA) screening. You may need this if you are a current or former smoker.  Sexually transmitted disease (STD) testing. Follow these instructions at home: Eating and drinking  Eat  a diet that includes fresh fruits and vegetables, whole grains, lean protein, and low-fat dairy products. Limit your intake of foods with high amounts of sugar, saturated fats, and salt.  Take vitamin and mineral supplements as recommended by your health care provider.  Do not drink alcohol if your health care provider  tells you not to drink.  If you drink alcohol: ? Limit how much you have to 0-2 drinks a day. ? Be aware of how much alcohol is in your drink. In the [place] one drink equals one 12 oz bottle of beer (355 mL), one 5 oz glass of wine (148 mL), or one 1 oz glass of hard liquor (44 mL). Lifestyle  Take daily care of your teeth and gums.  Stay active. Exercise for at least 30 minutes on 5 or more days each week.  Do not use any products that contain nicotine or tobacco, such as cigarettes, e-cigarettes, and chewing tobacco. If you need help quitting, ask your health care provider.  If you are sexually active, practice safe sex. Use a condom or other form of protection to prevent STIs (sexually transmitted infections).  Talk with your health care provider about taking a low-dose aspirin or statin. What's next?  Visit your health care provider once a year for a well check visit.  Ask your health care provider how often you should have your eyes and teeth checked.  Stay up to date on all vaccines. This information is not intended to replace advice given to you by your health care provider. Make sure you discuss any questions you have with your health care provider. Document Released: 01/11/2016 Document Revised: 12/09/2018 Document Reviewed: 12/09/2018 Elsevier Patient Education  2020 Elsevier Inc.  

## 2019-09-07 ENCOUNTER — Encounter: Payer: Self-pay | Admitting: Gastroenterology

## 2019-09-08 ENCOUNTER — Other Ambulatory Visit: Payer: Self-pay | Admitting: Family Medicine

## 2019-09-08 DIAGNOSIS — M5136 Other intervertebral disc degeneration, lumbar region: Secondary | ICD-10-CM

## 2019-09-08 DIAGNOSIS — F1120 Opioid dependence, uncomplicated: Secondary | ICD-10-CM

## 2019-10-04 ENCOUNTER — Ambulatory Visit: Payer: Medicare HMO | Admitting: Nurse Practitioner

## 2019-10-06 ENCOUNTER — Other Ambulatory Visit: Payer: Self-pay

## 2019-10-07 ENCOUNTER — Ambulatory Visit (INDEPENDENT_AMBULATORY_CARE_PROVIDER_SITE_OTHER): Payer: Medicare HMO | Admitting: Family Medicine

## 2019-10-07 ENCOUNTER — Encounter: Payer: Self-pay | Admitting: Family Medicine

## 2019-10-07 ENCOUNTER — Other Ambulatory Visit: Payer: Self-pay

## 2019-10-07 DIAGNOSIS — M5136 Other intervertebral disc degeneration, lumbar region: Secondary | ICD-10-CM

## 2019-10-07 DIAGNOSIS — F1120 Opioid dependence, uncomplicated: Secondary | ICD-10-CM | POA: Diagnosis not present

## 2019-10-07 DIAGNOSIS — I10 Essential (primary) hypertension: Secondary | ICD-10-CM | POA: Diagnosis not present

## 2019-10-07 DIAGNOSIS — J441 Chronic obstructive pulmonary disease with (acute) exacerbation: Secondary | ICD-10-CM

## 2019-10-07 DIAGNOSIS — Z20822 Contact with and (suspected) exposure to covid-19: Secondary | ICD-10-CM

## 2019-10-07 DIAGNOSIS — Z20828 Contact with and (suspected) exposure to other viral communicable diseases: Secondary | ICD-10-CM | POA: Diagnosis not present

## 2019-10-07 MED ORDER — AZITHROMYCIN 250 MG PO TABS: ORAL_TABLET | ORAL | 0 refills | Status: DC

## 2019-10-07 MED ORDER — ALBUTEROL SULFATE (2.5 MG/3ML) 0.083% IN NEBU: 2.5000 mg | INHALATION_SOLUTION | RESPIRATORY_TRACT | 5 refills | Status: AC | PRN

## 2019-10-07 MED ORDER — PREDNISONE 20 MG PO TABS: ORAL_TABLET | ORAL | 0 refills | Status: DC

## 2019-10-07 MED ORDER — CARISOPRODOL 350 MG PO TABS: 350.0000 mg | ORAL_TABLET | Freq: Three times a day (TID) | ORAL | 0 refills | Status: DC | PRN

## 2019-10-07 NOTE — Progress Notes (Signed)
Virtual Visit via telephone Note  I connected with Brian Harrington on 10/07/19 at [facility] by telephone and verified that I am speaking with the correct person using two identifiers. Brian Harrington is currently located at home and no other people are currently with her during visit. The provider, Fransisca Kaufmann Ellean Firman, MD is located in their office at time of visit.  Call ended at [facility]  I discussed the limitations, risks, security and privacy concerns of performing an evaluation and management service by telephone and the availability of in person appointments. I also discussed with the patient that there may be a patient responsible charge related to this service. The patient expressed understanding and agreed to proceed.   History and Present Illness Patient has been having trouble breathing and wheezing and sneezing and sore throat.  He is hoarse. He has done rawleys Red to help. He does have productive cough.  He is having fevers and chills and body aches. He feels like he has less fevers today. He is having a lot of sinus drainage.  He has been using albuterol multiple times per day.  Patient denies COVID or sick contacts. This all started 3 days ago  Patient is due for a Soma refill.  No diagnosis found.  Outpatient Encounter Medications as of 10/07/2019  Medication Sig  . albuterol (PROVENTIL) (2.5 MG/3ML) 0.083% nebulizer solution NEBULIZE 1 VIAL EVERY 6 HOURS AS NEEDED  . budesonide-formoterol (SYMBICORT) 80-4.5 MCG/ACT inhaler Take 2 puffs first thing in am and then another 2 puffs about 12 hours later.  . carisoprodol (SOMA) 350 MG tablet TAKE ONE TABLET BY MOUTH THREE TIMES DAILY AS NEEDED FOR MUSCLE SPASM  . COMBIVENT RESPIMAT 20-100 MCG/ACT AERS respimat USE 1 PUFF EVERY 4 HOURS AS NEEDED  . famotidine (PEPCID) 20 MG tablet One after breakfast  . fluticasone (FLONASE) 50 MCG/ACT nasal spray Place 1 spray into both nostrils 2 (two) times daily as needed for allergies or rhinitis.  Marland Kitchen  loratadine (CLARITIN) 10 MG tablet Take 1 tablet (10 mg total) by mouth daily.  . metoprolol tartrate (LOPRESSOR) 25 MG tablet Take 1 tablet (25 mg total) by mouth 2 (two) times daily.  . mupirocin cream (BACTROBAN) 2 % Apply 1 application topically 2 (two) times daily.  Marland Kitchen oxyCODONE-acetaminophen (PERCOCET) 10-325 MG tablet Take 1 tablet by mouth every 8 (eight) hours as needed for pain.  Marland Kitchen oxyCODONE-acetaminophen (PERCOCET) 10-325 MG tablet Take 1 tablet by mouth every 8 (eight) hours as needed for pain. Do not refill for 30 days from prescription date  . oxyCODONE-acetaminophen (PERCOCET) 10-325 MG tablet Take 1 tablet by mouth every 8 (eight) hours as needed for pain. Do not refill until 60 days from prescription date  . OXYGEN Inhale 2 L into the lungs continuous. 2 liters as needed   . pantoprazole (PROTONIX) 40 MG tablet TAKE ONE (1) TABLET EACH DAY  . sildenafil (REVATIO) 20 MG tablet Take 1-3 tablets (20-60 mg total) by mouth daily as needed.   No facility-administered encounter medications on file as of 10/07/2019.     Review of Systems  Constitutional: Positive for chills and fever.  HENT: Positive for congestion, ear pain, postnasal drip, rhinorrhea, sinus pressure, sneezing and sore throat. Negative for ear discharge and voice change.   Eyes: Negative for pain, discharge, redness and visual disturbance.  Respiratory: Positive for cough, shortness of breath and wheezing.   Cardiovascular: Negative for chest pain and leg swelling.  Musculoskeletal: Negative for gait problem.  Skin: Negative for rash.  All other systems reviewed and are negative.   Observations/Objective: Patient sounds short of breath and stops when finishing sentences.   Assessment and Plan: Problem List Items Addressed This Visit      Cardiovascular and Mediastinum   HYPERTENSION, BENIGN     Musculoskeletal and Integument   Degenerative disc disease, lumbar   Relevant Medications   predniSONE  (DELTASONE) 20 MG tablet   carisoprodol (SOMA) 350 MG tablet    Other Visit Diagnoses    COPD exacerbation (HCC)    -  Primary   Relevant Medications   albuterol (PROVENTIL) (2.5 MG/3ML) 0.083% nebulizer solution   azithromycin (ZITHROMAX) 250 MG tablet   predniSONE (DELTASONE) 20 MG tablet   Other Relevant Orders   Novel Coronavirus, NAA ([facility])   Uncomplicated opioid dependence (HCC)       Relevant Medications   carisoprodol (SOMA) 350 MG tablet       Follow Up Instructions:   follow up in 1 months for pain and diabetes  Patient need a refill on the Soma as well, is not quite due for his oxycodone he has 1 more month.  Patient sounds like possible COVID symptoms versus COPD exacerbation, will treat as COPD exacerbation but recommended quarantine and recommended the going for coronavirus testing.   I discussed the assessment and treatment plan with the patient. The patient was provided an opportunity to ask questions and all were answered. The patient agreed with the plan and demonstrated an understanding of the instructions.   The patient was advised to call back or seek an in-person evaluation if the symptoms worsen or if the condition fails to improve as anticipated.  The above assessment and management plan was discussed with the patient. The patient verbalized understanding of and has agreed to the management plan. Patient is aware to call the clinic if symptoms persist or worsen. Patient is aware when to return to the clinic for a follow-up visit. Patient educated on when it is appropriate to go to the emergency department.    I provided 17 minutes of non-face-to-face time during this encounter.    Nils Pyle, MD

## 2019-10-09 LAB — NOVEL CORONAVIRUS, NAA: SARS-CoV-2, NAA: NOT DETECTED

## 2019-11-08 ENCOUNTER — Other Ambulatory Visit: Payer: Self-pay | Admitting: Internal Medicine

## 2019-11-08 ENCOUNTER — Other Ambulatory Visit: Payer: Self-pay | Admitting: Family Medicine

## 2019-11-08 DIAGNOSIS — M5136 Other intervertebral disc degeneration, lumbar region: Secondary | ICD-10-CM

## 2019-11-08 DIAGNOSIS — F1120 Opioid dependence, uncomplicated: Secondary | ICD-10-CM

## 2019-11-10 ENCOUNTER — Other Ambulatory Visit: Payer: Self-pay

## 2019-11-11 ENCOUNTER — Ambulatory Visit (INDEPENDENT_AMBULATORY_CARE_PROVIDER_SITE_OTHER): Payer: Medicare HMO | Admitting: Family Medicine

## 2019-11-11 ENCOUNTER — Encounter: Payer: Self-pay | Admitting: Family Medicine

## 2019-11-11 VITALS — BP 143/81 | HR 61 | Temp 95.3°F | Ht 73.0 in | Wt 269.2 lb

## 2019-11-11 DIAGNOSIS — E7800 Pure hypercholesterolemia, unspecified: Secondary | ICD-10-CM | POA: Diagnosis not present

## 2019-11-11 DIAGNOSIS — I10 Essential (primary) hypertension: Secondary | ICD-10-CM | POA: Diagnosis not present

## 2019-11-11 DIAGNOSIS — M5136 Other intervertebral disc degeneration, lumbar region: Secondary | ICD-10-CM

## 2019-11-11 DIAGNOSIS — R7303 Prediabetes: Secondary | ICD-10-CM | POA: Diagnosis not present

## 2019-11-11 DIAGNOSIS — J439 Emphysema, unspecified: Secondary | ICD-10-CM | POA: Diagnosis not present

## 2019-11-11 DIAGNOSIS — Z23 Encounter for immunization: Secondary | ICD-10-CM | POA: Diagnosis not present

## 2019-11-11 DIAGNOSIS — M51369 Other intervertebral disc degeneration, lumbar region without mention of lumbar back pain or lower extremity pain: Secondary | ICD-10-CM

## 2019-11-11 DIAGNOSIS — F1120 Opioid dependence, uncomplicated: Secondary | ICD-10-CM

## 2019-11-11 DIAGNOSIS — M5410 Radiculopathy, site unspecified: Secondary | ICD-10-CM | POA: Diagnosis not present

## 2019-11-11 LAB — BAYER DCA HB A1C WAIVED: HB A1C (BAYER DCA - WAIVED): 5.6 % (ref <7.0)

## 2019-11-11 MED ORDER — OXYCODONE-ACETAMINOPHEN 10-325 MG PO TABS: 1.0000 | ORAL_TABLET | Freq: Three times a day (TID) | ORAL | 0 refills | Status: DC | PRN

## 2019-11-11 MED ORDER — SPIRIVA HANDIHALER 18 MCG IN CAPS: 18.0000 ug | ORAL_CAPSULE | Freq: Every day | RESPIRATORY_TRACT | 12 refills | Status: DC

## 2019-11-11 MED ORDER — CETIRIZINE HCL 10 MG PO TABS: 10.0000 mg | ORAL_TABLET | Freq: Every day | ORAL | 3 refills | Status: AC

## 2019-11-11 MED ORDER — CARISOPRODOL 350 MG PO TABS: 350.0000 mg | ORAL_TABLET | Freq: Three times a day (TID) | ORAL | 1 refills | Status: DC | PRN

## 2019-11-11 NOTE — Progress Notes (Signed)
BP (!) 143/81   Pulse 61   Temp (!) 95.3 F (35.2 C) (Temporal)   Ht 6\' 1"  (1.854 m)   Wt 269 lb 3.2 oz (122.1 kg)   SpO2 97%   BMI 35.52 kg/m    Subjective:   Patient ID: Brian Harrington, male    DOB: [birth year], 73 y.o.   MRN: [id number]  HPI: Brian Harrington is a 73 y.o. male presenting on 11/11/2019 for Diabetes (1 month follow up)   HPI Pain assessment: Cause of pain-degenerative disc disease with history of surgery, he has new radicular symptoms that are worsening down his right leg and then them also gone down his left leg over the past few months.  He says with the weather changes especially been bad.  He denies any falls or specific trauma. Pain location-bilateral low back Pain on scale of 1-10- 9 Frequency-Daily, constant What increases pain-right now everything, he feels like he has a pinched nerve and even standing or moving it all feels like it makes it go down his legs What makes pain Better-medication Effects on ADL -more limiting now because of pain going down his legs Any change in general medical condition-none, except COPD is worsening slightly  Current opioids rx- Percocet 10-3 25 3  times daily as needed, Soma 350 mg 3 times daily as needed # meds rx-70 Percocet, 90 Soma Effectiveness of current meds-works well except recently it has been worse Adverse reactions form pain meds-none Morphine equivalent-45  Pill count performed-No Last drug screen -07/30/2019 ( high risk q50m, moderate risk q76m, low risk yearly ) Urine drug screen today- No Was the [facility] reviewed-yes  If yes were their any concerning findings? -None  No flowsheet data found.   Pain contract signed on: 08/11/2019  COPD Patient is coming in for COPD recheck today.  He is currently on Combivent and Symbicort and is having some more congestion recently.  He has a mild chronic cough but denies any major coughing spells or wheezing spells.  He has 5nighttime symptoms per week and 7daytime  symptoms per week currently.   Hypertension Patient is currently on metoprolol, and their blood pressure today is 143/81. Patient denies any lightheadedness or dizziness. Patient denies headaches, blurred vision, chest pains, shortness of breath, or weakness. Denies any side effects from medication and is content with current medication.   Prediabetes Patient comes in today for recheck of his diabetes. Patient has been currently taking no medication currently we are monitoring. Patient is not currently on an ACE inhibitor/ARB. Patient has not seen an ophthalmologist this year. Patient denies any issues with their feet.   Hyperlipidemia Patient is coming in for recheck of his hyperlipidemia. The patient is currently taking no medication we are monitoring, has been intolerant of statin previously. They deny any issues with myalgias or history of liver damage from it. They deny any focal numbness or weakness or chest pain.   Relevant past medical, surgical, family and social history reviewed and updated as indicated. Interim medical history since our last visit reviewed. Allergies and medications reviewed and updated.  Review of Systems  Constitutional: Negative for chills and fever.  Eyes: Negative for visual disturbance.  Respiratory: Negative for shortness of breath and wheezing.   Cardiovascular: Negative for chest pain and leg swelling.  Musculoskeletal: Positive for back pain. Negative for arthralgias and gait problem.  Skin: Negative for rash.  Neurological: Negative for dizziness, weakness and light-headedness.  All other systems reviewed and are negative.  Per HPI unless specifically indicated above   Allergies as of 11/11/2019      Reactions   Amlodipine Besylate Shortness Of Breath, Swelling   REACTION: tongue and facial swelling, difficulty breathing   Sulfa Antibiotics Nausea And Vomiting   Sulfonamide Derivatives Nausea And Vomiting      Medication List        Accurate as of November 11, 2019 11:59 PM. If you have any questions, ask your nurse or doctor.        STOP taking these medications   azithromycin 250 MG tablet Commonly known as: ZITHROMAX Stopped by: Elige RadonJoshua A Dettinger, MD   loratadine 10 MG tablet Commonly known as: CLARITIN Stopped by: Elige RadonJoshua A Dettinger, MD   predniSONE 20 MG tablet Commonly known as: DELTASONE Stopped by: Elige RadonJoshua A Dettinger, MD     TAKE these medications   albuterol (2.5 MG/3ML) 0.083% nebulizer solution Commonly known as: PROVENTIL Take 3 mLs (2.5 mg total) by nebulization every 4 (four) hours as needed for wheezing or shortness of breath.   budesonide-formoterol 80-4.5 MCG/ACT inhaler Commonly known as: Symbicort Take 2 puffs first thing in am and then another 2 puffs about 12 hours later.   carisoprodol 350 MG tablet Commonly known as: SOMA Take 1 tablet (350 mg total) by mouth 3 (three) times daily as needed. for muscle spams Start taking on: December 10, 2019 What changed: These instructions start on December 10, 2019. If you are unsure what to do until then, ask your doctor or other care provider. Changed by: Elige RadonJoshua A Dettinger, MD   cetirizine 10 MG tablet Commonly known as: ZYRTEC Take 1 tablet (10 mg total) by mouth daily. Started by: Elige RadonJoshua A Dettinger, MD   Combivent Respimat 20-100 MCG/ACT Aers respimat Generic drug: Ipratropium-Albuterol USE 1 PUFF EVERY 4 HOURS AS NEEDED   famotidine 20 MG tablet Commonly known as: Pepcid One after breakfast   fluticasone 50 MCG/ACT nasal spray Commonly known as: FLONASE Place 1 spray into both nostrils 2 (two) times daily as needed for allergies or rhinitis.   metoprolol tartrate 25 MG tablet Commonly known as: LOPRESSOR Take 1 tablet (25 mg total) by mouth 2 (two) times daily.   mupirocin cream 2 % Commonly known as: Bactroban Apply 1 application topically 2 (two) times daily.   oxyCODONE-acetaminophen 10-325 MG tablet Commonly known  as: PERCOCET Take 1 tablet by mouth every 8 (eight) hours as needed for pain. What changed: Another medication with the same name was changed. Make sure you understand how and when to take each. Changed by: Nils PyleJoshua A Dettinger, MD   oxyCODONE-acetaminophen 10-325 MG tablet Commonly known as: PERCOCET Take 1 tablet by mouth every 8 (eight) hours as needed for pain. Start taking on: December 10, 2019 What changed:   additional instructions  These instructions start on December 10, 2019. If you are unsure what to do until then, ask your doctor or other care provider. Changed by: Nils PyleJoshua A Dettinger, MD   oxyCODONE-acetaminophen 10-325 MG tablet Commonly known as: PERCOCET Take 1 tablet by mouth every 8 (eight) hours as needed for pain. Start taking on: January 10, 2020 What changed:   additional instructions  These instructions start on January 10, 2020. If you are unsure what to do until then, ask your doctor or other care provider. Changed by: Elige RadonJoshua A Dettinger, MD   OXYGEN Inhale 2 L into the lungs continuous. 2 liters as needed   pantoprazole 40 MG tablet Commonly known as: PROTONIX TAKE ONE (  1) TABLET EACH DAY   sildenafil 20 MG tablet Commonly known as: REVATIO Take 1-3 tablets (20-60 mg total) by mouth daily as needed.   Spiriva HandiHaler 18 MCG inhalation capsule Generic drug: tiotropium Place 1 capsule (18 mcg total) into inhaler and inhale daily. Started by: Fransisca Kaufmann Dettinger, MD        Objective:   BP (!) 143/81   Pulse 61   Temp (!) 95.3 F (35.2 C) (Temporal)   Ht 6\' 1"  (1.854 m)   Wt 269 lb 3.2 oz (122.1 kg)   SpO2 97%   BMI 35.52 kg/m   Wt Readings from Last 3 Encounters:  11/11/19 269 lb 3.2 oz (122.1 kg)  08/12/19 254 lb 3.2 oz (115.3 kg)  05/19/19 269 lb 3.2 oz (122.1 kg)    Physical Exam Vitals signs and nursing note reviewed.  Constitutional:      General: He is not in acute distress.    Appearance: He is well-developed. He is not  diaphoretic.  Eyes:     General: No scleral icterus.    Conjunctiva/sclera: Conjunctivae normal.  Neck:     Musculoskeletal: Neck supple.     Thyroid: No thyromegaly.  Cardiovascular:     Rate and Rhythm: Normal rate and regular rhythm.     Heart sounds: Normal heart sounds. No murmur.  Pulmonary:     Effort: Pulmonary effort is normal. No respiratory distress.     Breath sounds: Wheezing present. No rhonchi or rales.  Chest:     Chest wall: No tenderness.  Musculoskeletal: Normal range of motion.        General: Tenderness (b/l lumbar tenderness) present.  Lymphadenopathy:     Cervical: No cervical adenopathy.  Skin:    General: Skin is warm and dry.     Findings: No rash.  Neurological:     Mental Status: He is alert and oriented to person, place, and time.     Coordination: Coordination normal.  Psychiatric:        Behavior: Behavior normal.       Assessment & Plan:   Problem List Items Addressed This Visit      Cardiovascular and Mediastinum   HYPERTENSION, BENIGN - Primary     Respiratory   COPD (chronic obstructive pulmonary disease) (HCC)   Relevant Medications   tiotropium (SPIRIVA HANDIHALER) 18 MCG inhalation capsule   cetirizine (ZYRTEC) 10 MG tablet     Musculoskeletal and Integument   Degenerative disc disease, lumbar   Relevant Medications   oxyCODONE-acetaminophen (PERCOCET) 10-325 MG tablet (Start on 12/10/2019)   oxyCODONE-acetaminophen (PERCOCET) 10-325 MG tablet (Start on 01/10/2020)   oxyCODONE-acetaminophen (PERCOCET) 10-325 MG tablet   carisoprodol (SOMA) 350 MG tablet (Start on 12/10/2019)   Other Relevant Orders   MR Lumbar Spine Wo Contrast     Other   Hypercholesterolemia    Other Visit Diagnoses    Uncomplicated opioid dependence ([place])       Relevant Medications   oxyCODONE-acetaminophen (PERCOCET) 10-325 MG tablet (Start on 12/10/2019)   oxyCODONE-acetaminophen (PERCOCET) 10-325 MG tablet (Start on 01/10/2020)    oxyCODONE-acetaminophen (PERCOCET) 10-325 MG tablet   carisoprodol (SOMA) 350 MG tablet (Start on 12/10/2019)   Other Relevant Orders   MR Lumbar Spine Wo Contrast   Radicular pain of right lower extremity       Relevant Orders   MR Lumbar Spine Wo Contrast   Prediabetes       Relevant Orders   hgba1c (Completed)   Need  for immunization against influenza       Relevant Orders   Flu Vaccine QUAD High Dose(Fluad) (Completed)      Any pain medication, will do an MRI because he still having more issues with the back than he was previously.  We will add Spiriva to his COPD regimen to see if that can help his breathing some more.  We will also change from Claritin to Zyrtec to see if that helps his allergies and symptoms more.  Barbies and to the girls picked out some, [place] something something like that Follow up plan: Return in about 3 months (around 02/11/2020), or if symptoms worsen or fail to improve, for Diabetes and pain management hypertension.  Counseling provided for all of the vaccine components Orders Placed This Encounter  Procedures  . MR Lumbar Spine Wo Contrast  . Flu Vaccine QUAD High Dose(Fluad)  . hgba1c    Arville Care, MD [facility] 11/14/2019, 1:50 PM

## 2019-11-19 DIAGNOSIS — D869 Sarcoidosis, unspecified: Secondary | ICD-10-CM | POA: Diagnosis not present

## 2019-11-21 ENCOUNTER — Ambulatory Visit (HOSPITAL_COMMUNITY)
Admission: RE | Admit: 2019-11-21 | Discharge: 2019-11-21 | Disposition: A | Discharge status: Home / Self Care | Payer: Medicare HMO | Source: Ambulatory Visit | Attending: Family Medicine | Admitting: Family Medicine

## 2019-11-21 ENCOUNTER — Other Ambulatory Visit: Payer: Self-pay

## 2019-11-21 DIAGNOSIS — M5126 Other intervertebral disc displacement, lumbar region: Secondary | ICD-10-CM | POA: Diagnosis not present

## 2019-11-21 DIAGNOSIS — F1120 Opioid dependence, uncomplicated: Secondary | ICD-10-CM | POA: Diagnosis not present

## 2019-11-21 DIAGNOSIS — M48061 Spinal stenosis, lumbar region without neurogenic claudication: Secondary | ICD-10-CM | POA: Diagnosis not present

## 2019-11-21 DIAGNOSIS — M5136 Other intervertebral disc degeneration, lumbar region: Secondary | ICD-10-CM | POA: Insufficient documentation

## 2019-11-21 DIAGNOSIS — M5410 Radiculopathy, site unspecified: Secondary | ICD-10-CM | POA: Diagnosis not present

## 2019-11-28 ENCOUNTER — Telehealth: Payer: Self-pay | Admitting: Family Medicine

## 2019-11-28 DIAGNOSIS — M4807 Spinal stenosis, lumbosacral region: Secondary | ICD-10-CM

## 2019-11-28 NOTE — Telephone Encounter (Signed)
Have placed referral, for now use ibuprofen for breakthrough, with food

## 2019-11-28 NOTE — Telephone Encounter (Signed)
-----   Message from Karle Plumber, [place] sent at 11/22/2019  8:52 AM EST ----- Patient aware and verbalizes understanding. Patient states that he would like to be sent to the spinal surgeon.  The one he previous went to moved to [facility] so he would like to go where ever Dr. Keturah Barre suggests.  Also requesting a pain medication for break through pain.  Aware you are out of the office today and will return tomorrow.   Please advise

## 2019-11-28 NOTE — Telephone Encounter (Signed)
Patient notified about referral and IBU for break through pain. Patient verbalized understanding and states that the IBU "won't do shit." " If I wanted a drink of water I would go get a drink of water." I advised patient our referral coordinator would contact him as soon as possible with an appointment

## 2019-12-05 ENCOUNTER — Other Ambulatory Visit: Payer: Self-pay | Admitting: Family Medicine

## 2019-12-17 IMAGING — MR MR LUMBAR SPINE W/O CM
4 of 5 series · 12 of 48 positions shown · non-contrast
Comparison: Lumbar spine MRI 04/01/2017

CLINICAL DATA: Degenerative disc disease, lumbar. Uncomplicated
opioid dependence. Radicular pain of right lower extremity. Back
pain, prior surgery, new or progressive symptoms

EXAM:
MRI LUMBAR SPINE WITHOUT CONTRAST
TECHNIQUE: Multiplanar, multisequence MR imaging of the lumbar spine was
performed. No intravenous contrast was administered.

[Series 3: T2 · sagittal · 4.0mm · 0.52mm/px · 3 of 15 slices shown (1 of 3)]
[im 3/15]
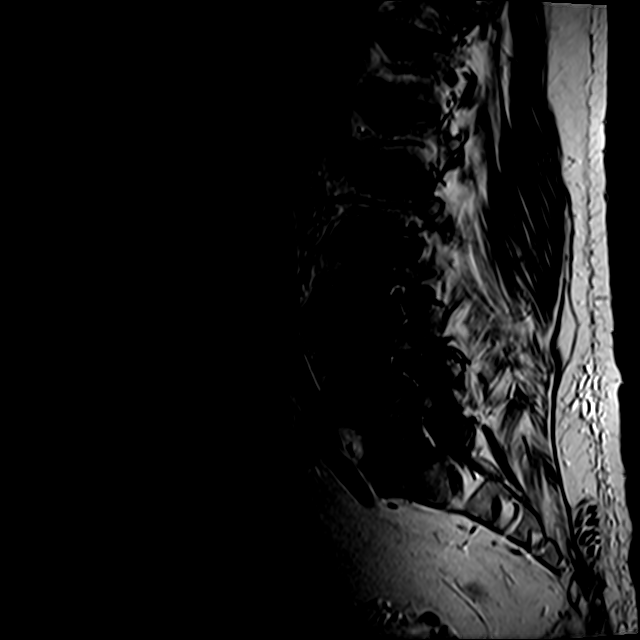
[im 9/15]
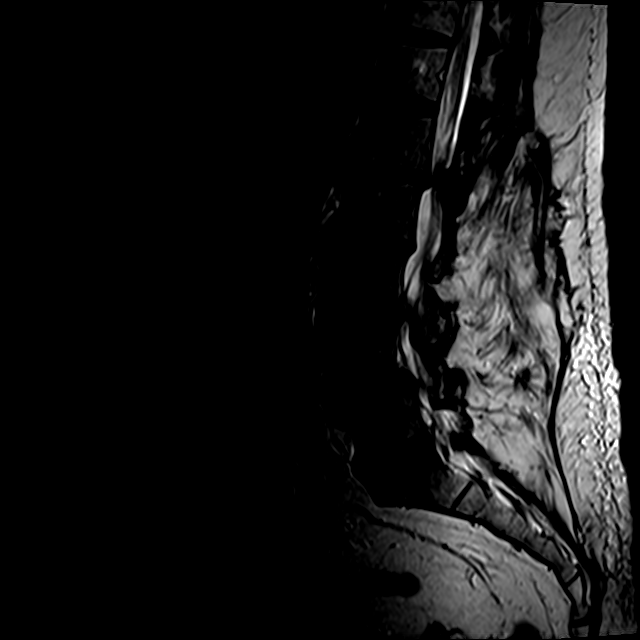
[im 15/15]
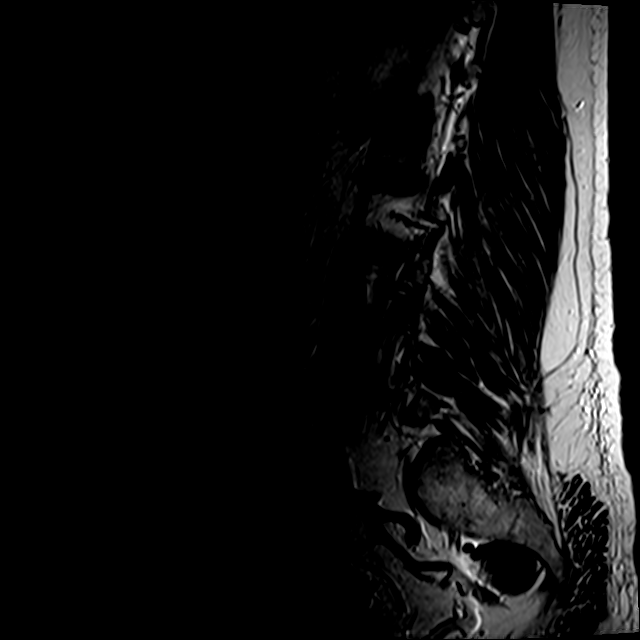

[Series 4: T1 · sagittal · 4.0mm · 0.52mm/px · 3 of 15 slices shown]
[im 3/15]
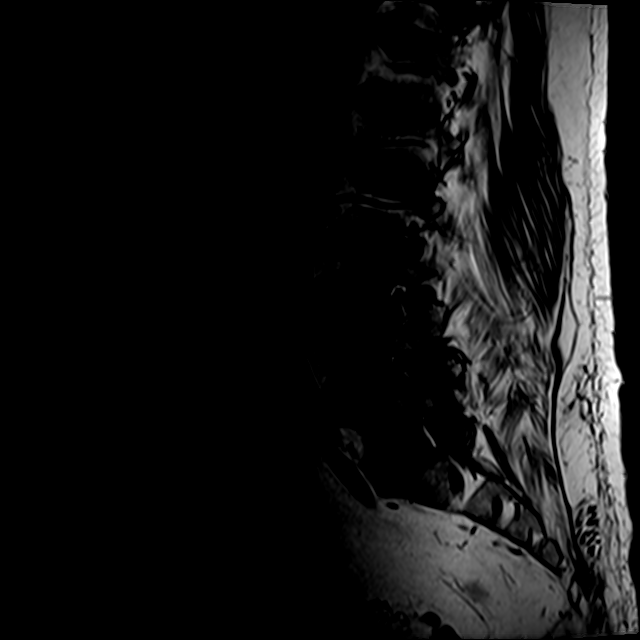
[im 9/15]
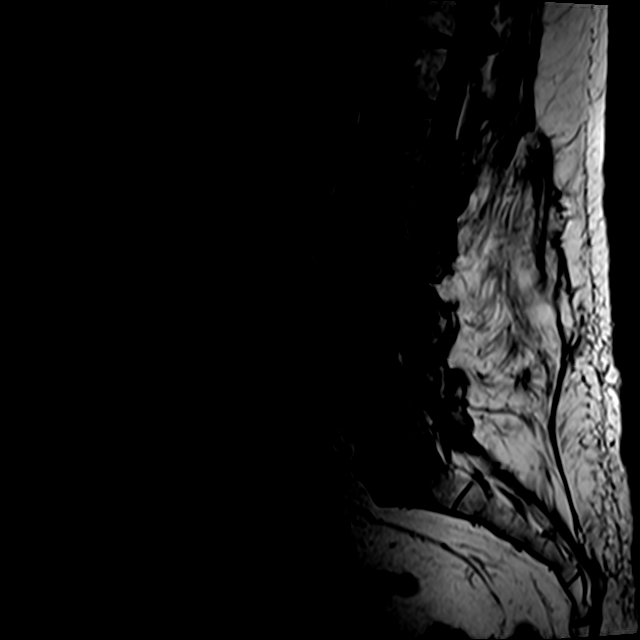
[im 15/15]
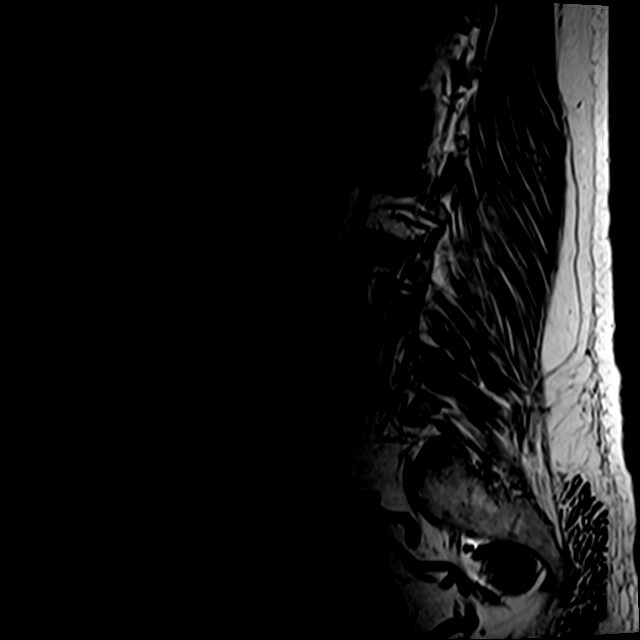

[Series 5: T2 · sagittal · 4.0mm · 0.52mm/px · 3 of 18 slices shown (2 of 3)]
[im 4/18]
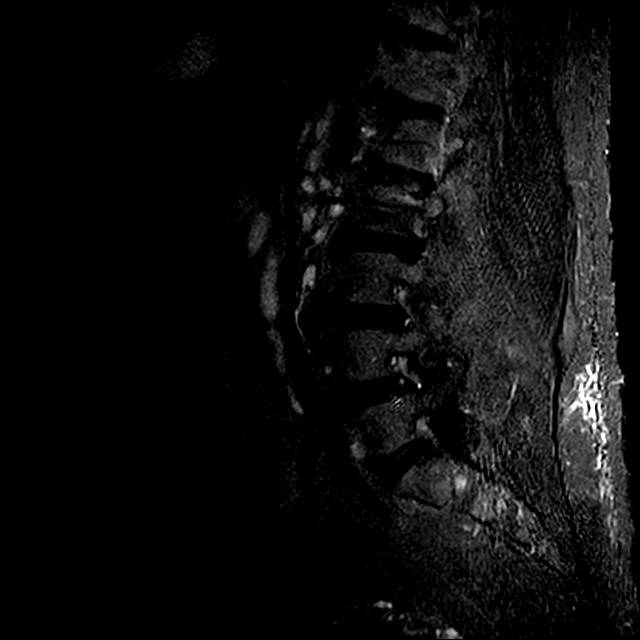
[im 11/18]
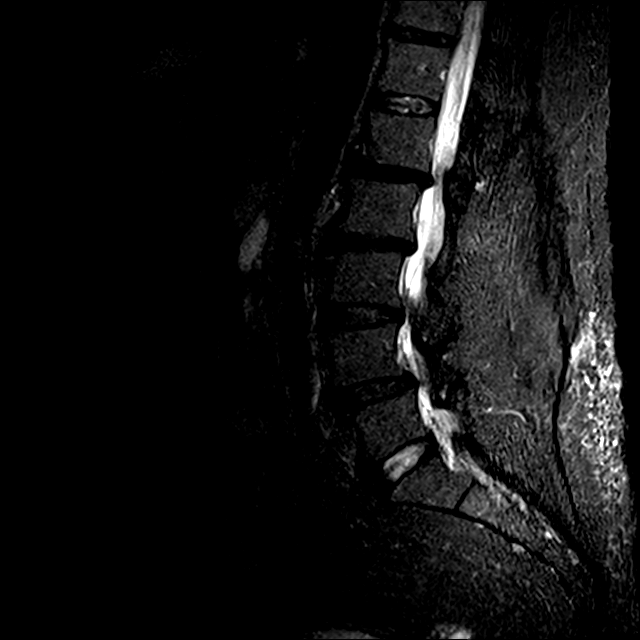
[im 18/18]
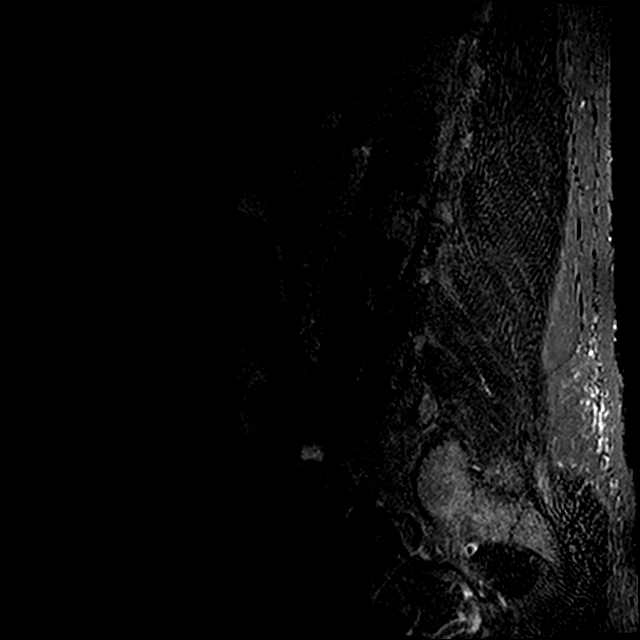

[Series 6: T2 · axial · 4.0mm · 0.26mm/px · z∈[-157,+6]mm · 3 of 43 slices shown (3 of 3)]
[im 7/43]
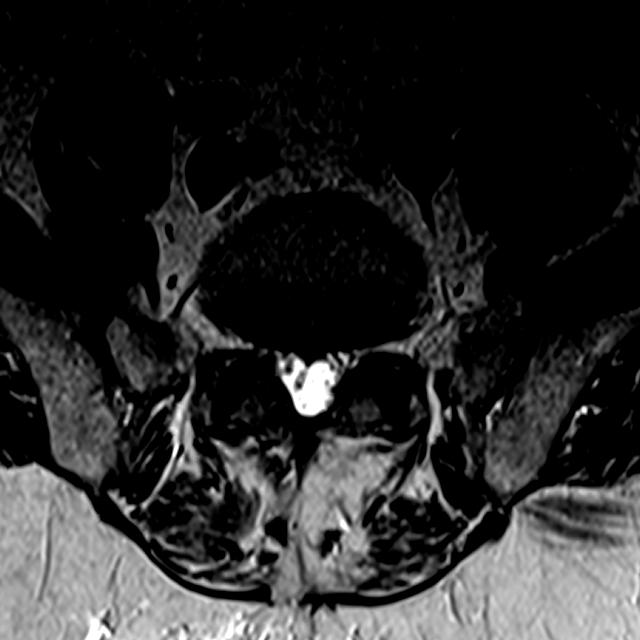
[im 22/43]
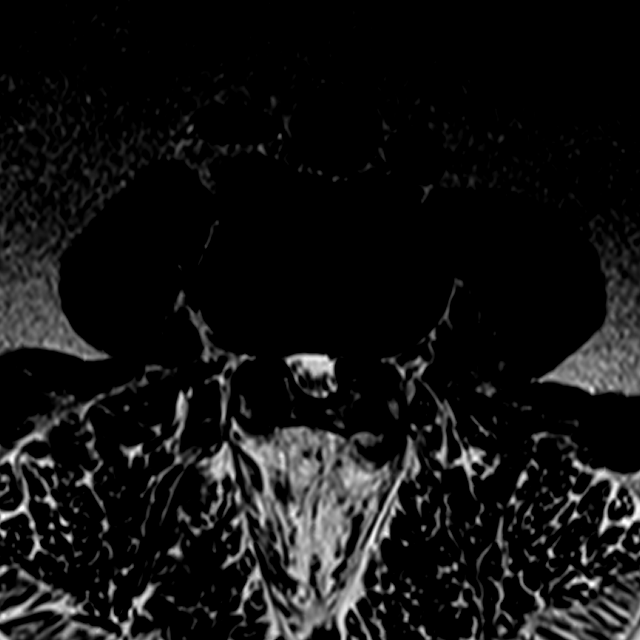
[im 37/43]
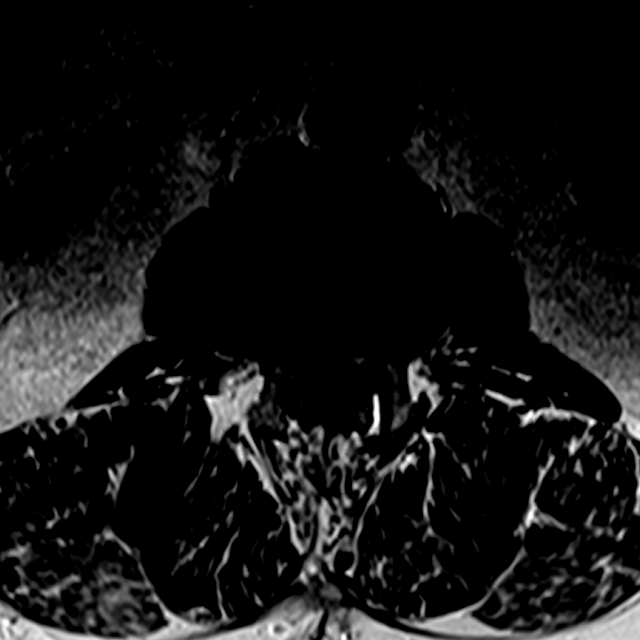

[12 of 48 positions shown; findings below may reference images not displayed]

FINDINGS: Segmentation: For the purposes of this dictation, five lumbar
vertebrae are assumed and the caudal most well-formed intervertebral
disc is designated L5-S1.

Alignment: Trace L1-L2 and L2-L3 grade 1 retrolisthesis. 3 mm L4-L5
grade 1 anterolisthesis.

Vertebrae: Vertebral body height is maintained. Mild multilevel
degenerative endplate irregularity. Trace degenerative endplate
edema at L1-L2. No suspicious osseous lesions.

Conus medullaris and cauda equina: Conus extends to the L1 level. No
signal abnormality within the visualized distal spinal cord

Paraspinal and other soft tissues: No abnormality identified within
included portions of the abdomen/retroperitoneum. Atrophy of the
lumbar paraspinal musculature. Nonspecific edema signal within the
dorsal subcutaneous fat.

Disc levels:

Unless otherwise stated, the level by level findings below have not
significantly changed since prior MRI 04/01/2017.

Multilevel disc degenerate has progressed from prior MRI. Moderate
disc degeneration at L1-L2 and L2-L3. Mild disc degeneration at the
remaining levels.

T12-L1: Moderate facet arthrosis. No significant disc herniation,
spinal canal or neural foraminal narrowing.

L1-L2: Interval posterior decompression. Trace retrolisthesis. Disc
bulge with endplate spurring. Facet hypertrophy. Prominent low
signal soft tissue along the dorsal aspect of the spinal canal
likely reflecting scar and residual hypertrophied ligamentum flavum.
Severe spinal canal stenosis with near complete effacement of the
thecal sac, progressed from prior MRI (series 6, image 7). Mild
right with moderate left neural foraminal narrowing, progressed.

L2-L3: Prior posterior decompression. Trace retrolisthesis. Disc
bulge with endplate spurring. Facet hypertrophy. No significant
spinal canal stenosis. Moderate bilateral neural foraminal narrowing
(greater on the right).

L3-L4: Prior posterior decompression. Disc bulge with endplate
spurring. Facet hypertrophy. Unchanged partial decompression of the
spinal canal with mild bilateral subarticular narrowing. Moderate
bilateral neural foraminal narrowing.

L4-L5: Prior decompression. Grade 1 anterolisthesis. Disc bulge.
Facet hypertrophy. Mild bilateral subarticular and central canal
narrowing. Mild/moderate bilateral neural foraminal narrowing.

L5-S1: Prior posterior decompression. Minimal disc bulge. Mild facet
arthrosis/ligamentum flavum hypertrophy. Minimal left subarticular
narrowing without significant central canal stenosis. No significant
neural foraminal narrowing.
IMPRESSION: At L1-L2, there has been interval posterior surgical decompressed.
However, there is severe multifactorial spinal canal stenosis at
this level related to disc bulge with endplate spurring, facet
hypertrophy as well as scar/hypertrophied ligamentum flavum along
the posterior aspect of the spinal canal. Mild right with moderate
left neural foraminal narrowing at this level.

Lumbar spondylosis and postoperative changes are otherwise unchanged
from MRI 04/01/2017. No more than mild spinal canal narrowing at the
remaining levels. Additional sites of neural foraminal narrowing,
greatest bilaterally at L2-L3 and L3-L4 (moderate in severity at
these sites).

## 2020-01-17 ENCOUNTER — Telehealth: Payer: Self-pay

## 2020-01-17 NOTE — Telephone Encounter (Signed)
Patient called and left message on voicemail that he needs records from his lumbar and cervical surgeries sent to his neurosurgereon that he was referred to; patient did not leave the name of the doctor that he is seeing.  I am unable to send these notes, I did review the referral in the chart and it looks like clinical notes have been sent to the office.  Please review.  Patient can be reached at number listed in chart.

## 2020-01-20 NOTE — Telephone Encounter (Signed)
Faxed past 4 surgical records to [facility]

## 2020-02-07 ENCOUNTER — Other Ambulatory Visit: Payer: Self-pay | Admitting: Family Medicine

## 2020-02-07 DIAGNOSIS — F1120 Opioid dependence, uncomplicated: Secondary | ICD-10-CM

## 2020-02-07 DIAGNOSIS — M5136 Other intervertebral disc degeneration, lumbar region: Secondary | ICD-10-CM

## 2020-02-08 DIAGNOSIS — T1511XA Foreign body in conjunctival sac, right eye, initial encounter: Secondary | ICD-10-CM | POA: Diagnosis not present

## 2020-02-08 NOTE — Telephone Encounter (Signed)
Patient aware and verbalizes understanding. 

## 2020-02-08 NOTE — Telephone Encounter (Signed)
Brian Harrington is controlled and therefore we can fill it during the visit.  We will get him on track for next visit.  Trying to space out what he has left until then.

## 2020-02-09 DIAGNOSIS — M48062 Spinal stenosis, lumbar region with neurogenic claudication: Secondary | ICD-10-CM | POA: Diagnosis not present

## 2020-02-10 ENCOUNTER — Encounter: Payer: Self-pay | Admitting: Physician Assistant

## 2020-02-10 ENCOUNTER — Telehealth: Payer: Self-pay | Admitting: Family Medicine

## 2020-02-10 ENCOUNTER — Ambulatory Visit (INDEPENDENT_AMBULATORY_CARE_PROVIDER_SITE_OTHER): Payer: Medicare HMO | Admitting: Physician Assistant

## 2020-02-10 DIAGNOSIS — J441 Chronic obstructive pulmonary disease with (acute) exacerbation: Secondary | ICD-10-CM

## 2020-02-10 DIAGNOSIS — J439 Emphysema, unspecified: Secondary | ICD-10-CM | POA: Diagnosis not present

## 2020-02-10 MED ORDER — AZITHROMYCIN 250 MG PO TABS: ORAL_TABLET | ORAL | 0 refills | Status: DC

## 2020-02-10 MED ORDER — PREDNISONE 10 MG (21) PO TBPK: ORAL_TABLET | ORAL | 0 refills | Status: DC

## 2020-02-10 NOTE — Telephone Encounter (Signed)
What is the name of the medication? zpack  Have you contacted your pharmacy to request a refill? no  Which pharmacy would you like this sent to? The drug store in [place]   Patient notified that their request is being sent to the clinical staff for review and that they should receive a call once it is complete. If they do not receive a call within 24 hours they can check with their pharmacy or our office.

## 2020-02-10 NOTE — Telephone Encounter (Signed)
Patient is experiencing some cough and congestion, feels slightly sob, has history of COPD.  Reports normally Dr. Louanne Skye sends Zpak.  Explained to patient he would need to do tele-visit.  Appointment scheduled at 4:10 pm this afternoon with Prudy Feeler.

## 2020-02-10 NOTE — Progress Notes (Signed)
    Telephone visit  Subjective: CC:COPD exacerbation PCP: Dettinger, Joshua A, MD HPI:Brian Harrington is a 73 y.o. male calls for telephone consult today. Patient provides verbal consent for consult held via phone.  Patient is identified with 2 separate identifiers.  At this time the entire area is on COVID-19 social distancing and stay home orders are in place.  Patient is of higher risk and therefore we are performing this by a virtual method.  Location of patient: home Location of provider: [facility] Others present for call: no  History of COPD  Spells at weather change, gets congestion and flared up  zpak and prednisone usually help him, down  He currently does have inhalers and nebulized albuterol.  He also does have oxygen at home.  Of encouraged him to use it pretty regularly while he is at home in the next few days.  And if anything gets suddenly worse to go to the emergency room.  He denies any significant fever or chills.  He does is very tired and short of breath.  The mucus that he is producing has become colored, he is not seeing any blood tingeing   ROS: Per HPI  Allergies  Allergen Reactions  . Amlodipine Besylate Shortness Of Breath and Swelling    REACTION: tongue and facial swelling, difficulty breathing  . Sulfa Antibiotics Nausea And Vomiting  . Sulfonamide Derivatives Nausea And Vomiting   Past Medical History:  Diagnosis Date  . Arthritis   . Back pain, chronic   . CAD (coronary artery disease)   . COPD (chronic obstructive pulmonary disease) (HCC)    on 2L home O2  . Hypertension   . Sarcoidosis of lung (HCC)    reported by patient but no clear documentation of this condition  . Sleep apnea    no CPAP    Current Outpatient Medications:  .  albuterol (PROVENTIL) (2.5 MG/3ML) 0.083% nebulizer solution, Take 3 mLs (2.5 mg total) by nebulization every 4 (four) hours as needed for wheezing or shortness of breath., Disp: 750 mL, Rfl: 5 .   budesonide-formoterol (SYMBICORT) 80-4.5 MCG/ACT inhaler, Take 2 puffs first thing in am and then another 2 puffs about 12 hours later., Disp: 1 Inhaler, Rfl: 11 .  carisoprodol (SOMA) 350 MG tablet, Take 1 tablet (350 mg total) by mouth 3 (three) times daily as needed. for muscle spams, Disp: 90 tablet, Rfl: 1 .  cetirizine (ZYRTEC) 10 MG tablet, Take 1 tablet (10 mg total) by mouth daily., Disp: 90 tablet, Rfl: 3 .  COMBIVENT RESPIMAT 20-100 MCG/ACT AERS respimat, USE 1 PUFF EVERY 4 HOURS AS NEEDED, Disp: 12 g, Rfl: 0 .  famotidine (PEPCID) 20 MG tablet, One after breakfast, Disp: 90 tablet, Rfl: 3 .  fluticasone (FLONASE) 50 MCG/ACT nasal spray, Place 1 spray into both nostrils 2 (two) times daily as needed for allergies or rhinitis., Disp: 16 g, Rfl: 6 .  metoprolol tartrate (LOPRESSOR) 25 MG tablet, Take 1 tablet (25 mg total) by mouth 2 (two) times daily., Disp: 180 tablet, Rfl: 3 .  mupirocin cream (BACTROBAN) 2 %, Apply 1 application topically 2 (two) times daily., Disp: 30 g, Rfl: 3 .  NARCAN 4 MG/0.1ML LIQD nasal spray kit, USE 1 SPRAY INTO THE NOSE ONCE FOR 1 DOSE, Disp: 2 each, Rfl: 2 .  oxyCODONE-acetaminophen (PERCOCET) 10-325 MG tablet, Take 1 tablet by mouth every 8 (eight) hours as needed for pain., Disp: 70 tablet, Rfl: 0 .  oxyCODONE-acetaminophen (PERCOCET) 10-325 MG   tablet, Take 1 tablet by mouth every 8 (eight) hours as needed for pain., Disp: 70 tablet, Rfl: 0 .  oxyCODONE-acetaminophen (PERCOCET) 10-325 MG tablet, Take 1 tablet by mouth every 8 (eight) hours as needed for pain., Disp: 70 tablet, Rfl: 0 .  OXYGEN, Inhale 2 L into the lungs continuous. 2 liters as needed , Disp: , Rfl:  .  pantoprazole (PROTONIX) 40 MG tablet, TAKE ONE (1) TABLET EACH DAY, Disp: 90 tablet, Rfl: 3 .  sildenafil (REVATIO) 20 MG tablet, Take 1-3 tablets (20-60 mg total) by mouth daily as needed., Disp: 40 tablet, Rfl: 2 .  tiotropium (SPIRIVA HANDIHALER) 18 MCG inhalation capsule, Place 1 capsule  (18 mcg total) into inhaler and inhale daily., Disp: 30 capsule, Rfl: 12  Assessment/ Plan: 73 y.o. male   1. Pulmonary emphysema, unspecified emphysema type (HCC) - azithromycin (ZITHROMAX Z-PAK) 250 MG tablet; Take as directed  Dispense: 6 each; Refill: 0 - predniSONE (STERAPRED UNI-PAK 21 TAB) 10 MG (21) TBPK tablet; As directed x 6 days  Dispense: 21 tablet; Refill: 0  2. COPD exacerbation (HCC) - azithromycin (ZITHROMAX Z-PAK) 250 MG tablet; Take as directed  Dispense: 6 each; Refill: 0 - predniSONE (STERAPRED UNI-PAK 21 TAB) 10 MG (21) TBPK tablet; As directed x 6 days  Dispense: 21 tablet; Refill: 0   No follow-ups on file.  Continue all other maintenance medications as listed above.  Start time: 2:15 PM End time: 2:23 PM  No orders of the defined types were placed in this encounter.   Angel Jones PA-C [facility] [phone]  

## 2020-02-13 ENCOUNTER — Ambulatory Visit (INDEPENDENT_AMBULATORY_CARE_PROVIDER_SITE_OTHER): Payer: Medicare HMO | Admitting: Family Medicine

## 2020-02-13 ENCOUNTER — Encounter: Payer: Self-pay | Admitting: Family Medicine

## 2020-02-13 DIAGNOSIS — F1120 Opioid dependence, uncomplicated: Secondary | ICD-10-CM | POA: Diagnosis not present

## 2020-02-13 DIAGNOSIS — M5136 Other intervertebral disc degeneration, lumbar region: Secondary | ICD-10-CM

## 2020-02-13 MED ORDER — OXYCODONE-ACETAMINOPHEN 10-325 MG PO TABS: 1.0000 | ORAL_TABLET | Freq: Three times a day (TID) | ORAL | 0 refills | Status: DC | PRN

## 2020-02-13 MED ORDER — CARISOPRODOL 350 MG PO TABS: 350.0000 mg | ORAL_TABLET | Freq: Three times a day (TID) | ORAL | 2 refills | Status: DC | PRN

## 2020-02-13 NOTE — Progress Notes (Signed)
Virtual Visit via telephone Note  I connected with Brian Harrington on 02/13/20 at 0912 by telephone and verified that I am speaking with the correct person using two identifiers. Brian Harrington is currently located at home and no other people are currently with her during visit. The provider, Fransisca Kaufmann Mirinda Monte, MD is located in their office at time of visit.  Call ended at [phone]  I discussed the limitations, risks, security and privacy concerns of performing an evaluation and management service by telephone and the availability of in person appointments. I also discussed with the patient that there may be a patient responsible charge related to this service. The patient expressed understanding and agreed to proceed.   History and Present Illness: Pain assessment: Cause of pain- degenerative disc disease Pain location- lower back Pain on scale of 1-10- 5 Frequency- daily What increases pain-weather and standing What makes pain Better-medication and stretching Effects on ADL - few limitations Any change in general medical condition-none  Current opioids rx- percocet 10-325 q8h prn and soma 330m tid prn # meds rx- 70 percocet and 90 soma Effectiveness of current meds-works well Adverse reactions form pain meds-none Morphine equivalent- 45  Pill count performed-No Last drug screen - 08/22/2019 ( high risk q336mmoderate risk q6m6mow risk yearly ) Urine drug screen today- No, virtual, needs at next [place]  If yes were their any concerning findings? - none  No flowsheet data found.   Pain contract signed on: 08/22/2019  1. Degenerative disc disease, lumbar   2. [facility]   Outpatient Encounter Medications as of 02/13/2020  Medication Sig  . albuterol (PROVENTIL) (2.5 MG/3ML) 0.083% nebulizer solution Take 3 mLs (2.5 mg total) by nebulization every 4 (four) hours as needed for wheezing or shortness of breath.  . aMarland Kitchenithromycin  (ZITHROMAX Z-PAK) 250 MG tablet Take as directed  . budesonide-formoterol (SYMBICORT) 80-4.5 MCG/ACT inhaler Take 2 puffs first thing in am and then another 2 puffs about 12 hours later.  . carisoprodol (SOMA) 350 MG tablet Take 1 tablet (350 mg total) by mouth 3 (three) times daily as needed. for muscle spams  . cetirizine (ZYRTEC) 10 MG tablet Take 1 tablet (10 mg total) by mouth daily.  . COMBIVENT RESPIMAT 20-100 MCG/ACT AERS respimat USE 1 PUFF EVERY 4 HOURS AS NEEDED  . famotidine (PEPCID) 20 MG tablet One after breakfast  . fluticasone (FLONASE) 50 MCG/ACT nasal spray Place 1 spray into both nostrils 2 (two) times daily as needed for allergies or rhinitis.  . metoprolol tartrate (LOPRESSOR) 25 MG tablet Take 1 tablet (25 mg total) by mouth 2 (two) times daily.  . mupirocin cream (BACTROBAN) 2 % Apply 1 application topically 2 (two) times daily.  . NMarland KitchenRCAN 4 MG/0.1ML LIQD nasal spray kit USE 1 SPRAY INTO THE NOSE ONCE FOR 1 DOSE  . [START ON 04/05/2020] oxyCODONE-acetaminophen (PERCOCET) 10-325 MG tablet Take 1 tablet by mouth every 8 (eight) hours as needed for pain.  . [Derrill Memo 04/05/2020] oxyCODONE-acetaminophen (PERCOCET) 10-325 MG tablet Take 1 tablet by mouth every 8 (eight) hours as needed for pain.  . [Derrill Memo 03/05/2020] oxyCODONE-acetaminophen (PERCOCET) 10-325 MG tablet Take 1 tablet by mouth every 8 (eight) hours as needed for pain.  . OXYGEN Inhale 2 L into the lungs continuous. 2 liters as needed   . pantoprazole (PROTONIX) 40 MG tablet TAKE ONE (1) TABLET EACH DAY  . predniSONE (STERAPRED UNI-PAK 21 TAB) 10  MG (21) TBPK tablet As directed x 6 days  . sildenafil (REVATIO) 20 MG tablet Take 1-3 tablets (20-60 mg total) by mouth daily as needed.  . tiotropium (SPIRIVA HANDIHALER) 18 MCG inhalation capsule Place 1 capsule (18 mcg total) into inhaler and inhale daily.  . [DISCONTINUED] carisoprodol (SOMA) 350 MG tablet Take 1 tablet (350 mg total) by mouth 3 (three) times daily as  needed. for muscle spams  . [DISCONTINUED] oxyCODONE-acetaminophen (PERCOCET) 10-325 MG tablet Take 1 tablet by mouth every 8 (eight) hours as needed for pain.  . [DISCONTINUED] oxyCODONE-acetaminophen (PERCOCET) 10-325 MG tablet Take 1 tablet by mouth every 8 (eight) hours as needed for pain.  . [DISCONTINUED] oxyCODONE-acetaminophen (PERCOCET) 10-325 MG tablet Take 1 tablet by mouth every 8 (eight) hours as needed for pain.   No facility-administered encounter medications on file as of 02/13/2020.    Review of Systems  Constitutional: Negative for chills and fever.  Respiratory: Negative for shortness of breath and wheezing.   Cardiovascular: Negative for chest pain and leg swelling.  Musculoskeletal: Positive for arthralgias and back pain. Negative for gait problem.  Skin: Negative for rash.  All other systems reviewed and are negative.   Observations/Objective: Patient sounds comfortable and in no acute distress  Assessment and Plan: Problem List Items Addressed This Visit      Musculoskeletal and Integument   Degenerative disc disease, lumbar   Relevant Medications   oxyCODONE-acetaminophen (PERCOCET) 10-325 MG tablet (Start on 04/05/2020)   oxyCODONE-acetaminophen (PERCOCET) 10-325 MG tablet (Start on 04/05/2020)   oxyCODONE-acetaminophen (PERCOCET) 10-325 MG tablet (Start on 03/05/2020)   carisoprodol (SOMA) 350 MG tablet    Other Visit Diagnoses    Uncomplicated opioid dependence ([place])       Relevant Medications   oxyCODONE-acetaminophen (PERCOCET) 10-325 MG tablet (Start on 04/05/2020)   oxyCODONE-acetaminophen (PERCOCET) 10-325 MG tablet (Start on 04/05/2020)   oxyCODONE-acetaminophen (PERCOCET) 10-325 MG tablet (Start on 03/05/2020)   carisoprodol (SOMA) 350 MG tablet      Refill opiates, will keep going for now and he I seeing a back doctor.   Follow up plan: Return in about 16 weeks (around 06/04/2020), or if symptoms worsen or fail to improve, for diabetes and pain  refill.     I discussed the assessment and treatment plan with the patient. The patient was provided an opportunity to ask questions and all were answered. The patient agreed with the plan and demonstrated an understanding of the instructions.   The patient was advised to call back or seek an in-person evaluation if the symptoms worsen or if the condition fails to improve as anticipated.  The above assessment and management plan was discussed with the patient. The patient verbalized understanding of and has agreed to the management plan. Patient is aware to call the clinic if symptoms persist or worsen. Patient is aware when to return to the clinic for a follow-up visit. Patient educated on when it is appropriate to go to the emergency department.    I provided 12 minutes of non-face-to-face time during this encounter.    Worthy Rancher, MD

## 2020-02-18 ENCOUNTER — Other Ambulatory Visit: Payer: Self-pay | Admitting: Neurosurgery

## 2020-02-18 DIAGNOSIS — M48062 Spinal stenosis, lumbar region with neurogenic claudication: Secondary | ICD-10-CM

## 2020-03-01 ENCOUNTER — Ambulatory Visit
Admission: RE | Admit: 2020-03-01 | Discharge: 2020-03-01 | Disposition: A | Discharge status: Home / Self Care | Payer: Medicare HMO | Source: Ambulatory Visit | Attending: Neurosurgery | Admitting: Neurosurgery

## 2020-03-01 DIAGNOSIS — M48062 Spinal stenosis, lumbar region with neurogenic claudication: Secondary | ICD-10-CM

## 2020-03-01 DIAGNOSIS — M545 Low back pain: Secondary | ICD-10-CM | POA: Diagnosis not present

## 2020-03-02 ENCOUNTER — Other Ambulatory Visit: Payer: Self-pay | Admitting: Family Medicine

## 2020-03-05 ENCOUNTER — Other Ambulatory Visit: Payer: Self-pay | Admitting: Internal Medicine

## 2020-04-17 ENCOUNTER — Ambulatory Visit (INDEPENDENT_AMBULATORY_CARE_PROVIDER_SITE_OTHER): Payer: Medicare HMO | Admitting: Family

## 2020-04-17 ENCOUNTER — Encounter: Payer: Self-pay | Admitting: Family

## 2020-04-17 DIAGNOSIS — H6591 Unspecified nonsuppurative otitis media, right ear: Secondary | ICD-10-CM | POA: Diagnosis not present

## 2020-04-17 DIAGNOSIS — M48062 Spinal stenosis, lumbar region with neurogenic claudication: Secondary | ICD-10-CM | POA: Diagnosis not present

## 2020-04-17 MED ORDER — AMOXICILLIN-POT CLAVULANATE 875-125 MG PO TABS: 1.0000 | ORAL_TABLET | Freq: Two times a day (BID) | ORAL | 0 refills | Status: AC

## 2020-04-17 NOTE — Progress Notes (Signed)
   Virtual Visit via telephone Note Due to COVID-19 pandemic this visit was conducted virtually. This visit type was conducted due to national recommendations for restrictions regarding the COVID-19 Pandemic (e.g. social distancing, sheltering in place) in an effort to limit this patient's exposure and mitigate transmission in our community. All issues noted in this document were discussed and addressed.  A physical exam was not performed with this format.  I connected with Brian Harrington on 04/17/20 at 12:45 pm by telephone and verified that I am speaking with the correct person using two identifiers. Brian Harrington is currently located at home and no one is currently with him  during visit. The provider, Jannifer Rodney, FNP is located in their office at time of visit.  I discussed the limitations, risks, security and privacy concerns of performing an evaluation and management service by telephone and the availability of in person appointments. I also discussed with the patient that there may be a patient responsible charge related to this service. The patient expressed understanding and agreed to proceed.   History and Present Illness:  Otalgia  There is pain in the right ear. This is a recurrent problem. The current episode started more than 1 month ago. The problem occurs every few minutes. The problem has been waxing and waning. The pain is at a severity of 10/10. The pain is moderate. Associated symptoms include ear discharge and headaches. Pertinent negatives include no coughing, diarrhea, neck pain, rhinorrhea, sore throat or vomiting. He has tried acetaminophen for the symptoms. The treatment provided mild relief.      Review of Systems  HENT: Positive for ear discharge and ear pain. Negative for rhinorrhea and sore throat.   Respiratory: Negative for cough.   Gastrointestinal: Negative for diarrhea and vomiting.  Musculoskeletal: Negative for neck pain.  Neurological: Positive for  headaches.     Observations/Objective: No SOB or distress noted  Assessment and Plan: 1. Other nonsuppurative otitis media of right ear, unspecified chronicity If pain continues or does not improve will need to place referral to ENT Tylenol as needed Continue zyrtec  Keep follow up with PCP and Neurosurgereon - amoxicillin-clavulanate (AUGMENTIN) 875-125 MG tablet; Take 1 tablet by mouth 2 (two) times daily for 10 days.  Dispense: 20 tablet; Refill: 0    I discussed the assessment and treatment plan with the patient. The patient was provided an opportunity to ask questions and all were answered. The patient agreed with the plan and demonstrated an understanding of the instructions.   The patient was advised to call back or seek an in-person evaluation if the symptoms worsen or if the condition fails to improve as anticipated.  The above assessment and management plan was discussed with the patient. The patient verbalized understanding of and has agreed to the management plan. Patient is aware to call the clinic if symptoms persist or worsen. Patient is aware when to return to the clinic for a follow-up visit. Patient educated on when it is appropriate to go to the emergency department.   Time call ended:  12:54 pm  I provided 9 minutes of non-face-to-face time during this encounter.    Jannifer Rodney, FNP

## 2020-04-20 ENCOUNTER — Telehealth: Payer: Self-pay | Admitting: Nurse Practitioner

## 2020-04-20 ENCOUNTER — Other Ambulatory Visit: Payer: Self-pay | Admitting: Neurosurgery

## 2020-04-20 DIAGNOSIS — M48062 Spinal stenosis, lumbar region with neurogenic claudication: Secondary | ICD-10-CM

## 2020-04-20 NOTE — Telephone Encounter (Signed)
Phone call to patient to verify medication list and allergies for myelogram procedure. Pt aware he will not need to hold any medications for this procedure. Pre and post procedure instructions reviewed with pt. Pt verbalized understanding.  

## 2020-05-01 ENCOUNTER — Ambulatory Visit
Admission: RE | Admit: 2020-05-01 | Discharge: 2020-05-01 | Disposition: A | Discharge status: Home / Self Care | Payer: Medicare HMO | Source: Ambulatory Visit | Attending: Neurosurgery | Admitting: Neurosurgery

## 2020-05-01 VITALS — BP 166/103 | HR 65

## 2020-05-01 DIAGNOSIS — M79604 Pain in right leg: Secondary | ICD-10-CM

## 2020-05-01 DIAGNOSIS — M48061 Spinal stenosis, lumbar region without neurogenic claudication: Secondary | ICD-10-CM | POA: Diagnosis not present

## 2020-05-01 DIAGNOSIS — M48062 Spinal stenosis, lumbar region with neurogenic claudication: Secondary | ICD-10-CM

## 2020-05-01 DIAGNOSIS — M545 Low back pain: Secondary | ICD-10-CM

## 2020-05-01 DIAGNOSIS — M5126 Other intervertebral disc displacement, lumbar region: Secondary | ICD-10-CM | POA: Diagnosis not present

## 2020-05-01 MED ORDER — DIAZEPAM 5 MG PO TABS
5.0000 mg | ORAL_TABLET | Freq: Once | ORAL | Status: AC
  Administered 2020-05-01: 5 mg via ORAL

## 2020-05-01 MED ORDER — IOPAMIDOL (ISOVUE-M 200) INJECTION 41%
18.0000 mL | Freq: Once | INTRAMUSCULAR | Status: AC
  Administered 2020-05-01: 18 mL via INTRATHECAL

## 2020-05-01 NOTE — Discharge Instructions (Signed)

## 2020-05-02 ENCOUNTER — Telehealth: Payer: Self-pay | Admitting: Family Medicine

## 2020-05-02 NOTE — Telephone Encounter (Signed)
Patient's pain medication had not been cut back in quite some time, he should be getting 70 tablets a month which is what he has been getting most of this year and the end of last year.  If he feels like things are not working and needs help with something more than please have him schedule a visit to come in, these inquiries cannot be changed or adjusted over the phone.

## 2020-05-02 NOTE — Telephone Encounter (Signed)
Pt made aware that he will need to be seen to change pain medication. He was not happy about scheduling but agreed to 05/11/2020. He states that his initial Percocet Rx was for #120 and that he is now at 70. He can take up to 4 per day if he is active. If he lying in bed all day he only takes a 1/2?? Pt is scheduled to see a neurosurgeon next Tuesday. Patient stated that he has packing left in his back and that is why he is in pain.

## 2020-05-03 ENCOUNTER — Other Ambulatory Visit: Payer: Self-pay | Admitting: Internal Medicine

## 2020-05-03 ENCOUNTER — Other Ambulatory Visit: Payer: Self-pay | Admitting: Family Medicine

## 2020-05-03 DIAGNOSIS — F1120 Opioid dependence, uncomplicated: Secondary | ICD-10-CM

## 2020-05-03 DIAGNOSIS — M5136 Other intervertebral disc degeneration, lumbar region: Secondary | ICD-10-CM

## 2020-05-08 DIAGNOSIS — M48062 Spinal stenosis, lumbar region with neurogenic claudication: Secondary | ICD-10-CM | POA: Diagnosis not present

## 2020-05-08 DIAGNOSIS — M5136 Other intervertebral disc degeneration, lumbar region: Secondary | ICD-10-CM | POA: Diagnosis not present

## 2020-05-10 ENCOUNTER — Other Ambulatory Visit: Payer: Self-pay | Admitting: Neurosurgery

## 2020-05-11 ENCOUNTER — Other Ambulatory Visit: Payer: Self-pay

## 2020-05-11 ENCOUNTER — Encounter: Payer: Self-pay | Admitting: Family Medicine

## 2020-05-11 ENCOUNTER — Ambulatory Visit (INDEPENDENT_AMBULATORY_CARE_PROVIDER_SITE_OTHER): Payer: Medicare HMO | Admitting: Family Medicine

## 2020-05-11 VITALS — BP 133/85 | HR 86 | Temp 98.7°F | Ht 72.0 in | Wt 283.0 lb

## 2020-05-11 DIAGNOSIS — M5136 Other intervertebral disc degeneration, lumbar region: Secondary | ICD-10-CM | POA: Diagnosis not present

## 2020-05-11 MED ORDER — MORPHINE SULFATE 15 MG PO TABS: 15.0000 mg | ORAL_TABLET | ORAL | 0 refills | Status: DC | PRN

## 2020-05-11 NOTE — Progress Notes (Signed)
BP 133/85   Pulse 86   Temp 98.7 F (37.1 C)   Ht 6' (1.829 m)   Wt 283 lb (128.4 kg)   SpO2 97%   BMI 38.38 kg/m    Subjective:   Patient ID: Brian Harrington, male    DOB: 1946-02-17, 74 y.o.   MRN: [id number]  HPI: Brian Harrington is a 74 y.o. male presenting on 05/11/2020 for Back Pain (lower back pain. Scheduled to have surgery on the 25th) and head ache   HPI Pain assessment: Cause of pain-degenerative disc disease and spine Pain location-lower back down legs Pain on scale of 1-10-4 out of 10 Frequency-Daily What increases pain-prolonged movement and exercise. What makes pain Better-medication and Soma helps Effects on ADL -limits ability to work or long for long periods of time. Any change in general medical condition-none  Current opioids rx-Percocet 10-3 25 3  times daily as needed and Soma 350 mg 3 times daily as needed # meds rx-70 Percocet and 90 Soma Effectiveness of current meds-working well Adverse reactions form pain meds-none Morphine equivalent-45  Pill count performed-No Last drug screen -1 year ago ( high risk q23m moderate risk q672mlow risk yearly ) Urine drug screen today- Yes [place]  If yes were their any concerning findings? -None   Overdose risk: 170 No flowsheet data found.   Pain contract signed on: Today  Relevant past medical, surgical, family and social history reviewed and updated as indicated. Interim medical history since our last visit reviewed. Allergies and medications reviewed and updated.  Review of Systems  Constitutional: Negative for chills and fever.  Respiratory: Negative for shortness of breath and wheezing.   Cardiovascular: Negative for chest pain and leg swelling.  Musculoskeletal: Positive for arthralgias and back pain. Negative for gait problem.  Skin: Negative for rash.  All other systems reviewed and are negative.   Per HPI unless specifically indicated above   Allergies as of 05/11/2020       Reactions   Amlodipine Besylate Shortness Of Breath, Swelling   tongue and facial swelling, difficulty breathing   Morphine And Related Itching   Sulfonamide Derivatives Nausea And Vomiting      Medication List       Accurate as of May 11, 2020  3:23 PM. If you have any questions, ask your nurse or doctor.        albuterol (2.5 MG/3ML) 0.083% nebulizer solution Commonly known as: PROVENTIL Take 3 mLs (2.5 mg total) by nebulization every 4 (four) hours as needed for wheezing or shortness of breath.   b complex vitamins tablet Take 1 tablet by mouth daily.   budesonide-formoterol 80-4.5 MCG/ACT inhaler Commonly known as: Symbicort Take 2 puffs first thing in am and then another 2 puffs about 12 hours later.   CALCIUM-VITAMIN D PO Take 1 tablet by mouth daily.   carisoprodol 350 MG tablet Commonly known as: SOMA Take 1 tablet (350 mg total) by mouth 3 (three) times daily as needed. for muscle spams   cetirizine 10 MG tablet Commonly known as: ZYRTEC Take 1 tablet (10 mg total) by mouth daily.   Combivent Respimat 20-100 MCG/ACT Aers respimat Generic drug: Ipratropium-Albuterol USE 1 PUFF EVERY 4 HOURS AS NEEDED What changed: See the new instructions.   famotidine 20 MG tablet Commonly known as: Pepcid One after breakfast What changed:   how much to take  how to take this  when to take this   fluticasone 50 MCG/ACT nasal  spray Commonly known as: FLONASE Place 1 spray into both nostrils 2 (two) times daily as needed for allergies or rhinitis.   Goodys Extra Strength [id number] MG Pack Generic drug: Aspirin-Acetaminophen-Caffeine Take 1 Package by mouth daily as needed (pain).   metoprolol tartrate 25 MG tablet Commonly known as: LOPRESSOR Take 1 tablet (25 mg total) by mouth 2 (two) times daily.   morphine 15 MG tablet Commonly known as: MSIR Take 1 tablet (15 mg total) by mouth as needed for severe pain. Take 1 with Percocet as needed until after  surgery. Started by: Fransisca Kaufmann Dettinger, MD   mupirocin cream 2 % Commonly known as: Bactroban Apply 1 application topically 2 (two) times daily.   Narcan 4 MG/0.1ML Liqd nasal spray kit Generic drug: naloxone USE 1 SPRAY INTO THE NOSE ONCE FOR 1 DOSE What changed: See the new instructions.   oxyCODONE-acetaminophen 10-325 MG tablet Commonly known as: PERCOCET Take 1 tablet by mouth every 8 (eight) hours as needed for pain.   OXYGEN Inhale 2 L into the lungs as needed (short of breath). 2 liters as needed   pantoprazole 40 MG tablet Commonly known as: PROTONIX TAKE ONE (1) TABLET EACH DAY What changed:   how much to take  how to take this  when to take this   sildenafil 20 MG tablet Commonly known as: REVATIO Take 1-3 tablets (20-60 mg total) by mouth daily as needed.   Spiriva HandiHaler 18 MCG inhalation capsule Generic drug: tiotropium Place 1 capsule (18 mcg total) into inhaler and inhale daily. What changed:   when to take this  reasons to take this   zinc gluconate 50 MG tablet Take 50 mg by mouth daily.        Objective:   BP 133/85   Pulse 86   Temp 98.7 F (37.1 C)   Ht 6' (1.829 m)   Wt 283 lb (128.4 kg)   SpO2 97%   BMI 38.38 kg/m   Wt Readings from Last 3 Encounters:  05/11/20 283 lb (128.4 kg)  11/11/19 269 lb 3.2 oz (122.1 kg)  08/12/19 254 lb 3.2 oz (115.3 kg)    Physical Exam Vitals and nursing note reviewed.  Constitutional:      General: He is not in acute distress.    Appearance: He is well-developed. He is not diaphoretic.  Eyes:     General: No scleral icterus.       Right eye: No discharge.     Conjunctiva/sclera: Conjunctivae normal.     Pupils: Pupils are equal, round, and reactive to light.  Neck:     Thyroid: No thyromegaly.  Cardiovascular:     Rate and Rhythm: Normal rate and regular rhythm.     Heart sounds: Normal heart sounds. No murmur.  Pulmonary:     Effort: Pulmonary effort is normal. No respiratory  distress.     Breath sounds: Normal breath sounds. No wheezing.  Musculoskeletal:        General: Tenderness (Low back pain) present.  Skin:    General: Skin is warm and dry.     Findings: No rash.  Neurological:     Mental Status: He is alert and oriented to person, place, and time.     Coordination: Coordination normal.  Psychiatric:        Behavior: Behavior normal.       Assessment & Plan:   Problem List Items Addressed This Visit      Musculoskeletal and Integument  Degenerative disc disease, lumbar - Primary   Relevant Medications   morphine (MSIR) 15 MG tablet   Other Relevant Orders   ToxASSURE Select 13 (MW), Urine      Will give a MS Contin prescription that he can use no more than 1 a day along with his Percocet until he gets through his surgery and to help post surgery as well and will go back to previous dose of Percocet after surgery. Follow up plan: Return in about 4 weeks (around 06/08/2020), or if symptoms worsen or fail to improve, for Degenerative disc disease follow-up.  Counseling provided for all of the vaccine components Orders Placed This Encounter  Procedures  . ToxASSURE Select 13 (MW), Urine    Caryl Pina, MD [facility] 05/11/2020, 3:23 PM

## 2020-05-17 ENCOUNTER — Telehealth: Payer: Self-pay | Admitting: Family Medicine

## 2020-05-17 LAB — TOXASSURE SELECT 13 (MW), URINE

## 2020-05-17 NOTE — Telephone Encounter (Signed)
Okay that would definitely explain it if he was given a diazepam shot, that would be consistent, we will continue to follow him in the future then.  Like I had thought he had been pretty consistent so that I was surprised but I just needed to know exactly where it had come from

## 2020-05-17 NOTE — Telephone Encounter (Signed)
PATIENT CALLED BACK AND STATES HE WAS GIVEN DIAZEPAM SHOT PRIOR TO AN MRI OF BACK, HE IS ABOUT TO HAVE SURGERY.

## 2020-05-17 NOTE — Telephone Encounter (Signed)
PATIENT HAS BEEN MADE AWARE OF LAB RESULTS

## 2020-05-17 NOTE — Telephone Encounter (Signed)
Erroneous note

## 2020-05-18 ENCOUNTER — Ambulatory Visit (INDEPENDENT_AMBULATORY_CARE_PROVIDER_SITE_OTHER): Payer: Medicare HMO | Admitting: Internal Medicine

## 2020-05-18 ENCOUNTER — Other Ambulatory Visit: Payer: Self-pay

## 2020-05-18 ENCOUNTER — Other Ambulatory Visit (HOSPITAL_COMMUNITY)
Admission: RE | Admit: 2020-05-18 | Discharge: 2020-05-18 | Disposition: A | Discharge status: Home / Self Care | Payer: Medicare HMO | Source: Ambulatory Visit | Attending: Neurosurgery | Admitting: Neurosurgery

## 2020-05-18 ENCOUNTER — Ambulatory Visit (HOSPITAL_COMMUNITY)
Admission: RE | Admit: 2020-05-18 | Discharge: 2020-05-18 | Disposition: A | Discharge status: Home / Self Care | Payer: Medicare HMO | Source: Ambulatory Visit | Attending: Internal Medicine | Admitting: Internal Medicine

## 2020-05-18 ENCOUNTER — Encounter: Payer: Self-pay | Admitting: Internal Medicine

## 2020-05-18 DIAGNOSIS — R0600 Dyspnea, unspecified: Secondary | ICD-10-CM | POA: Diagnosis not present

## 2020-05-18 DIAGNOSIS — R0609 Other forms of dyspnea: Secondary | ICD-10-CM

## 2020-05-18 DIAGNOSIS — R05 Cough: Secondary | ICD-10-CM

## 2020-05-18 DIAGNOSIS — J449 Chronic obstructive pulmonary disease, unspecified: Secondary | ICD-10-CM | POA: Insufficient documentation

## 2020-05-18 DIAGNOSIS — K219 Gastro-esophageal reflux disease without esophagitis: Secondary | ICD-10-CM | POA: Diagnosis not present

## 2020-05-18 DIAGNOSIS — R0602 Shortness of breath: Secondary | ICD-10-CM | POA: Insufficient documentation

## 2020-05-18 DIAGNOSIS — Z01812 Encounter for preprocedural laboratory examination: Secondary | ICD-10-CM | POA: Diagnosis not present

## 2020-05-18 DIAGNOSIS — Z20822 Contact with and (suspected) exposure to covid-19: Secondary | ICD-10-CM | POA: Diagnosis not present

## 2020-05-18 DIAGNOSIS — R058 Other specified cough: Secondary | ICD-10-CM

## 2020-05-18 LAB — SARS CORONAVIRUS 2 (TAT 6-24 HRS): SARS Coronavirus 2: NEGATIVE

## 2020-05-18 MED ORDER — METHYLPREDNISOLONE ACETATE 40 MG/ML IJ SUSP
120.0000 mg | Freq: Once | INTRAMUSCULAR | Status: AC
Start: 2020-05-18 — End: 2020-05-18
  Administered 2020-05-18: 120 mg via INTRAMUSCULAR

## 2020-05-18 NOTE — Telephone Encounter (Signed)
Aware of provider's statement regarding mediciine.

## 2020-05-18 NOTE — Progress Notes (Signed)
Brian Harrington, male    DOB: 1946/11/08     MRN: [id number]    Brief patient profile:  46 yowm quit smoking 1992  With some doe at that point  improved but since  around 2015 gradually worse sob/cough > [facility] dx copd rx saba plus another inhaler "something else"  helped some then placed on 02 around 2016 at 2lpm  referred to pulmonary clinic 10/01/2018 by Dr   Dettinger for refractory cough / sob  / sarcoid but no evidence of copd at initial eval     History of Present Illness  10/01/2018 Pulmonary / new pt eval / Brian Harrington   Re copd / thoroughly confused with meds  Chief Complaint  Patient presents with  . Pulmonary Consult    Referred by Dr. 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 Pt c/o cough "since the weather got hot"- at times he produces large amounts of yellow sputum.    Dyspnea:  avg day can do 2 aisles at [employer] on 02  Cough: more than usual worse p arising /worse p incruse and hot weather > mostly beige color x sev tbsp per day Sleep: flat bed 30 degrees hob using pillows  SABA use: combivent/neb variably better p use, not really using neb often "just with colds' Taking symbicort 160 x 2  First thing in am then Incruse lunchtime  Some better with pred in past and just got another rx but hasn't started  rec Plan A = Automatic = Symbicort 80 Take 2 puffs first thing in am and then another 2 puffs about 12 hours later.  Stop Incruse  Plan B = Backup Only use your combivent  as a rescue medication   Plan C = Crisis - only use your albuterol nebulizer if you first try Plan B and it fails to help > ok to use the nebulizer up to every 4 hours but if start needing it regularly call for immediate appointment Augmentin 875 mg take one pill twice daily  X 10 days   Prednisone take it per Dr D Pepcid 20 mg after breakfast and continue protonix before supper  GERD  Diet  Please remember to go to the lab and x-ray department downstairs in the basement  for your tests - we will call you with  the results when they are available.  Please schedule a follow up office visit in 4 weeks, call sooner if needed with all medications /inhalers/ solutions in hand so we can verify exactly what you are taking     11/02/2018  Extended summary f/u ov/Brian Harrington re:  Sob ? All upper airway / did not bring any meds  Chief Complaint  Patient presents with  . Follow-up    PFT's done today. Breathing is about the same. He is coughing less- non prod. He is using his combivent inhaler 2-3 x per day. He is using his neb about once per wk.   Dyspnea:  MMRC3 = can't walk 100 yards even at a slow pace at a flat grade s stopping due to sob   Cough: much better unless around perfume/  Sleeping:  Bed flat / wedge pillow o/w overt gerd SABA use: as above 02: none at hs/ sometimes daytime   Last time could tol brisk walk =  several years prior to OV   rec Pantoprazole (protonix) 40 mg   Take  30-60 min before first meal of the day and Pepcid (famotidine)  20 mg one after supper or  prior to bedtime until return to office - this is the best way to tell whether stomach acid is contributing to your problem.   GERD diet  Continue symbicort 80 Take 2 puffs first thing in am and then another 2 puffs about 12 hours later.  Only combivent use the combivent to catch your breath To get the most out of exercise, you need to be continuously aware that you are short of breath, but never out of breath, for 30 minutes daily.    05/19/2019  f/u ov/Brian Harrington re: cough variant asthma/ uacs  Obesity/ deconditioning Chief Complaint  Patient presents with  . Follow-up    He states his breathing continues at his baseline, no new concerns. He needs a new nebulizer supplies.   Dyspnea:  MMRC2 = can't walk a nl pace on a flat grade s sob but does fine slow and flat 3 Cough: none Sleeping: on side falt bed SABA use: none 02: none  rec Use combivent one puff 15 min before activity to see what difference it makes Pt has my chart and needs  appt next available slot with all meds/ inhalers in hand    05/18/2020  Consultation/ pre-op clearance  ov/Brian Harrington re: obesity/ uacs  ? Asthma component / fully vaccinated for Covid 19 Chief Complaint  Patient presents with  . Follow-up    Needing pulmonary clearance for back surgery. He states his breathing has been worse "when I go outside, the pollen".   He c/o wheezing and occ cough- prod and unsure of sputum color. He uses his albuterol inhaler and neb both several x per day.    Dyspnea:  Does fine inside grocery store, more limited by back / breathing pollen /dust makes it worse Cough: triggered by  dpi spiriva / cologne / drinks water in am to clear  Sleeping: on side bed is flat SABA use: using neb one half in am and lots of saba hfa 02: has tanks prn  Has overt HB, uses ppi and pepcid uses one of each in am only    No obvious day to day or daytime variability or assoc excess/ purulent sputum or mucus plugs or hemoptysis or cp or chest tightness, subjective wheeze  .   Sleeping ok as above  without nocturnal  or early am exacerbation  of respiratory  c/o's or need for noct saba. Also denies any obvious fluctuation of symptoms with weather or environmental changes or other aggravating or alleviating factors except as outlined above   No unusual exposure hx or h/o childhood pna/ asthma or knowledge of premature birth.  Current Allergies, Complete Past Medical History, Past Surgical History, Family History, and Social History were reviewed in [facility] record.  ROS  The following are not active complaints unless bolded Hoarseness, sore throat/globus sensation, dysphagia, dental problems, itching, sneezing,  nasal congestion or discharge of excess mucus or purulent secretions, ear ache,   fever, chills, sweats, unintended wt loss or wt gain, classically pleuritic or exertional cp,  orthopnea pnd or arm/hand swelling  or leg swelling, presyncope, palpitations,  abdominal pain, anorexia, nausea, vomiting, diarrhea  or change in bowel habits or change in bladder habits, change in stools or change in urine, dysuria, hematuria,  rash, arthralgias, visual complaints, headache, numbness, weakness or ataxia or problems with walking or coordination,  change in mood or  memory.        Current Meds  Medication Sig  . albuterol (PROVENTIL) (2.5 MG/3ML) 0.083% nebulizer solution  Take 3 mLs (2.5 mg total) by nebulization every 4 (four) hours as needed for wheezing or shortness of breath.  . Aspirin-Acetaminophen-Caffeine (GOODYS EXTRA STRENGTH) [phone] MG PACK Take 1 Package by mouth daily as needed (pain).  Marland Kitchen b complex vitamins tablet Take 1 tablet by mouth daily.  . budesonide-formoterol (SYMBICORT) 80-4.5 MCG/ACT inhaler Take 2 puffs first thing in am and then another 2 puffs about 12 hours later.  Marland Kitchen CALCIUM-VITAMIN D PO Take 1 tablet by mouth daily.  . carisoprodol (SOMA) 350 MG tablet Take 1 tablet (350 mg total) by mouth 3 (three) times daily as needed. for muscle spams  . cetirizine (ZYRTEC) 10 MG tablet Take 1 tablet (10 mg total) by mouth daily.  . COMBIVENT RESPIMAT 20-100 MCG/ACT AERS respimat USE 1 PUFF EVERY 4 HOURS AS NEEDED (Patient taking differently: Inhale 2 puffs into the lungs every 4 (four) hours as needed for wheezing or shortness of breath. )  . famotidine (PEPCID) 20 MG tablet One after breakfast (Patient taking differently: Take 20 mg by mouth daily. One after breakfast)  . fluticasone (FLONASE) 50 MCG/ACT nasal spray Place 1 spray into both nostrils 2 (two) times daily as needed for allergies or rhinitis.  . metoprolol tartrate (LOPRESSOR) 25 MG tablet Take 1 tablet (25 mg total) by mouth 2 (two) times daily.  Marland Kitchen morphine (MSIR) 15 MG tablet Take 1 tablet (15 mg total) by mouth as needed for severe pain. Take 1 with Percocet as needed until after surgery.  . mupirocin cream (BACTROBAN) 2 % Apply 1 application topically 2 (two) times daily.   Marland Kitchen NARCAN 4 MG/0.1ML LIQD nasal spray kit USE 1 SPRAY INTO THE NOSE ONCE FOR 1 DOSE (Patient taking differently: Place 1 spray into the nose once. )  . oxyCODONE-acetaminophen (PERCOCET) 10-325 MG tablet Take 1 tablet by mouth every 8 (eight) hours as needed for pain.  . OXYGEN Inhale 2 L into the lungs as needed (short of breath). 2 liters as needed   . pantoprazole (PROTONIX) 40 MG tablet TAKE ONE (1) TABLET EACH DAY (Patient taking differently: Take 40 mg by mouth daily. TAKE ONE (1) TABLET EACH DAY)  . sildenafil (REVATIO) 20 MG tablet Take 1-3 tablets (20-60 mg total) by mouth daily as needed.  . tiotropium (SPIRIVA HANDIHALER) 18 MCG inhalation capsule  rarely uses  . zinc gluconate 50 MG tablet Take 50 mg by mouth daily.                              Objective:    amb obese wm gravely voice texture/ dry cough on voluntary maneuver and prominent pseudowheeze/  05/18/2020       282  05/19/2019       269   11/02/18 245 lb (111.1 kg)  10/01/18 242 lb (109.8 kg)  09/08/18 239 lb 3.2 oz (108.5 kg)     Vital signs reviewed  05/18/2020  - Note at rest 02 sats  98% on RA     reports full dentures    HEENT : pt wearing mask not removed for exam due to covid -19 concerns.    NECK :  without JVD/Nodes/TM/ nl carotid upstrokes bilaterally   LUNGS: no acc muscle use,  Nl contour chest which is clear to A and P bilaterally without cough on insp or exp maneuvers   CV:  RRR  no s3 or murmur or increase in P2, and no edema  ABD: quit obese  soft and nontender with nl inspiratory excursion in the supine position. No bruits or organomegaly appreciated, bowel sounds nl  MS:  Nl gait/ ext warm without deformities, calf tenderness, cyanosis or clubbing No obvious joint restrictions   SKIN: warm and dry without lesions    NEURO:  alert, approp, nl sensorium with  no motor or cerebellar deficits apparent.      CXR PA and Lateral:   05/18/2020 :    I personally reviewed images and  agree with radiology impression as follows:    1. Interval borderline cardiomegaly. 2. Stable mild changes of COPD and chronic bronchitis    Assessment

## 2020-05-18 NOTE — Patient Instructions (Addendum)
Change protonix for now :  Take 30- 60 min before your first and last meals of the day   Continue famotidine but use it at bedtime only   GERD (REFLUX)  is an extremely common cause of respiratory symptoms just like yours , many times with no obvious heartburn at all.    It can be treated with medication, but also with lifestyle changes including elevation of the head of your bed (ideally with 6 -8inch blocks under the headboard of your bed),  Smoking cessation, avoidance of late meals, excessive alcohol, and avoid fatty foods, chocolate, peppermint, colas, red wine, and acidic juices such as orange juice.  NO MINT OR MENTHOL PRODUCTS SO NO COUGH DROPS  USE SUGARLESS CANDY INSTEAD (Jolley ranchers or Stover's or Life Savers) or even ice chips will also do - the key is to swallow to prevent all throat clearing. NO OIL BASED VITAMINS - use powdered substitutes.  Avoid fish oil when coughing.    Plan A = Automatic = Always=    symbicort 80 Take 2 puffs first thing in am and then another 2 puffs about 12 hours later.   Work on inhaler technique:  relax and gently blow all the way out then take a nice smooth deep breath back in, triggering the inhaler at same time you start breathing in.  Hold for up to 5 seconds if you can. Blow out thru nose. Rinse and gargle with water when done      Plan B = Backup (to supplement plan A, not to replace it) Only use your combivent  inhaler as a rescue medication to be used if you can't catch your breath by resting or doing a relaxed purse lip breathing pattern.  - The less you use it, the better it will work when you need it. - Ok to use the inhaler up to 1 puffs  every 4 hours if you must but call for appointment if use goes up over your usual need - Don't leave home without it !!  (think of it like the spare tire for your car)   Plan C = Crisis (instead of Plan B but only if Plan B stops working) - only use your albuterol nebulizer if you first try Plan B  and it fails to help > ok to use the nebulizer up to every 4 hours but if start needing it regularly call for immediate appointment  Depomedrol 120 mg IM today   Take your nebulizer right before you head   for surgery  Please remember to go to the  x-ray department  At the hosital for your tests - we will call you with the results when they are available    Please schedule a follow up office visit in 6 weeks, call sooner if needed with pfts on returrn

## 2020-05-20 ENCOUNTER — Encounter: Payer: Self-pay | Admitting: Internal Medicine

## 2020-05-20 MED ORDER — FAMOTIDINE 20 MG PO TABS: ORAL_TABLET | ORAL | 3 refills | Status: DC

## 2020-05-20 NOTE — Assessment & Plan Note (Addendum)
Quit smoking 1992  Spirometry 10/01/2018  FEV1 1.9 (52%)  Ratio 83 s curvature off all rx   - 10/01/2018  After extensive coaching inhaler device,  effectiveness =    75% with hfa > reduced symbicort to 80 2bid as cough is the main concern - [facility] AT screen  10/01/18  MM/ level 185  - PFT's  11/02/2018  FEV1 2.46 (77 % ) ratio 78  p 4 % improvement from saba p nothing prior to study with DLCO  67/70c % corrects to 89 % for alv volume   - 05/19/2019  After extensive coaching inhaler device,  effectiveness =    75% > continue symb 80 2bid and d/c spiriva as doesn't use it properly anyway and may contribute to cough.  He has no significant copd but rather better characterized by AB and should do fine on symb 80 2bid periop as long as he manages gerd aggressively Algis Downs

## 2020-05-20 NOTE — Assessment & Plan Note (Signed)
Onset 2015  10/01/2018   Walked RA x one lap @ 185 stopped due to  Sob no desats slow pace  11/02/2018  Walked RA x 3 laps @ 185 ft each stopped due to  End of study, nl pace, sats 96% at end  / min sob    -  05/18/2020   Walked RA  approx   400 ft  @ mod pace  stopped due to  Sob at end but sats still 98%   No need for 02 at rest or walking at this point   Based on obesity may need 02/bipap bridge post op / advised  Discussed in detail all the  indications, usual  risks and alternatives  relative to the benefits with patient who agrees to proceed with Rx as outlined and back surgery as planned with pulmonary f/u prn periop          Each maintenance medication was reviewed in detail including emphasizing most importantly the difference between maintenance and prns and under what circumstances the prns are to be triggered using an action plan format where appropriate.  Total time for H and P, chart review, counseling, teaching device/ directly observing portions of ambulatory 02 saturation study/  and generating customized AVS unique to this office visit / charting = 60 min

## 2020-05-20 NOTE — Assessment & Plan Note (Signed)
Allergy profile 10/01/2018 >  Eos 0.4/  IgE 182  RAST pos dust only    - max rx for gerd 10/01/2018    Reminded pt:  Of the three most common causes of  Sub-acute / recurrent or chronic cough, only one (GERD)  can actually contribute to/ trigger  the other two (asthma and post nasal drip syndrome)  and perpetuate the cylce of cough.  While not intuitively obvious, many patients with chronic low grade reflux do not cough until there is a primary insult that disturbs the protective epithelial barrier and exposes sensitive nerve endings.   This is typically viral but can due to PNDS and  either may apply here.    >>>  The point is that once this occurs, it is difficult to eliminate the cycle  using anything but a maximally effective acid suppression regimen at least in the short run, accompanied by an appropriate diet to address non acid GERD  > advised to take ppi bid ac and pepcid 20 mg hs thru surgery and f/u here in 6 weeks to regroup.

## 2020-05-21 ENCOUNTER — Encounter (HOSPITAL_COMMUNITY): Payer: Self-pay | Admitting: Neurosurgery

## 2020-05-21 MED ORDER — DEXTROSE 5 % IV SOLN
3.0000 g | INTRAVENOUS | Status: AC
  Administered 2020-05-22: 3 g via INTRAVENOUS
  Filled 2020-05-21: qty 3000
  Filled 2020-05-21: qty 3

## 2020-05-21 NOTE — Anesthesia Preprocedure Evaluation (Addendum)
Anesthesia Evaluation  Patient identified by MRN, date of birth, ID band Patient awake    Reviewed: Allergy & Precautions, H&P , NPO status , Patient's Chart, lab work & pertinent test results  Airway Mallampati: II  TM Distance: >3 FB Neck ROM: Full    Dental no notable dental hx. (+) Upper Dentures, Lower Dentures, Dental Advisory Given   Pulmonary sleep apnea , COPD,  oxygen dependent, former smoker,    Pulmonary exam normal breath sounds clear to auscultation       Cardiovascular Exercise Tolerance: Good hypertension, Pt. on medications and Pt. on home beta blockers + CAD and + Past MI   Rhythm:Regular Rate:Normal     Neuro/Psych Anxiety negative neurological ROS     GI/Hepatic Neg liver ROS, GERD  Medicated,  Endo/Other  Morbid obesity  Renal/GU negative Renal ROS  negative genitourinary   Musculoskeletal  (+) Arthritis , Osteoarthritis,    Abdominal   Peds  Hematology negative hematology ROS (+)   Anesthesia Other Findings   Reproductive/Obstetrics negative OB ROS                            Anesthesia Physical Anesthesia Plan  ASA: III  Anesthesia Plan: General   Post-op Pain Management:    Induction: Intravenous  PONV Risk Score and Plan: 3 and Ondansetron, Dexamethasone and Midazolam  Airway Management Planned: Oral ETT  Additional Equipment:   Intra-op Plan:   Post-operative Plan: Extubation in OR  Informed Consent: I have reviewed the patients History and Physical, chart, labs and discussed the procedure including the risks, benefits and alternatives for the proposed anesthesia with the patient or authorized representative who has indicated his/her understanding and acceptance.     Dental advisory given  Plan Discussed with: CRNA  Anesthesia Plan Comments:         Anesthesia Quick Evaluation

## 2020-05-21 NOTE — Progress Notes (Signed)
Spoke to patient and gave pre op instructions. Patient verbalized understanding. Also informed patient to continue to quarantine until surgery.

## 2020-05-21 NOTE — Progress Notes (Signed)
Spoke with pt and notified of results per Dr. Wert. Pt verbalized understanding and denied any questions. 

## 2020-05-22 ENCOUNTER — Ambulatory Visit (HOSPITAL_COMMUNITY): Payer: Medicare HMO | Admitting: Anesthesiology

## 2020-05-22 ENCOUNTER — Encounter (HOSPITAL_COMMUNITY)
Admission: RE | Disposition: A | Discharge status: Home / Self Care | Payer: Self-pay | Source: Ambulatory Visit | Attending: Neurosurgery

## 2020-05-22 ENCOUNTER — Ambulatory Visit (HOSPITAL_COMMUNITY): Payer: Medicare HMO

## 2020-05-22 ENCOUNTER — Encounter (HOSPITAL_COMMUNITY): Payer: Self-pay | Admitting: Neurosurgery

## 2020-05-22 ENCOUNTER — Other Ambulatory Visit: Payer: Self-pay

## 2020-05-22 ENCOUNTER — Observation Stay (HOSPITAL_COMMUNITY)
Admission: RE | Admit: 2020-05-22 | Discharge: 2020-05-23 | Disposition: A | Discharge status: Home / Self Care | Payer: Medicare HMO | Source: Ambulatory Visit | Attending: Neurosurgery | Admitting: Neurosurgery

## 2020-05-22 DIAGNOSIS — Z6838 Body mass index (BMI) 38.0-38.9, adult: Secondary | ICD-10-CM | POA: Diagnosis not present

## 2020-05-22 DIAGNOSIS — G4730 Sleep apnea, unspecified: Secondary | ICD-10-CM | POA: Insufficient documentation

## 2020-05-22 DIAGNOSIS — M48061 Spinal stenosis, lumbar region without neurogenic claudication: Secondary | ICD-10-CM | POA: Diagnosis not present

## 2020-05-22 DIAGNOSIS — I10 Essential (primary) hypertension: Secondary | ICD-10-CM | POA: Diagnosis not present

## 2020-05-22 DIAGNOSIS — Z888 Allergy status to other drugs, medicaments and biological substances status: Secondary | ICD-10-CM | POA: Insufficient documentation

## 2020-05-22 DIAGNOSIS — Z9981 Dependence on supplemental oxygen: Secondary | ICD-10-CM | POA: Diagnosis not present

## 2020-05-22 DIAGNOSIS — Z981 Arthrodesis status: Secondary | ICD-10-CM | POA: Diagnosis not present

## 2020-05-22 DIAGNOSIS — M4316 Spondylolisthesis, lumbar region: Secondary | ICD-10-CM | POA: Diagnosis not present

## 2020-05-22 DIAGNOSIS — Z882 Allergy status to sulfonamides status: Secondary | ICD-10-CM | POA: Diagnosis not present

## 2020-05-22 DIAGNOSIS — Z0389 Encounter for observation for other suspected diseases and conditions ruled out: Secondary | ICD-10-CM | POA: Diagnosis not present

## 2020-05-22 DIAGNOSIS — K219 Gastro-esophageal reflux disease without esophagitis: Secondary | ICD-10-CM | POA: Insufficient documentation

## 2020-05-22 DIAGNOSIS — I2510 Atherosclerotic heart disease of native coronary artery without angina pectoris: Secondary | ICD-10-CM | POA: Insufficient documentation

## 2020-05-22 DIAGNOSIS — Z7951 Long term (current) use of inhaled steroids: Secondary | ICD-10-CM | POA: Insufficient documentation

## 2020-05-22 DIAGNOSIS — Z419 Encounter for procedure for purposes other than remedying health state, unspecified: Secondary | ICD-10-CM

## 2020-05-22 DIAGNOSIS — I252 Old myocardial infarction: Secondary | ICD-10-CM | POA: Insufficient documentation

## 2020-05-22 DIAGNOSIS — J449 Chronic obstructive pulmonary disease, unspecified: Secondary | ICD-10-CM | POA: Insufficient documentation

## 2020-05-22 DIAGNOSIS — M48062 Spinal stenosis, lumbar region with neurogenic claudication: Secondary | ICD-10-CM | POA: Insufficient documentation

## 2020-05-22 DIAGNOSIS — Z79899 Other long term (current) drug therapy: Secondary | ICD-10-CM | POA: Diagnosis not present

## 2020-05-22 DIAGNOSIS — M4310 Spondylolisthesis, site unspecified: Secondary | ICD-10-CM | POA: Diagnosis present

## 2020-05-22 DIAGNOSIS — Z885 Allergy status to narcotic agent status: Secondary | ICD-10-CM | POA: Insufficient documentation

## 2020-05-22 DIAGNOSIS — Z87891 Personal history of nicotine dependence: Secondary | ICD-10-CM | POA: Insufficient documentation

## 2020-05-22 DIAGNOSIS — M5136 Other intervertebral disc degeneration, lumbar region: Secondary | ICD-10-CM | POA: Diagnosis not present

## 2020-05-22 DIAGNOSIS — J441 Chronic obstructive pulmonary disease with (acute) exacerbation: Secondary | ICD-10-CM | POA: Diagnosis not present

## 2020-05-22 HISTORY — PX: ANTERIOR LATERAL LUMBAR FUSION WITH PERCUTANEOUS SCREW 1 LEVEL: SHX5553

## 2020-05-22 LAB — BASIC METABOLIC PANEL
Anion gap: 11 (ref 5–15)
BUN: 20 mg/dL (ref 8–23)
CO2: 27 mmol/L (ref 22–32)
Calcium: 8.6 mg/dL — ABNORMAL LOW (ref 8.9–10.3)
Chloride: 100 mmol/L (ref 98–111)
Creatinine, Ser: 0.98 mg/dL (ref 0.61–1.24)
GFR calc Af Amer: >60 mL/min (ref >60)
GFR calc non Af Amer: >60 mL/min (ref >60)
Glucose, Bld: 110 mg/dL — ABNORMAL HIGH (ref 70–99)
Potassium: 3.6 mmol/L (ref 3.5–5.1)
Sodium: 138 mmol/L (ref 135–145)

## 2020-05-22 LAB — CBC WITH DIFFERENTIAL/PLATELET
Abs Immature Granulocytes: 0.02 10*3/uL (ref 0.00–0.07)
Basophils Absolute: 0.1 10*3/uL (ref 0.0–0.1)
Basophils Relative: 1 %
Eosinophils Absolute: 0.1 10*3/uL (ref 0.0–0.5)
Eosinophils Relative: 2 %
HCT: 41.6 % (ref 39.0–52.0)
Hemoglobin: 13.1 g/dL (ref 13.0–17.0)
Immature Granulocytes: 0 %
Lymphocytes Relative: 27 %
Lymphs Abs: 1.6 10*3/uL (ref 0.7–4.0)
MCH: 26.3 pg (ref 26.0–34.0)
MCHC: 31.5 g/dL (ref 30.0–36.0)
MCV: 83.5 fL (ref 80.0–100.0)
Monocytes Absolute: 0.6 10*3/uL (ref 0.1–1.0)
Monocytes Relative: 10 %
Neutro Abs: 3.7 10*3/uL (ref 1.7–7.7)
Neutrophils Relative %: 60 %
Platelets: 197 10*3/uL (ref 150–400)
RBC: 4.98 MIL/uL (ref 4.22–5.81)
RDW: 14.7 % (ref 11.5–15.5)
WBC: 6.2 10*3/uL (ref 4.0–10.5)
nRBC: 0 % (ref 0.0–0.2)

## 2020-05-22 LAB — TYPE AND SCREEN
ABO/RH(D): O POS
Antibody Screen: NEGATIVE

## 2020-05-22 LAB — GLUCOSE, CAPILLARY: Glucose-Capillary: 109 mg/dL — ABNORMAL HIGH (ref 70–99)

## 2020-05-22 LAB — ABO/RH: ABO/RH(D): O POS

## 2020-05-22 SURGERY — ANTERIOR LATERAL LUMBAR FUSION WITH PERCUTANEOUS SCREW 1 LEVEL
Anesthesia: General | Laterality: Left

## 2020-05-22 MED ORDER — BISACODYL 10 MG RE SUPP: 10.0000 mg | Freq: Every day | RECTAL | Status: DC | PRN

## 2020-05-22 MED ORDER — THROMBIN 5000 UNITS EX SOLR
CUTANEOUS | Status: DC | PRN
  Administered 2020-05-22 (×2): 5000 [IU] via TOPICAL

## 2020-05-22 MED ORDER — HYDROMORPHONE HCL 1 MG/ML IJ SOLN
INTRAMUSCULAR | Status: AC
  Filled 2020-05-22: qty 1

## 2020-05-22 MED ORDER — PROPOFOL 10 MG/ML IV BOLUS
INTRAVENOUS | Status: DC | PRN
  Administered 2020-05-22: 140 mg via INTRAVENOUS

## 2020-05-22 MED ORDER — ONDANSETRON HCL 4 MG/2ML IJ SOLN
INTRAMUSCULAR | Status: DC | PRN
  Administered 2020-05-22: 4 mg via INTRAVENOUS

## 2020-05-22 MED ORDER — CALCIUM CARBONATE-VITAMIN D 500-200 MG-UNIT PO TABS
1.0000 | ORAL_TABLET | Freq: Every day | ORAL | Status: DC
  Administered 2020-05-23: 1 via ORAL
  Filled 2020-05-22: qty 1

## 2020-05-22 MED ORDER — FAMOTIDINE 20 MG PO TABS
20.0000 mg | ORAL_TABLET | Freq: Every day | ORAL | Status: DC
  Administered 2020-05-22: 20 mg via ORAL
  Filled 2020-05-22: qty 1

## 2020-05-22 MED ORDER — ONDANSETRON HCL 4 MG PO TABS: 4.0000 mg | ORAL_TABLET | Freq: Four times a day (QID) | ORAL | Status: DC | PRN

## 2020-05-22 MED ORDER — MORPHINE SULFATE 15 MG PO TABS: 15.0000 mg | ORAL_TABLET | ORAL | Status: DC | PRN

## 2020-05-22 MED ORDER — HEMOSTATIC AGENTS (NO CHARGE) OPTIME
TOPICAL | Status: DC | PRN
  Administered 2020-05-22: 1 via TOPICAL

## 2020-05-22 MED ORDER — OXYCODONE HCL 5 MG PO TABS
ORAL_TABLET | ORAL | Status: AC
  Filled 2020-05-22: qty 2

## 2020-05-22 MED ORDER — CEFAZOLIN SODIUM-DEXTROSE 1-4 GM/50ML-% IV SOLN
1.0000 g | Freq: Three times a day (TID) | INTRAVENOUS | Status: AC
  Administered 2020-05-22 (×2): 1 g via INTRAVENOUS
  Filled 2020-05-22 (×2): qty 50

## 2020-05-22 MED ORDER — FENTANYL CITRATE (PF) 250 MCG/5ML IJ SOLN
INTRAMUSCULAR | Status: AC
  Filled 2020-05-22: qty 5

## 2020-05-22 MED ORDER — HYDROMORPHONE HCL 1 MG/ML IJ SOLN
0.2500 mg | INTRAMUSCULAR | Status: DC | PRN
  Administered 2020-05-22 (×4): 0.5 mg via INTRAVENOUS

## 2020-05-22 MED ORDER — PANTOPRAZOLE SODIUM 40 MG PO TBEC
40.0000 mg | DELAYED_RELEASE_TABLET | Freq: Every day | ORAL | Status: DC
  Administered 2020-05-23: 40 mg via ORAL
  Filled 2020-05-22: qty 1

## 2020-05-22 MED ORDER — ONDANSETRON HCL 4 MG/2ML IJ SOLN: 4.0000 mg | Freq: Four times a day (QID) | INTRAMUSCULAR | Status: DC | PRN

## 2020-05-22 MED ORDER — SCOPOLAMINE 1 MG/3DAYS TD PT72
1.0000 | MEDICATED_PATCH | TRANSDERMAL | Status: DC
  Administered 2020-05-22: 1.5 mg via TRANSDERMAL
  Filled 2020-05-22: qty 1

## 2020-05-22 MED ORDER — ORAL CARE MOUTH RINSE: 15.0000 mL | Freq: Once | OROMUCOSAL | Status: AC

## 2020-05-22 MED ORDER — KETAMINE HCL 50 MG/5ML IJ SOSY
PREFILLED_SYRINGE | INTRAMUSCULAR | Status: AC
  Filled 2020-05-22: qty 5

## 2020-05-22 MED ORDER — HYDROMORPHONE HCL 1 MG/ML IJ SOLN
1.0000 mg | INTRAMUSCULAR | Status: DC | PRN
  Administered 2020-05-22: 1 mg via INTRAVENOUS
  Filled 2020-05-22: qty 1

## 2020-05-22 MED ORDER — ZINC GLUCONATE 50 MG PO TABS: 50.0000 mg | ORAL_TABLET | Freq: Every day | ORAL | Status: DC

## 2020-05-22 MED ORDER — POLYETHYLENE GLYCOL 3350 17 G PO PACK: 17.0000 g | PACK | Freq: Every day | ORAL | Status: DC | PRN

## 2020-05-22 MED ORDER — MIDAZOLAM HCL 2 MG/2ML IJ SOLN
INTRAMUSCULAR | Status: AC
  Filled 2020-05-22: qty 2

## 2020-05-22 MED ORDER — SODIUM CHLORIDE 0.9% FLUSH
3.0000 mL | Freq: Two times a day (BID) | INTRAVENOUS | Status: DC
  Administered 2020-05-22: 3 mL via INTRAVENOUS

## 2020-05-22 MED ORDER — DIAZEPAM 5 MG PO TABS
ORAL_TABLET | ORAL | Status: AC
  Filled 2020-05-22: qty 1

## 2020-05-22 MED ORDER — CHLORHEXIDINE GLUCONATE CLOTH 2 % EX PADS: 6.0000 | MEDICATED_PAD | Freq: Once | CUTANEOUS | Status: DC

## 2020-05-22 MED ORDER — SODIUM CHLORIDE 0.9 % IV SOLN
250.0000 mL | INTRAVENOUS | Status: DC
  Administered 2020-05-22: 250 mL via INTRAVENOUS

## 2020-05-22 MED ORDER — FENTANYL CITRATE (PF) 100 MCG/2ML IJ SOLN
INTRAMUSCULAR | Status: DC | PRN
  Administered 2020-05-22: 50 ug via INTRAVENOUS
  Administered 2020-05-22: 100 ug via INTRAVENOUS
  Administered 2020-05-22 (×2): 50 ug via INTRAVENOUS

## 2020-05-22 MED ORDER — MENTHOL 3 MG MT LOZG: 1.0000 | LOZENGE | OROMUCOSAL | Status: DC | PRN

## 2020-05-22 MED ORDER — ACETAMINOPHEN 500 MG PO TABS
1000.0000 mg | ORAL_TABLET | Freq: Once | ORAL | Status: AC
  Administered 2020-05-22: 1000 mg via ORAL
  Filled 2020-05-22: qty 2

## 2020-05-22 MED ORDER — IPRATROPIUM-ALBUTEROL 20-100 MCG/ACT IN AERS: 2.0000 | INHALATION_SPRAY | RESPIRATORY_TRACT | Status: DC | PRN

## 2020-05-22 MED ORDER — SODIUM CHLORIDE 0.9 % IV SOLN
INTRAVENOUS | Status: DC | PRN
  Administered 2020-05-22: 500 mL

## 2020-05-22 MED ORDER — DIAZEPAM 5 MG PO TABS
5.0000 mg | ORAL_TABLET | Freq: Four times a day (QID) | ORAL | Status: DC | PRN
  Administered 2020-05-22 – 2020-05-23 (×3): 5 mg via ORAL
  Filled 2020-05-22 (×2): qty 1

## 2020-05-22 MED ORDER — LACTATED RINGERS IV SOLN: INTRAVENOUS | Status: DC | PRN

## 2020-05-22 MED ORDER — PHENOL 1.4 % MT LIQD: 1.0000 | OROMUCOSAL | Status: DC | PRN

## 2020-05-22 MED ORDER — LIDOCAINE 2% (20 MG/ML) 5 ML SYRINGE
INTRAMUSCULAR | Status: DC | PRN
  Administered 2020-05-22: 60 mg via INTRAVENOUS

## 2020-05-22 MED ORDER — METOPROLOL TARTRATE 25 MG PO TABS
25.0000 mg | ORAL_TABLET | Freq: Two times a day (BID) | ORAL | Status: DC
  Administered 2020-05-22: 25 mg via ORAL
  Filled 2020-05-22: qty 1

## 2020-05-22 MED ORDER — FLEET ENEMA 7-19 GM/118ML RE ENEM: 1.0000 | ENEMA | Freq: Once | RECTAL | Status: DC | PRN

## 2020-05-22 MED ORDER — CARISOPRODOL 350 MG PO TABS
350.0000 mg | ORAL_TABLET | Freq: Three times a day (TID) | ORAL | Status: DC | PRN
  Administered 2020-05-23: 350 mg via ORAL
  Filled 2020-05-22: qty 1

## 2020-05-22 MED ORDER — FLUTICASONE PROPIONATE 50 MCG/ACT NA SUSP
1.0000 | Freq: Two times a day (BID) | NASAL | Status: DC | PRN
  Filled 2020-05-22: qty 16

## 2020-05-22 MED ORDER — BUPIVACAINE HCL (PF) 0.25 % IJ SOLN
INTRAMUSCULAR | Status: AC
  Filled 2020-05-22: qty 30

## 2020-05-22 MED ORDER — PROPOFOL 10 MG/ML IV BOLUS
INTRAVENOUS | Status: AC
  Filled 2020-05-22: qty 20

## 2020-05-22 MED ORDER — PHENYLEPHRINE HCL-NACL 10-0.9 MG/250ML-% IV SOLN
INTRAVENOUS | Status: DC | PRN
Start: 2020-05-22 — End: 2020-05-22
  Administered 2020-05-22: 25 ug/min via INTRAVENOUS

## 2020-05-22 MED ORDER — LORATADINE 10 MG PO TABS
10.0000 mg | ORAL_TABLET | Freq: Every day | ORAL | Status: DC
  Administered 2020-05-23: 10 mg via ORAL
  Filled 2020-05-22: qty 1

## 2020-05-22 MED ORDER — SODIUM CHLORIDE 0.9% FLUSH: 3.0000 mL | INTRAVENOUS | Status: DC | PRN

## 2020-05-22 MED ORDER — MIDAZOLAM HCL 5 MG/5ML IJ SOLN
INTRAMUSCULAR | Status: DC | PRN
  Administered 2020-05-22: 2 mg via INTRAVENOUS

## 2020-05-22 MED ORDER — PROPOFOL 500 MG/50ML IV EMUL
INTRAVENOUS | Status: DC | PRN
Start: 2020-05-22 — End: 2020-05-22
  Administered 2020-05-22: 50 ug/kg/min via INTRAVENOUS

## 2020-05-22 MED ORDER — ACETAMINOPHEN 650 MG RE SUPP: 650.0000 mg | RECTAL | Status: DC | PRN

## 2020-05-22 MED ORDER — MOMETASONE FURO-FORMOTEROL FUM 100-5 MCG/ACT IN AERO
2.0000 | INHALATION_SPRAY | Freq: Two times a day (BID) | RESPIRATORY_TRACT | Status: DC
  Administered 2020-05-22 – 2020-05-23 (×2): 2 via RESPIRATORY_TRACT
  Filled 2020-05-22: qty 8.8

## 2020-05-22 MED ORDER — ACETAMINOPHEN 325 MG PO TABS
650.0000 mg | ORAL_TABLET | ORAL | Status: DC | PRN
  Administered 2020-05-22 – 2020-05-23 (×2): 650 mg via ORAL
  Filled 2020-05-22 (×2): qty 2

## 2020-05-22 MED ORDER — ALBUTEROL SULFATE (2.5 MG/3ML) 0.083% IN NEBU: 2.5000 mg | INHALATION_SOLUTION | RESPIRATORY_TRACT | Status: DC | PRN

## 2020-05-22 MED ORDER — ONDANSETRON HCL 4 MG/2ML IJ SOLN
INTRAMUSCULAR | Status: AC
  Filled 2020-05-22: qty 2

## 2020-05-22 MED ORDER — B COMPLEX PO TABS: 1.0000 | ORAL_TABLET | Freq: Every day | ORAL | Status: DC

## 2020-05-22 MED ORDER — SUCCINYLCHOLINE CHLORIDE 20 MG/ML IJ SOLN
INTRAMUSCULAR | Status: DC | PRN
  Administered 2020-05-22: 120 mg via INTRAVENOUS

## 2020-05-22 MED ORDER — IPRATROPIUM-ALBUTEROL 0.5-2.5 (3) MG/3ML IN SOLN: 3.0000 mL | RESPIRATORY_TRACT | Status: DC | PRN

## 2020-05-22 MED ORDER — OXYCODONE HCL 5 MG PO TABS
10.0000 mg | ORAL_TABLET | ORAL | Status: DC | PRN
  Administered 2020-05-22 – 2020-05-23 (×6): 10 mg via ORAL
  Filled 2020-05-22 (×5): qty 2

## 2020-05-22 MED ORDER — KETAMINE HCL 10 MG/ML IJ SOLN
INTRAMUSCULAR | Status: DC | PRN
Start: 2020-05-22 — End: 2020-05-22
  Administered 2020-05-22: 50 mg via INTRAVENOUS

## 2020-05-22 MED ORDER — CHLORHEXIDINE GLUCONATE 0.12 % MT SOLN
15.0000 mL | Freq: Once | OROMUCOSAL | Status: AC
  Administered 2020-05-22: 15 mL via OROMUCOSAL
  Filled 2020-05-22: qty 15

## 2020-05-22 MED ORDER — DEXAMETHASONE SODIUM PHOSPHATE 10 MG/ML IJ SOLN
10.0000 mg | Freq: Once | INTRAMUSCULAR | Status: AC
  Administered 2020-05-22: 10 mg via INTRAVENOUS
  Filled 2020-05-22: qty 1

## 2020-05-22 MED ORDER — OXYCODONE-ACETAMINOPHEN 10-325 MG PO TABS: 1.0000 | ORAL_TABLET | Freq: Three times a day (TID) | ORAL | Status: DC | PRN

## 2020-05-22 MED ORDER — 0.9 % SODIUM CHLORIDE (POUR BTL) OPTIME
TOPICAL | Status: DC | PRN
  Administered 2020-05-22: 1000 mL

## 2020-05-22 MED ORDER — SUCCINYLCHOLINE CHLORIDE 200 MG/10ML IV SOSY
PREFILLED_SYRINGE | INTRAVENOUS | Status: AC
  Filled 2020-05-22: qty 10

## 2020-05-22 MED ORDER — PROPOFOL 1000 MG/100ML IV EMUL
INTRAVENOUS | Status: AC
  Filled 2020-05-22: qty 100

## 2020-05-22 MED ORDER — THROMBIN 5000 UNITS EX SOLR
CUTANEOUS | Status: AC
  Filled 2020-05-22: qty 10000

## 2020-05-22 SURGICAL SUPPLY — 62 items
BAG DECANTER FOR FLEXI CONT (MISCELLANEOUS) ×3 IMPLANT
BENZOIN TINCTURE PRP APPL 2/3 (GAUZE/BANDAGES/DRESSINGS) ×3 IMPLANT
BLADE CLIPPER SURG (BLADE) IMPLANT
BONE MATRIX OSTEOCEL PRO MED (Bone Implant) ×2 IMPLANT
BUR MATCHSTICK NEURO 3.0 LAGG (BURR) IMPLANT
CARTRIDGE OIL MAESTRO DRILL (MISCELLANEOUS) ×1 IMPLANT
CLOSURE WOUND 1/2 X4 (GAUZE/BANDAGES/DRESSINGS) ×1
COVER BACK TABLE 60X90IN (DRAPES) ×3 IMPLANT
COVER WAND RF STERILE (DRAPES) ×3 IMPLANT
DERMABOND ADVANCED (GAUZE/BANDAGES/DRESSINGS) ×6
DERMABOND ADVANCED .7 DNX12 (GAUZE/BANDAGES/DRESSINGS) ×2 IMPLANT
DIFFUSER DRILL AIR PNEUMATIC (MISCELLANEOUS) ×3 IMPLANT
DRAPE C-ARM 42X72 X-RAY (DRAPES) ×3 IMPLANT
DRAPE C-ARMOR (DRAPES) ×3 IMPLANT
DRAPE LAPAROTOMY 100X72X124 (DRAPES) ×3 IMPLANT
DRAPE SURG 17X23 STRL (DRAPES) ×6 IMPLANT
DRSG OPSITE POSTOP 4X6 (GAUZE/BANDAGES/DRESSINGS) ×4 IMPLANT
DURAPREP 26ML APPLICATOR (WOUND CARE) ×3 IMPLANT
ELECT REM PT RETURN 9FT ADLT (ELECTROSURGICAL) ×3
ELECTRODE REM PT RTRN 9FT ADLT (ELECTROSURGICAL) ×1 IMPLANT
GAUZE 4X4 16PLY RFD (DISPOSABLE) IMPLANT
GLOVE BIO SURGEON STRL SZ 6.5 (GLOVE) ×2 IMPLANT
GLOVE BIO SURGEON STRL SZ7.5 (GLOVE) ×2 IMPLANT
GLOVE BIO SURGEONS STRL SZ 6.5 (GLOVE) ×1
GLOVE BIOGEL PI IND STRL 6.5 (GLOVE) ×1 IMPLANT
GLOVE BIOGEL PI IND STRL 7.5 (GLOVE) IMPLANT
GLOVE BIOGEL PI INDICATOR 6.5 (GLOVE) ×2
GLOVE BIOGEL PI INDICATOR 7.5 (GLOVE) ×10
GLOVE ECLIPSE 9.0 STRL (GLOVE) ×3 IMPLANT
GLOVE EXAM NITRILE XL STR (GLOVE) IMPLANT
GLOVE SURG SS PI 7.5 STRL IVOR (GLOVE) ×8 IMPLANT
GOWN STRL REUS W/ TWL LRG LVL3 (GOWN DISPOSABLE) IMPLANT
GOWN STRL REUS W/ TWL XL LVL3 (GOWN DISPOSABLE) ×2 IMPLANT
GOWN STRL REUS W/TWL 2XL LVL3 (GOWN DISPOSABLE) IMPLANT
GOWN STRL REUS W/TWL LRG LVL3 (GOWN DISPOSABLE)
GOWN STRL REUS W/TWL XL LVL3 (GOWN DISPOSABLE) ×6
GUIDEWIRE NITINOL BEVEL TIP (WIRE) ×4 IMPLANT
KIT BASIN OR (CUSTOM PROCEDURE TRAY) ×3 IMPLANT
KIT DILATOR XLIF 5 (KITS) ×1 IMPLANT
KIT SURGICAL ACCESS MAXCESS 4 (KITS) ×2 IMPLANT
KIT TURNOVER KIT B (KITS) ×3 IMPLANT
KIT XLIF (KITS) ×1
MODULE NVM5 NEXT GEN EMG (NEEDLE) ×2 IMPLANT
MODULUS XLW 12X22X60MM 10 (Spine Construct) ×2 IMPLANT
NDL I-PASS III (NEEDLE) IMPLANT
NEEDLE HYPO 22GX1.5 SAFETY (NEEDLE) ×3 IMPLANT
NEEDLE I-PASS III (NEEDLE) ×3 IMPLANT
NS IRRIG 1000ML POUR BTL (IV SOLUTION) ×3 IMPLANT
OIL CARTRIDGE MAESTRO DRILL (MISCELLANEOUS) ×3
PACK LAMINECTOMY NEURO (CUSTOM PROCEDURE TRAY) ×3 IMPLANT
ROD RELINE MAS LORD 5.5X50 (Rod) ×2 IMPLANT
SCREW LOCK RELINE 5.5 TULIP (Screw) ×4 IMPLANT
SCREW MAS RELINE 6.5X45 POLY (Screw) ×4 IMPLANT
SPONGE LAP 4X18 RFD (DISPOSABLE) IMPLANT
STAPLER SKIN PROX WIDE 3.9 (STAPLE) ×2 IMPLANT
STRIP CLOSURE SKIN 1/2X4 (GAUZE/BANDAGES/DRESSINGS) ×2 IMPLANT
SUT VIC AB 2-0 CT1 18 (SUTURE) ×6 IMPLANT
SUT VIC AB 3-0 SH 8-18 (SUTURE) ×6 IMPLANT
TOWEL GREEN STERILE (TOWEL DISPOSABLE) ×3 IMPLANT
TOWEL GREEN STERILE FF (TOWEL DISPOSABLE) ×3 IMPLANT
TRAY FOLEY MTR SLVR 16FR STAT (SET/KITS/TRAYS/PACK) ×3 IMPLANT
WATER STERILE IRR 1000ML POUR (IV SOLUTION) ×3 IMPLANT

## 2020-05-22 NOTE — Anesthesia Postprocedure Evaluation (Signed)
Anesthesia Post Note  Patient: Brian Harrington  Procedure(s) Performed: Extreme Lateral Interbody Fusion - Lumbar one-Lumbar two - left with perc pedicle screws (Left )     Patient location during evaluation: PACU Anesthesia Type: General Level of consciousness: awake and alert Pain management: pain level controlled Vital Signs Assessment: post-procedure vital signs reviewed and stable Respiratory status: spontaneous breathing, nonlabored ventilation, respiratory function stable and patient connected to nasal cannula oxygen Cardiovascular status: blood pressure returned to baseline and stable Postop Assessment: no apparent nausea or vomiting Anesthetic complications: no    Last Vitals:  Vitals:   05/22/20 1113 05/22/20 1115  BP: (!) 168/85   Pulse: 69 68  Resp: 16 19  Temp:  36.4 C  SpO2: 96% 98%    Last Pain:  Vitals:   05/22/20 1115  TempSrc:   PainSc: 6                  Meade Hogeland,W. EDMOND

## 2020-05-22 NOTE — Brief Op Note (Signed)
05/22/2020  9:53 AM  PATIENT:  Brian Harrington  74 y.o. male  PRE-OPERATIVE DIAGNOSIS:  Stenosis  POST-OPERATIVE DIAGNOSIS:  Stenosis  PROCEDURE:  Procedure(s): Extreme Lateral Interbody Fusion - Lumbar one-Lumbar two - left with perc pedicle screws (Left)  SURGEON:  Surgeon(s) and Role:    * Julio Sicks, MD - Primary    * Jadene Pierini, MD - Assisting  PHYSICIAN ASSISTANT:   ASSISTANTS: bergman,np   ANESTHESIA:   general  EBL:  25 mL   BLOOD ADMINISTERED:none  DRAINS: none   LOCAL MEDICATIONS USED:  MARCAINE     SPECIMEN:  No Specimen  DISPOSITION OF SPECIMEN:  N/A  COUNTS:  YES  TOURNIQUET:  * No tourniquets in log *  DICTATION: .Dragon Dictation  PLAN OF CARE: Admit for overnight observation  PATIENT DISPOSITION:  PACU - hemodynamically stable.   Delay start of Pharmacological VTE agent (>24hrs) due to surgical blood loss or risk of bleeding: yes

## 2020-05-22 NOTE — Progress Notes (Signed)
Orthopedic Tech Progress Note Patient Details:  Brian Harrington 23-Apr-1946 [id number] Called in order to [facility] for an ASPEN LUMBAR BRACE Patient ID: Brian Harrington, male   DOB: 11-Jun-1946, 74 y.o.   MRN: [id number]   [name] 05/22/2020, 11:48 AM

## 2020-05-22 NOTE — Transfer of Care (Signed)
Immediate Anesthesia Transfer of Care Note  Patient: Brian Harrington  Procedure(s) Performed: Extreme Lateral Interbody Fusion - Lumbar one-Lumbar two - left with perc pedicle screws (Left )  Patient Location: PACU  Anesthesia Type:General  Level of Consciousness: awake, alert  and oriented  Airway & Oxygen Therapy: Patient Spontanous Breathing and Patient connected to nasal cannula oxygen  Post-op Assessment: Report given to RN, Post -op Vital signs reviewed and stable and Patient moving all extremities  Post vital signs: Reviewed and stable  Last Vitals:  Vitals Value Taken Time  BP 171/108 05/22/20 1012  Temp    Pulse 76 05/22/20 1016  Resp 17 05/22/20 1016  SpO2 97 % 05/22/20 1016  Vitals shown include unvalidated device data.  Last Pain:  Vitals:   05/22/20 0653  TempSrc:   PainSc: 6          Complications: No apparent anesthesia complications

## 2020-05-22 NOTE — Anesthesia Procedure Notes (Signed)
Procedure Name: Intubation Date/Time: 05/22/2020 8:02 AM Performed by: Kyung Rudd, CRNA Pre-anesthesia Checklist: Patient identified, Emergency Drugs available, Suction available and Patient being monitored Patient Re-evaluated:Patient Re-evaluated prior to induction Oxygen Delivery Method: Circle system utilized Preoxygenation: Pre-oxygenation with 100% oxygen Induction Type: IV induction and Rapid sequence Laryngoscope Size: Mac and 4 Grade View: Grade I Tube type: Oral Tube size: 7.5 mm Number of attempts: 1 Airway Equipment and Method: Stylet Placement Confirmation: ETT inserted through vocal cords under direct vision,  positive ETCO2 and breath sounds checked- equal and bilateral Secured at: 22 cm Tube secured with: Tape Dental Injury: Teeth and Oropharynx as per pre-operative assessment

## 2020-05-22 NOTE — H&P (Signed)
Brian Harrington is an 74 y.o. male.   Chief Complaint: Back pain HPI: 74 year old male status post numerous prior lumbar surgeries by another physician.  Patient with persistent back and bilateral lower extremity symptoms failing conservative management.  Work-up demonstrates evidence of significant residual stenosis with degenerative retrolisthesis at L1-2.  Patient presents now for left-sided L1 to anterior lateral retroperitoneal interbody fusion with posterior percutaneous pedicle screw fixation in hopes of improving his symptoms.  Past Medical History:  Diagnosis Date  . Arthritis   . Back pain, chronic   . CAD (coronary artery disease)   . COPD (chronic obstructive pulmonary disease) (HCC)    on 2L home O2  . Hypertension   . Myocardial infarction ([place])    2010  . Sarcoidosis of lung ([place])    reported by patient but no clear documentation of this condition  . Sleep apnea    no CPAP    Past Surgical History:  Procedure Laterality Date  . BACK SURGERY    . CARDIAC CATHETERIZATION     bare-metal stent to left anterior descending  . HAND SURGERY    . NECK SURGERY  06/23/2017  . Skin grafting left leg      Family History  Problem Relation Age of Onset  . Arthritis Mother   . Thrombocytopenia Mother   . Diabetes Brother   . Colon polyps Brother   . Depression Father   . CAD Neg Hx    Social History:  reports that he quit smoking about 28 years ago. His smoking use included cigarettes. He has a 25.00 pack-year smoking history. He has never used smokeless tobacco. He reports current alcohol use of about 1.0 standard drinks of alcohol per week. He reports current drug use. Drug: Marijuana.  Allergies:  Allergies  Allergen Reactions  . Amlodipine Besylate Shortness Of Breath and Swelling    tongue and facial swelling, difficulty breathing  . Morphine And Related Itching  . Sulfonamide Derivatives Nausea And Vomiting    Medications Prior to Admission  Medication Sig  Dispense Refill  . albuterol (PROVENTIL) (2.5 MG/3ML) 0.083% nebulizer solution Take 3 mLs (2.5 mg total) by nebulization every 4 (four) hours as needed for wheezing or shortness of breath. 750 mL 5  . Aspirin-Acetaminophen-Caffeine (GOODYS EXTRA STRENGTH) 500-325-65 MG PACK Take 1 Package by mouth daily as needed (pain).    Marland Kitchen b complex vitamins tablet Take 1 tablet by mouth daily.    . budesonide-formoterol (SYMBICORT) 80-4.5 MCG/ACT inhaler Take 2 puffs first thing in am and then another 2 puffs about 12 hours later. 1 Inhaler 11  . CALCIUM-VITAMIN D PO Take 1 tablet by mouth daily.    . carisoprodol (SOMA) 350 MG tablet Take 1 tablet (350 mg total) by mouth 3 (three) times daily as needed. for muscle spams 90 tablet 2  . cetirizine (ZYRTEC) 10 MG tablet Take 1 tablet (10 mg total) by mouth daily. 90 tablet 3  . COMBIVENT RESPIMAT 20-100 MCG/ACT AERS respimat USE 1 PUFF EVERY 4 HOURS AS NEEDED (Patient taking differently: Inhale 2 puffs into the lungs every 4 (four) hours as needed for wheezing or shortness of breath. ) 12 g 0  . famotidine (PEPCID) 20 MG tablet Take one tablet within one hour of bedtime 90 tablet 3  . fluticasone (FLONASE) 50 MCG/ACT nasal spray Place 1 spray into both nostrils 2 (two) times daily as needed for allergies or rhinitis. 16 g 6  . metoprolol tartrate (LOPRESSOR) 25 MG tablet Take  1 tablet (25 mg total) by mouth 2 (two) times daily. 180 tablet 3  . morphine (MSIR) 15 MG tablet Take 1 tablet (15 mg total) by mouth as needed for severe pain. Take 1 with Percocet as needed until after surgery. 30 tablet 0  . NARCAN 4 MG/0.1ML LIQD nasal spray kit USE 1 SPRAY INTO THE NOSE ONCE FOR 1 DOSE (Patient taking differently: Place 1 spray into the nose once. ) 2 each 2  . oxyCODONE-acetaminophen (PERCOCET) 10-325 MG tablet Take 1 tablet by mouth every 8 (eight) hours as needed for pain. 70 tablet 0  . OXYGEN Inhale 2 L into the lungs as needed (short of breath). 2 liters as needed      . pantoprazole (PROTONIX) 40 MG tablet TAKE ONE (1) TABLET EACH DAY (Patient taking differently: Take 40 mg by mouth daily. TAKE ONE (1) TABLET EACH DAY) 90 tablet 3  . zinc gluconate 50 MG tablet Take 50 mg by mouth daily.    . mupirocin cream (BACTROBAN) 2 % Apply 1 application topically 2 (two) times daily. 30 g 3  . sildenafil (REVATIO) 20 MG tablet Take 1-3 tablets (20-60 mg total) by mouth daily as needed. 40 tablet 2    Results for orders placed or performed during the hospital encounter of 05/22/20 (from the past 48 hour(s))  Type and screen     Status: None (Preliminary result)   Collection Time: 05/22/20  6:01 AM  Result Value Ref Range   ABO/RH(D) PENDING    Antibody Screen PENDING    Sample Expiration      05/25/2020,2359 Performed at [facility], [place] [address] [place], [place] [address]   CBC WITH DIFFERENTIAL     Status: None   Collection Time: 05/22/20  6:53 AM  Result Value Ref Range   WBC 6.2 4.0 - 10.5 K/uL   RBC 4.98 4.22 - 5.81 MIL/uL   Hemoglobin 13.1 13.0 - 17.0 g/dL   HCT 41.6 39.0 - 52.0 %   MCV 83.5 80.0 - 100.0 fL   MCH 26.3 26.0 - 34.0 pg   MCHC 31.5 30.0 - 36.0 g/dL   RDW 14.7 11.5 - 15.5 %   Platelets 197 150 - 400 K/uL   nRBC 0.0 0.0 - 0.2 %   Neutrophils Relative % 60 %   Neutro Abs 3.7 1.7 - 7.7 K/uL   Lymphocytes Relative 27 %   Lymphs Abs 1.6 0.7 - 4.0 K/uL   Monocytes Relative 10 %   Monocytes Absolute 0.6 0.1 - 1.0 K/uL   Eosinophils Relative 2 %   Eosinophils Absolute 0.1 0.0 - 0.5 K/uL   Basophils Relative 1 %   Basophils Absolute 0.1 0.0 - 0.1 K/uL   Immature Granulocytes 0 %   Abs Immature Granulocytes 0.02 0.00 - 0.07 K/uL    Comment: Performed at [facility], [address] [place], [place] [address]  Basic metabolic panel     Status: Abnormal   Collection Time: 05/22/20  6:53 AM  Result Value Ref Range   Sodium 138 135 - 145 mmol/L   Potassium 3.6 3.5 - 5.1 mmol/L   Chloride 100 98 - 111 mmol/L   CO2 27 22  - 32 mmol/L   Glucose, Bld 110 (H) 70 - 99 mg/dL    Comment: Glucose reference range applies only to samples taken after fasting for at least 8 hours.   BUN 20 8 - 23 mg/dL   Creatinine, Ser 0.98 0.61 -  1.24 mg/dL   Calcium 8.6 (L) 8.9 - 10.3 mg/dL   GFR calc non Af Amer >60 >60 mL/min   GFR calc Af Amer >60 >60 mL/min   Anion gap 11 5 - 15    Comment: Performed at [facility] [address] [place], [place] [address]   No results found.  Pertinent items noted in HPI and remainder of comprehensive ROS otherwise negative.  Blood pressure (!) 179/95, pulse 62, temperature 97.8 F (36.6 C), temperature source Oral, resp. rate 17, height 6' (1.829 m), weight 127.9 kg, SpO2 96 %. On examination the patient is awake and alert.  He is oriented and appropriate.  Speech is fluent.  Judgment insight are intact.  Cranial nerve function normal bilateral.  Motor and sensory function of the extremities is normal.  Deep tendon reflexes are hypoactive but symmetric.  No evidence of long track signs.  Gait is antalgic.  Posture is flexed peer examination head ears eyes nose throat [place] chest and abdomen are benign.  Extremities are free from injury deformity. 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  Plan left-sided L1 to anterior lateral lumbar interbody decompression and fusion utilizing interbody cage, morselized allograft and augmented with posterior percutaneous pedicle screw fixation.  Risks and benefits been explained.  Patient wishes to proceed.  Mallie Mussel A Daly Whipkey 05/22/2020, 7:32 AM

## 2020-05-22 NOTE — Op Note (Signed)
Date of procedure: 05/22/2020  Date of dictation: Same  Service: Neurosurgery  Preoperative diagnosis: L1-L2 degenerative retrolisthesis with stenosis  Postoperative diagnosis: Same  Procedure Name: Left L1-L2 retroperitoneal anterior lateral interbody decompression and fusion utilizing interbody cage, morselized allograft  Posterior percutaneous pedicle screw  fixation at L1-L2  Surgeon:Frenchie Dangerfield A.Abbygayle Helfand, M.D.  Asst. Surgeon: Harrold Donath, NP  Anesthesia: General  Indication: 74 year old male status post numerous spinal surgeries from an outside physician presents with severe back and bilateral lower extremity pain.  Patient with postop change at L1-2 with residual stenosis and degenerative retrolisthesis.  No other areas of high-grade stenosis.  Patient presents now for anterior lateral decompression and fusion in hopes of improving his symptoms.  Operative note: After induction of anesthesia, patient position in the right lateral decubitus position appropriately padded.  Patient's left flank and lumbar region prepped and draped sterilely.  2 separate incisions were made the first in the left lateral space overlying L1-L2 and a secondary posterior flank incision.  Using the secondary posterior flank incision blunt dissection was then used to gain access to the retroperitoneal space.  Returning to the lateral incision a dilator was then passed between the ribs of T11 and T12.  This was then passed through the lateral psoas muscle and docked into the disc space of L1 and L2.  Dilators were stimulated and found to be free of any adjacent neural structures.  Dilators were sequentially enlarged again stimulating between each step.  A self-retaining retractor was placed.  The retractor was opened.  The disc base was examined and stimulated.  A shim was then passed into the disc space under fluoroscopic guidance.  The distractor was opened further.  Discectomy was then performed using a 15  blade and various pituitary rongeurs and curettes to remove the disc.  Contralateral release was performed.  The disc space was sequentially dilated and a 12 mm lordotic implant was found to be most appropriate.  A 12 mm x 22 mm x 54mm titanium printed cage was packed with osteocell plus.  This is impacted under fluoroscopic guidance and found to be in good position both AP and lateral planes.  The bed was returned to a neutral position.  The retractor was removed.  L1 and L2 pedicle entry sites were ascertained.  Stab incisions were made over the entry sites.  Jamshidi needle introducers were then passed into the pedicles of L1 and L2.  K wires were passed into the vertebral bodies of L1 and L2.  6.5 mm NuVasive pedicle screws were then placed into the bodies of L1 and L2.  Short segment titanium rod were then passed through the screw towers.  This was then secured using locking caps.  Locking caps were given a final tightening.  Towers were removed.  Final images reveal good position of the cage and the hardware at the proper upper level with normal alignment of spine.  Wounds were then irrigated and then closed in typical fashion.  Steri-Strips and sterile dressings were applied.  No apparent complications.  Patient tolerated the procedure well and he returns to the recovery room postop.

## 2020-05-23 ENCOUNTER — Other Ambulatory Visit: Payer: Self-pay | Admitting: *Deleted

## 2020-05-23 DIAGNOSIS — J449 Chronic obstructive pulmonary disease, unspecified: Secondary | ICD-10-CM | POA: Diagnosis not present

## 2020-05-23 DIAGNOSIS — G4730 Sleep apnea, unspecified: Secondary | ICD-10-CM | POA: Diagnosis not present

## 2020-05-23 DIAGNOSIS — K219 Gastro-esophageal reflux disease without esophagitis: Secondary | ICD-10-CM | POA: Diagnosis not present

## 2020-05-23 DIAGNOSIS — M48062 Spinal stenosis, lumbar region with neurogenic claudication: Secondary | ICD-10-CM | POA: Diagnosis not present

## 2020-05-23 DIAGNOSIS — I252 Old myocardial infarction: Secondary | ICD-10-CM | POA: Diagnosis not present

## 2020-05-23 DIAGNOSIS — M4316 Spondylolisthesis, lumbar region: Secondary | ICD-10-CM | POA: Diagnosis not present

## 2020-05-23 DIAGNOSIS — I2510 Atherosclerotic heart disease of native coronary artery without angina pectoris: Secondary | ICD-10-CM | POA: Diagnosis not present

## 2020-05-23 DIAGNOSIS — I10 Essential (primary) hypertension: Secondary | ICD-10-CM | POA: Diagnosis not present

## 2020-05-23 MED ORDER — OXYCODONE HCL 10 MG PO TABS: 10.0000 mg | ORAL_TABLET | ORAL | 0 refills | Status: AC | PRN

## 2020-05-23 MED ORDER — FAMOTIDINE 20 MG PO TABS: ORAL_TABLET | ORAL | 3 refills | Status: AC

## 2020-05-23 MED ORDER — CYCLOBENZAPRINE HCL 10 MG PO TABS: 10.0000 mg | ORAL_TABLET | Freq: Three times a day (TID) | ORAL | 0 refills | Status: AC | PRN

## 2020-05-23 NOTE — Discharge Instructions (Addendum)
Wound Care Keep incision covered and dry for two days.  Do not put any creams, lotions, or ointments on incision. Leave steri-strips on back.  They will fall off by themselves. Activity Walk each and every day, increasing distance each day. No lifting greater than 5 lbs.  Avoid excessive back motion. No driving for 2 weeks; may ride as a passenger locally. If provided with back brace, wear when out of bed.  It is not necessary to wear brace in bed. Diet Resume your normal diet.  Return to Work Will be discussed at you follow up appointment. Call Your Doctor If Any of These Occur Redness, drainage, or swelling at the wound.  Temperature greater than 101 degrees. Severe pain not relieved by pain medication. Incision starts to come apart. Follow Up Appt Call today for appointment in 1-2 weeks ([phone]) or for problems.  If you have any hardware placed in your spine, you will need an x-ray before your appointment.  

## 2020-05-23 NOTE — Telephone Encounter (Signed)
Fax from [facility] RF request for Loratadine 10 mg 1QD Given today IP, not on current med list  Request for Famotidine 20 mg Dr. Sherene Sires did 05/20/20 #90 w/ 3 RFs was set to No Print Please advise

## 2020-05-23 NOTE — Evaluation (Signed)
Physical Therapy Evaluation and DISCHARGE Patient Details Name: Brian Harrington MRN: [id number] DOB: 1946/01/01 Today's Date: 05/23/2020   History of Present Illness  74 year old male status post numerous prior lumbar surgeries by another physician. Pt underwent an anterolateral interbody fusion at L1-2.  PMH: arthritis, COPD, CAD PSH: multiple back surgery, neck surgery.    Clinical Impression  Pt functioning at supervision/mod I level of function. Pt able to state back precautions. Pt reports he has a friend who can assist with grocery shopping and hired help for cleaning and laundry. Pt able to amb without AD safely and can negotiate 3 stairs with handrail to enter home. Pt with no further acute PT needs at this time. PT SIGNING OFF. Please re-consult if needed in future.    Follow Up Recommendations No PT follow up    Equipment Recommendations  None recommended by PT    Recommendations for Other Services       Precautions / Restrictions Precautions Precautions: Back Precaution Booklet Issued: Yes (comment) Precaution Comments: pt with verbal understanding but requires v/c's to adhere during function Required Braces or Orthoses: Spinal Brace Spinal Brace: Lumbar corset;Applied in sitting position(pt required max directional verbal cues ) Restrictions Weight Bearing Restrictions: No      Mobility  Bed Mobility               General bed mobility comments: pt seated EOB upon arrival, left seated EOB end of session. Verbally reviewed log roll technique   Transfers Overall transfer level: Needs assistance Equipment used: None Transfers: Sit to/from Stand Sit to Stand: Min guard         General transfer comment: verbal cues to minimize trunk flexion, min guard for balance and safety, able to power up without assist  Ambulation/Gait Ambulation/Gait assistance: Min guard Gait Distance (Feet): 400 Feet Assistive device: None Gait Pattern/deviations: Step-through  pattern;Decreased stride length Gait velocity: decreased compared to normal Gait velocity interpretation: 1.31 - 2.62 ft/sec, indicative of limited community ambulator General Gait Details: no episodes of LOB, pt able to maintain upright posture t/o ambulation  Stairs Stairs: Yes Stairs assistance: Min guard Stair Management: One rail Left;Step to pattern;Forwards Number of Stairs: 3 General stair comments: pt able to complete safely, instructed pt to isometrically contract abdominal muscles when doing stairs for increased support for back  Wheelchair Mobility    Modified Rankin (Stroke Patients Only)       Balance Overall balance assessment: No apparent balance deficits (not formally assessed)                                           Pertinent Vitals/Pain Pain Assessment: 0-10 Pain Score: 5  Pain Location: back/incision Pain Descriptors / Indicators: Operative site guarding Pain Intervention(s): Monitored during session    Home Living Family/patient expects to be discharged to:: Private residence Living Arrangements: Alone(has a dog and a "friend") Available Help at Discharge: Friend(s);Available PRN/intermittently Type of Home: House Home Access: Stairs to enter Entrance Stairs-Rails: Left Entrance Stairs-Number of Steps: 3 Home Layout: One level Home Equipment: Walker - 2 wheels;Shower seat;Hand held shower head;Bedside commode      Prior Function Level of Independence: Independent         Comments: was driving     Hand Dominance   Dominant Hand: Right    Extremity/Trunk Assessment   Upper Extremity Assessment Upper Extremity Assessment: Overall [employer]  for tasks assessed(pt with onset of L flank pain with R UE MMT)    Lower Extremity Assessment Lower Extremity Assessment: Overall WFL for tasks assessed    Cervical / Trunk Assessment Cervical / Trunk Assessment: Other exceptions Cervical / Trunk Exceptions: s/p spinal sx    Communication   Communication: No difficulties  Cognition Arousal/Alertness: Awake/alert Behavior During Therapy: WFL for tasks assessed/performed Overall Cognitive Status: Within Functional Limits for tasks assessed                                 General Comments: for basic tasks assessed today       General Comments General comments (skin integrity, edema, etc.): incision not observed, worked on donning and doffing of brace    Exercises     Assessment/Plan    PT Assessment Patent does not need any further PT services  PT Problem List         PT Treatment Interventions      PT Goals (Current goals can be found in the Care Plan section)  Acute Rehab PT Goals Patient Stated Goal: hopeful for home today  PT Goal Formulation: All assessment and education complete, DC therapy    Frequency     Barriers to discharge        Co-evaluation               AM-PAC PT "6 Clicks" Mobility  Outcome Measure Help needed turning from your back to your side while in a flat bed without using bedrails?: None Help needed moving from lying on your back to sitting on the side of a flat bed without using bedrails?: None Help needed moving to and from a bed to a chair (including a wheelchair)?: None Help needed standing up from a chair using your arms (e.g., wheelchair or bedside chair)?: None Help needed to walk in hospital room?: None Help needed climbing 3-5 steps with a railing? : A Little 6 Click Score: 23    End of Session Equipment Utilized During Treatment: Gait belt;Back brace Activity Tolerance: Patient tolerated treatment well Patient left: in bed;with call bell/phone within reach(at EOB with OT) Nurse Communication: Mobility status PT Visit Diagnosis: Difficulty in walking, not elsewhere classified (R26.2)    Time: 6226-3335 PT Time Calculation (min) (ACUTE ONLY): 18 min   Charges:   PT Evaluation $PT Eval Moderate Complexity: 1 Mod           Brian Harrington, PT, DPT Acute Rehabilitation Services Pager #: [phone] Office #: [phone]   Brian Harrington 05/23/2020, 11:09 AM

## 2020-05-23 NOTE — Evaluation (Signed)
Occupational Therapy Evaluation Patient Details Name: Brian Harrington MRN: [id number] DOB: 05/15/1946 Today's Date: 05/23/2020    History of Present Illness 74 year old male status post numerous prior lumbar surgeries by another physician. Pt underwent an anterolateral interbody fusion at L1-2.  PMH: arthritis, COPD, CAD PSH: multiple back surgery, neck surgery.   Clinical Impression   This 74 y/o male presents with the above. PTA pt reports independence-mod independence with ADL and functional mobility, living alone. Pt overall completing functional transfers without AD at supervision-minguard assist level. Pt performing LB ADL with minguard assist and UB ADL with setup assist. Further reviewed and educated pt re: back precautions, brace management, AE, safety and compensatory techniques for completing ADL and functional transfers with pt verbalizing understanding. Pt requiring min cues for adherence to precautions throughout. Pt with some back pain s/p sx but overall tolerating session. Pt anticipating d/c home today - do not anticipate he will require follow up OT services after discharge.     Follow Up Recommendations  No OT follow up;Supervision - Intermittent    Equipment Recommendations  None recommended by OT(pt's DME needs are met )           Precautions / Restrictions Precautions Precautions: Back Precaution Booklet Issued: Yes (comment) Precaution Comments: pt with verbal understanding but requires v/c's to adhere during function Restrictions Weight Bearing Restrictions: No      Mobility Bed Mobility               General bed mobility comments: pt seated EOB upon arrival, left seated EOB end of session. Verbally reviewed log roll technique   Transfers Overall transfer level: Needs assistance Equipment used: None Transfers: Sit to/from Stand Sit to Stand: Min guard;Supervision         General transfer comment: for balance and safety, pt with good power up  through LEs, increased effort but no physical assist required     Balance Overall balance assessment: No apparent balance deficits (not formally assessed)                                         ADL either performed or assessed with clinical judgement   ADL Overall ADL's : Needs assistance/impaired Eating/Feeding: Modified independent;Sitting   Grooming: Supervision/safety;Standing   Upper Body Bathing: Supervision/ safety;Sitting   Lower Body Bathing: Supervison/ safety;Sit to/from stand;With adaptive equipment Lower Body Bathing Details (indicate cue type and reason): pt reports he has a long handled sponge he can use; educated to use shower seat for ease of reaching LEs and for increased safety Upper Body Dressing : Set up;Sitting Upper Body Dressing Details (indicate cue type and reason): light assist to position lumbar brace at center of back, pt otherwise able to don/manage on his own.  Lower Body Dressing: Min guard;Sit to/from stand Lower Body Dressing Details (indicate cue type and reason): increased effort and min cues not to bend forward - pt donning pants/underwear  Toilet Transfer: Min guard;Ambulation   Toileting- Clothing Manipulation and Hygiene: Min guard;Sit to/from stand;Sitting/lateral lean;With adaptive equipment Toileting - Clothing Manipulation Details (indicate cue type and reason): discussed use of AE for increasing ease of performing pericare while maintaining precautions. pt verbalizing understanding      Functional mobility during ADLs: Min guard General ADL Comments: pt requiring min cues for carryover of precautions      Vision  Perception     Praxis      Pertinent Vitals/Pain Pain Assessment: 0-10 Pain Score: 5  Pain Location: back/incision Pain Descriptors / Indicators: Operative site guarding Pain Intervention(s): Monitored during session     Hand Dominance Right   Extremity/Trunk Assessment Upper Extremity  Assessment Upper Extremity Assessment: Overall WFL for tasks assessed   Lower Extremity Assessment Lower Extremity Assessment: Defer to PT evaluation   Cervical / Trunk Assessment Cervical / Trunk Assessment: Other exceptions Cervical / Trunk Exceptions: s/p spinal sx    Communication Communication Communication: No difficulties   Cognition Arousal/Alertness: Awake/alert Behavior During Therapy: WFL for tasks assessed/performed Overall Cognitive Status: Within Functional Limits for tasks assessed                                 General Comments: for basic tasks assessed today    General Comments  dressing over incision, appears clean and intact     Exercises     Shoulder Instructions      Home Living Family/patient expects to be discharged to:: Private residence Living Arrangements: Alone(has a dog and a "friend") Available Help at Discharge: Friend(s);Available PRN/intermittently Type of Home: House Home Access: Stairs to enter [employer] of Steps: 3 Entrance Stairs-Rails: Left Home Layout: One level     Bathroom Shower/Tub: Tub/shower unit;Walk-in shower(reports walk-in shower "downstairs" )   Bathroom Toilet: Standard(has BSC to put over top)     Home Equipment: Walker - 2 wheels;Shower seat;Hand held shower head;Bedside commode          Prior Functioning/Environment Level of Independence: Independent        Comments: was driving        OT Problem List: Decreased strength;Decreased activity tolerance;Impaired balance (sitting and/or standing);Decreased knowledge of precautions;Decreased knowledge of use of DME or AE;Pain      OT Treatment/Interventions:      OT Goals(Current goals can be found in the care plan section) Acute Rehab OT Goals Patient Stated Goal: hopeful for home today  OT Goal Formulation: All assessment and education complete, DC therapy  OT Frequency:     Barriers to D/C:            Co-evaluation               AM-PAC OT "6 Clicks" Daily Activity     Outcome Measure Help from another person eating meals?: None Help from another person taking care of personal grooming?: A Little Help from another person toileting, which includes using toliet, bedpan, or urinal?: A Little Help from another person bathing (including washing, rinsing, drying)?: A Little Help from another person to put on and taking off regular upper body clothing?: A Little Help from another person to put on and taking off regular lower body clothing?: A Little 6 Click Score: 19   End of Session Equipment Utilized During Treatment: Back brace Nurse Communication: Mobility status  Activity Tolerance: Patient tolerated treatment well Patient left: with call bell/phone within reach;Other (comment)(seated EOB)  OT Visit Diagnosis: Other abnormalities of gait and mobility (R26.89);Pain Pain - part of body: (back)                Time: 8921-1941 OT Time Calculation (min): 26 min Charges:  OT General Charges $OT Visit: 1 Visit OT Evaluation $OT Eval Low Complexity: 1 Low OT Treatments $Self Care/Home Management : 8-22 mins  Lou Cal, OT Acute Rehabilitation Services Pager [phone] Office  [phone]   Raymondo Band 05/23/2020, 9:06 AM

## 2020-05-23 NOTE — Discharge Summary (Signed)
Physician Discharge Summary  Patient ID: Brian Harrington MRN: [id number] DOB/AGE: 04-May-1946 74 y.o.  Admit date: 05/22/2020 Discharge date: 05/23/2020  Admission Diagnoses:  Discharge Diagnoses:  Active Problems:   Degenerative spondylolisthesis   Discharged Condition: good  Hospital Course: Patient admitted to the hospital where he underwent uncomplicated B6-L8 decompression and fusion.  Postoperatively doing well.  Preoperative back and lower extremity symptoms as well as headache much improved.  Standing walking and voiding without difficulty.  Ready for discharge home.  Consults:   Significant Diagnostic Studies:   Treatments:   Discharge Exam: Blood pressure 138/80, pulse (!) 56, temperature 98.1 F (36.7 C), temperature source Oral, resp. rate 16, height 6' (1.829 m), weight 127.9 kg, SpO2 98 %. Awake and alert.  Oriented and appropriate.  Cranial nerve function intact.  Motor and sensory function of the extremities normal.  Wound clean and dry.  Disposition: Discharge disposition: 01-Home or Self Care        Allergies as of 05/23/2020      Reactions   Amlodipine Besylate Shortness Of Breath, Swelling   tongue and facial swelling, difficulty breathing   Morphine And Related Itching   Sulfonamide Derivatives Nausea And Vomiting      Medication List    STOP taking these medications   morphine 15 MG tablet Commonly known as: MSIR   oxyCODONE-acetaminophen 10-325 MG tablet Commonly known as: PERCOCET     TAKE these medications   albuterol (2.5 MG/3ML) 0.083% nebulizer solution Commonly known as: PROVENTIL Take 3 mLs (2.5 mg total) by nebulization every 4 (four) hours as needed for wheezing or shortness of breath.   b complex vitamins tablet Take 1 tablet by mouth daily.   budesonide-formoterol 80-4.5 MCG/ACT inhaler Commonly known as: Symbicort Take 2 puffs first thing in am and then another 2 puffs about 12 hours later.   CALCIUM-VITAMIN D PO Take  1 tablet by mouth daily.   carisoprodol 350 MG tablet Commonly known as: SOMA Take 1 tablet (350 mg total) by mouth 3 (three) times daily as needed. for muscle spams   cetirizine 10 MG tablet Commonly known as: ZYRTEC Take 1 tablet (10 mg total) by mouth daily.   Combivent Respimat 20-100 MCG/ACT Aers respimat Generic drug: Ipratropium-Albuterol USE 1 PUFF EVERY 4 HOURS AS NEEDED What changed: See the new instructions.   cyclobenzaprine 10 MG tablet Commonly known as: FLEXERIL Take 1 tablet (10 mg total) by mouth 3 (three) times daily as needed for muscle spasms.   famotidine 20 MG tablet Commonly known as: Pepcid Take one tablet within one hour of bedtime   fluticasone 50 MCG/ACT nasal spray Commonly known as: FLONASE Place 1 spray into both nostrils 2 (two) times daily as needed for allergies or rhinitis.   Goodys Extra Strength [id number] MG Pack Generic drug: Aspirin-Acetaminophen-Caffeine Take 1 Package by mouth daily as needed (pain).   metoprolol tartrate 25 MG tablet Commonly known as: LOPRESSOR Take 1 tablet (25 mg total) by mouth 2 (two) times daily.   mupirocin cream 2 % Commonly known as: Bactroban Apply 1 application topically 2 (two) times daily.   Narcan 4 MG/0.1ML Liqd nasal spray kit Generic drug: naloxone USE 1 SPRAY INTO THE NOSE ONCE FOR 1 DOSE What changed: See the new instructions.   Oxycodone HCl 10 MG Tabs Take 1 tablet (10 mg total) by mouth every 3 (three) hours as needed for severe pain ((score 7 to 10)).   OXYGEN Inhale 2 L into the lungs as needed (  short of breath). 2 liters as needed   pantoprazole 40 MG tablet Commonly known as: PROTONIX TAKE ONE (1) TABLET EACH DAY What changed:   how much to take  how to take this  when to take this   sildenafil 20 MG tablet Commonly known as: REVATIO Take 1-3 tablets (20-60 mg total) by mouth daily as needed.   zinc gluconate 50 MG tablet Take 50 mg by mouth daily.             Durable Medical Equipment  (From admission, onward)         Start     Ordered   05/22/20 1140  DME Walker rolling  Once    Question:  Patient needs a walker to treat with the following condition  Answer:  Degenerative spondylolisthesis   05/22/20 1139   05/22/20 1140  DME 3 n 1  Once     05/22/20 1139           Signed: Cooper Render Pool 05/23/2020, 8:42 AM

## 2020-05-23 NOTE — Plan of Care (Signed)
Patient alert and oriented, mae's well, voiding adequate amount of urine, swallowing without difficulty, no c/o pain at time of discharge. Patient discharged home with family. Script and discharged instructions given to patient. Patient and family stated understanding of instructions given. Patient has an appointment with Dr. Pool  

## 2020-05-31 ENCOUNTER — Other Ambulatory Visit: Payer: Self-pay | Admitting: Internal Medicine

## 2020-05-31 DIAGNOSIS — J449 Chronic obstructive pulmonary disease, unspecified: Secondary | ICD-10-CM

## 2020-05-31 MED ORDER — BUDESONIDE-FORMOTEROL FUMARATE 80-4.5 MCG/ACT IN AERO: INHALATION_SPRAY | RESPIRATORY_TRACT | 11 refills | Status: DC

## 2020-06-01 ENCOUNTER — Other Ambulatory Visit: Payer: Self-pay | Admitting: Family Medicine

## 2020-06-01 DIAGNOSIS — K219 Gastro-esophageal reflux disease without esophagitis: Secondary | ICD-10-CM

## 2020-06-01 DIAGNOSIS — M5136 Other intervertebral disc degeneration, lumbar region: Secondary | ICD-10-CM

## 2020-06-01 DIAGNOSIS — F1120 Opioid dependence, uncomplicated: Secondary | ICD-10-CM

## 2020-06-01 NOTE — Telephone Encounter (Signed)
  Prescription Request  06/01/2020  What is the name of the medication or equipment? Morphine, oxycodone 10/325, carisoprodol, zyrtec 10 MG,   Have you contacted your pharmacy to request a refill? (if applicable) yes  Which pharmacy would you like this sent to? [facility] in [place]   Patient notified that their request is being sent to the clinical staff for review and that they should receive a response within 2 business days.

## 2020-06-04 ENCOUNTER — Other Ambulatory Visit: Payer: Self-pay | Admitting: Internal Medicine

## 2020-06-04 ENCOUNTER — Telehealth: Payer: Self-pay | Admitting: Family Medicine

## 2020-06-04 MED ORDER — CARISOPRODOL 350 MG PO TABS: 350.0000 mg | ORAL_TABLET | Freq: Three times a day (TID) | ORAL | 2 refills | Status: AC | PRN

## 2020-06-04 NOTE — Telephone Encounter (Signed)
Phone call taken care of in different encounter.  This encounter will now be closed  

## 2020-06-04 NOTE — Telephone Encounter (Signed)
Yes and then that last visit I knew that he was having the surgery and so I said come back in 1 month which would be about right now, I only did 1 month because of the surgery and I needed to assess where he was after that month, I told him this in the appointment and told him that I would need to see him in a month this time because of the surgery

## 2020-06-04 NOTE — Telephone Encounter (Signed)
  Prescription Request  06/04/2020  What is the name of the medication or equipment? Oxycodone  Have you contacted your pharmacy to request a refill? (if applicable) Yes  Which pharmacy would you like this sent to? [facility], [place]  Pt called to see when he can get a refill on his Oxycodone Rx because he is almost out. Pt says he just had surgery and has been taking 1 pill every 3-4 hrs as directed for pain. Wants to know when he can get a refill.   Patient notified that their request is being sent to the clinical staff for review and that they should receive a response within 2 business days.

## 2020-06-04 NOTE — Telephone Encounter (Signed)
Spoke with [facility] at [facility] in [place].  Pharmacist states that patient does not have another oxycodone Rx on file.  The last filled there was on 05/03/20.  Patient will need a new Rx sent to pharmacy

## 2020-06-04 NOTE — Addendum Note (Signed)
Addended by: Arville Care on: 06/04/2020 08:18 AM   Modules accepted: Orders

## 2020-06-04 NOTE — Telephone Encounter (Signed)
Patient aware.

## 2020-06-04 NOTE — Telephone Encounter (Signed)
I have sent Soma, the morphine was a one-time order and oxycodone he should have prescriptions for but it is just too early for him to pick them up.  I guess I forgot to refill the Zyrtec so go ahead and refill that one for him as well.

## 2020-06-07 ENCOUNTER — Ambulatory Visit: Payer: Medicare HMO | Admitting: Family Medicine

## 2020-06-15 ENCOUNTER — Ambulatory Visit (INDEPENDENT_AMBULATORY_CARE_PROVIDER_SITE_OTHER): Payer: Medicare HMO | Admitting: Family Medicine

## 2020-06-15 ENCOUNTER — Encounter: Payer: Self-pay | Admitting: Family Medicine

## 2020-06-15 ENCOUNTER — Other Ambulatory Visit: Payer: Self-pay

## 2020-06-15 VITALS — BP 122/72 | HR 71 | Temp 98.1°F | Ht 72.0 in | Wt 279.0 lb

## 2020-06-15 DIAGNOSIS — M4310 Spondylolisthesis, site unspecified: Secondary | ICD-10-CM

## 2020-06-15 DIAGNOSIS — Z9114 Patient's other noncompliance with medication regimen: Secondary | ICD-10-CM | POA: Diagnosis not present

## 2020-06-15 NOTE — Progress Notes (Signed)
BP 122/72   Pulse 71   Temp 98.1 F (36.7 C)   Ht 6' (1.829 m)   Wt 279 lb (126.6 kg)   SpO2 97%   BMI 37.84 kg/m    Subjective:   Patient ID: Brian Harrington, male    DOB: [birth year], 74 y.o.   MRN: [id number]  HPI: Brian Harrington is a 74 y.o. male presenting on 06/15/2020 for Medical Management of Chronic Issues and degenerative disc disease (Request Percocet today)   HPI Patient is coming here for pain management recheck.  He had a failed drug screen last month but says it was due to an injection from another doctor which could have been valid but now over this past month from his surgeon he took multiple prescriptions of oxycodone on top of what we could prescribe.  He had signed a contract previously and knows that he was not supposed to do that.  We will refer to pain management and instructed him that he follows contract very vigorously with them.  He did just have a surgery and back surgery and he has chronic pain in his back and degenerative disc disease  Relevant past medical, surgical, family and social history reviewed and updated as indicated. Interim medical history since our last visit reviewed. Allergies and medications reviewed and updated.  Review of Systems  Constitutional: Negative for chills and fever.  Respiratory: Negative for shortness of breath and wheezing.   Cardiovascular: Negative for chest pain and leg swelling.  Musculoskeletal: Positive for back pain. Negative for gait problem.  Skin: Negative for rash.  All other systems reviewed and are negative.   Per HPI unless specifically indicated above   Allergies as of 06/15/2020      Reactions   Amlodipine Besylate Shortness Of Breath, Swelling   tongue and facial swelling, difficulty breathing   Morphine And Related Itching   Sulfonamide Derivatives Nausea And Vomiting      Medication List       Accurate as of June 15, 2020  3:16 PM. If you have any questions, ask your nurse or doctor.         albuterol (2.5 MG/3ML) 0.083% nebulizer solution Commonly known as: PROVENTIL Take 3 mLs (2.5 mg total) by nebulization every 4 (four) hours as needed for wheezing or shortness of breath.   b complex vitamins tablet Take 1 tablet by mouth daily.   budesonide-formoterol 80-4.5 MCG/ACT inhaler Commonly known as: Symbicort Take 2 puffs first thing in am and then another 2 puffs about 12 hours later.   CALCIUM-VITAMIN D PO Take 1 tablet by mouth daily.   carisoprodol 350 MG tablet Commonly known as: SOMA Take 1 tablet (350 mg total) by mouth 3 (three) times daily as needed. for muscle spams   cetirizine 10 MG tablet Commonly known as: ZYRTEC Take 1 tablet (10 mg total) by mouth daily.   Combivent Respimat 20-100 MCG/ACT Aers respimat Generic drug: Ipratropium-Albuterol Inhale 2 puffs into the lungs every 4 (four) hours as needed for wheezing or shortness of breath.   cyclobenzaprine 10 MG tablet Commonly known as: FLEXERIL Take 1 tablet (10 mg total) by mouth 3 (three) times daily as needed for muscle spasms.   famotidine 20 MG tablet Commonly known as: Pepcid Take one tablet within one hour of bedtime   fluticasone 50 MCG/ACT nasal spray Commonly known as: FLONASE Place 1 spray into both nostrils 2 (two) times daily as needed for allergies or rhinitis.   Goodys Extra  Strength 500-325-65 MG Pack Generic drug: Aspirin-Acetaminophen-Caffeine Take 1 Package by mouth daily as needed (pain).   metoprolol tartrate 25 MG tablet Commonly known as: LOPRESSOR Take 1 tablet (25 mg total) by mouth 2 (two) times daily.   mupirocin cream 2 % Commonly known as: Bactroban Apply 1 application topically 2 (two) times daily.   Narcan 4 MG/0.1ML Liqd nasal spray kit Generic drug: naloxone USE 1 SPRAY INTO THE NOSE ONCE FOR 1 DOSE What changed: See the new instructions.   Oxycodone HCl 10 MG Tabs Take 1 tablet (10 mg total) by mouth every 3 (three) hours as needed for severe pain  ((score 7 to 10)).   OXYGEN Inhale 2 L into the lungs as needed (short of breath). 2 liters as needed   pantoprazole 40 MG tablet Commonly known as: PROTONIX TAKE ONE (1) TABLET EACH DAY   sildenafil 20 MG tablet Commonly known as: REVATIO Take 1-3 tablets (20-60 mg total) by mouth daily as needed.   zinc gluconate 50 MG tablet Take 50 mg by mouth daily.        Objective:   BP 122/72   Pulse 71   Temp 98.1 F (36.7 C)   Ht 6' (1.829 m)   Wt 279 lb (126.6 kg)   SpO2 97%   BMI 37.84 kg/m   Wt Readings from Last 3 Encounters:  06/15/20 279 lb (126.6 kg)  05/22/20 282 lb (127.9 kg)  05/18/20 282 lb (127.9 kg)    Physical Exam Vitals and nursing note reviewed.  Constitutional:      General: He is not in acute distress.    Appearance: He is well-developed. He is not diaphoretic.  Eyes:     General: No scleral icterus.    Conjunctiva/sclera: Conjunctivae normal.  Neck:     Thyroid: No thyromegaly.  Musculoskeletal:        General: Tenderness (Bilateral low back pain) present. Normal range of motion.  Skin:    General: Skin is warm and dry.     Findings: No rash.  Neurological:     Mental Status: He is alert and oriented to person, place, and time.     Coordination: Coordination normal.  Psychiatric:        Behavior: Behavior normal.       Assessment & Plan:   Problem List Items Addressed This Visit      Musculoskeletal and Integument   Degenerative spondylolisthesis - Primary   Relevant Orders   Ambulatory referral to Pain Clinic    Other Visit Diagnoses    Controlled substance agreement broken          Patient failed contract by getting prescriptions from his neurosurgeon without authorization, we had given him extra last month of morphine on top of his usually Percocet and this was intended to get him through the surgery and he took it all before the surgery even happened and did not have any left and that is why he got some from his  surgeon Follow up plan: Return in about 2 months (around 08/15/2020), or if symptoms worsen or fail to improve, for Diabetes recheck.  Counseling provided for all of the vaccine components Orders Placed This Encounter  Procedures  . Ambulatory referral to [place], MD [facility] 06/15/2020, 3:16 PM

## 2020-06-19 DIAGNOSIS — H2513 Age-related nuclear cataract, bilateral: Secondary | ICD-10-CM | POA: Diagnosis not present

## 2020-06-19 DIAGNOSIS — H40013 Open angle with borderline findings, low risk, bilateral: Secondary | ICD-10-CM | POA: Diagnosis not present

## 2020-06-22 ENCOUNTER — Other Ambulatory Visit (HOSPITAL_COMMUNITY)
Admission: RE | Admit: 2020-06-22 | Discharge: 2020-06-22 | Disposition: A | Discharge status: Home / Self Care | Payer: Medicare HMO | Source: Ambulatory Visit | Attending: Internal Medicine | Admitting: Internal Medicine

## 2020-06-22 ENCOUNTER — Other Ambulatory Visit: Payer: Self-pay

## 2020-06-22 DIAGNOSIS — Z01812 Encounter for preprocedural laboratory examination: Secondary | ICD-10-CM | POA: Diagnosis not present

## 2020-06-22 DIAGNOSIS — Z20822 Contact with and (suspected) exposure to covid-19: Secondary | ICD-10-CM | POA: Insufficient documentation

## 2020-06-22 NOTE — Progress Notes (Signed)
Called patient around 3:00 he said he could make it by 4:00. He made it, he's checking in with Dayja Loveridge now 3:41. Nothing further needed.

## 2020-06-23 LAB — SARS CORONAVIRUS 2 (TAT 6-24 HRS): SARS Coronavirus 2: NEGATIVE

## 2020-06-26 ENCOUNTER — Ambulatory Visit (HOSPITAL_COMMUNITY)
Admission: RE | Admit: 2020-06-26 | Discharge: 2020-06-26 | Disposition: A | Discharge status: Home / Self Care | Payer: Medicare HMO | Source: Ambulatory Visit | Attending: Internal Medicine | Admitting: Internal Medicine

## 2020-06-26 ENCOUNTER — Other Ambulatory Visit: Payer: Self-pay

## 2020-06-26 DIAGNOSIS — J449 Chronic obstructive pulmonary disease, unspecified: Secondary | ICD-10-CM | POA: Insufficient documentation

## 2020-06-26 LAB — PULMONARY FUNCTION TEST
DL/VA % pred: 103 %
DL/VA: 4.12 ml/min/mmHg/L
DLCO unc % pred: 70 %
DLCO unc: 19.35 ml/min/mmHg
FEF 25-75 Post: 1.33 L/sec
FEF 25-75 Pre: 2.17 L/sec
FEF2575-%Change-Post: -38 %
FEF2575-%Pred-Post: 51 %
FEF2575-%Pred-Pre: 82 %
FEV1-%Change-Post: -9 %
FEV1-%Pred-Post: 64 %
FEV1-%Pred-Pre: 71 %
FEV1-Post: 2.24 L
FEV1-Pre: 2.47 L
FEV1FVC-%Change-Post: 1 %
FEV1FVC-%Pred-Pre: 104 %
FEV6-%Change-Post: -10 %
FEV6-%Pred-Post: 64 %
FEV6-%Pred-Pre: 71 %
FEV6-Post: 2.88 L
FEV6-Pre: 3.22 L
FEV6FVC-%Pred-Post: 105 %
FEV6FVC-%Pred-Pre: 105 %
FVC-%Change-Post: -10 %
FVC-%Pred-Post: 60 %
FVC-%Pred-Pre: 67 %
FVC-Post: 2.88 L
FVC-Pre: 3.22 L
Post FEV1/FVC ratio: 78 %
Post FEV6/FVC ratio: 100 %
Pre FEV1/FVC ratio: 77 %
Pre FEV6/FVC Ratio: 100 %
RV % pred: 95 %
RV: 2.47 L
TLC % pred: 77 %
TLC: 5.79 L

## 2020-06-26 MED ORDER — ALBUTEROL SULFATE (2.5 MG/3ML) 0.083% IN NEBU
2.5000 mg | INHALATION_SOLUTION | Freq: Once | RESPIRATORY_TRACT | Status: AC
  Administered 2020-06-26: 2.5 mg via RESPIRATORY_TRACT

## 2020-06-28 ENCOUNTER — Other Ambulatory Visit: Payer: Self-pay

## 2020-06-28 ENCOUNTER — Encounter: Payer: Self-pay | Admitting: Internal Medicine

## 2020-06-28 ENCOUNTER — Ambulatory Visit (INDEPENDENT_AMBULATORY_CARE_PROVIDER_SITE_OTHER): Payer: Medicare HMO | Admitting: Internal Medicine

## 2020-06-28 DIAGNOSIS — J449 Chronic obstructive pulmonary disease, unspecified: Secondary | ICD-10-CM

## 2020-06-28 DIAGNOSIS — J9611 Chronic respiratory failure with hypoxia: Secondary | ICD-10-CM

## 2020-06-28 NOTE — Patient Instructions (Addendum)
Try albuterol(combivent or neb)  15 min before an activity that you know would make you short of breath and see if it makes any difference and if makes none then don't take it after activity unless you can't catch your breath.  Your main breathing problem is your weight   To get the most out of exercise, you need to be continuously aware that you are short of breath, but never out of breath, for 30 minutes daily. As you improve, it will actually be easier for you to do the same amount of exercise  in  30 minutes so always push to the level where you are short of breath.    Please schedule a follow up office visit in 6 monthscall sooner if needed

## 2020-06-28 NOTE — Progress Notes (Signed)
Brian Harrington, male    DOB: 1946-06-25     MRN: [id number]    Brief patient profile:  76 yowm quit smoking 1992  With some doe at that point  improved but since  around 2015 gradually worse sob/cough > [facility] copd rx saba plus another inhaler "something else"  helped some then placed on 02 around 2016 at 2lpm  referred to pulmonary clinic 10/01/2018 by Dr   Dettinger for refractory cough / sob  / sarcoid but no evidence of copd at initial eval     History of Present Illness  10/01/2018 Pulmonary / new pt eval / [facility]   Re copd / thoroughly confused with meds  Chief Complaint  Patient presents with   Pulmonary Consult    Referred by Dr. 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 Pt c/o cough "since the weather got hot"- at times he produces large amounts of yellow sputum.    Dyspnea:  avg day can do 2 aisles at [employer] on 02  Cough: more than usual worse p arising /worse p incruse and hot weather > mostly beige color x sev tbsp per day Sleep: flat bed 30 degrees hob using pillows  SABA use: combivent/neb variably better p use, not really using neb often "just with colds' Taking symbicort 160 x 2  First thing in am then Incruse lunchtime  Some better with pred in past and just got another rx but hasn't started  rec Plan A = Automatic = Symbicort 80 Take 2 puffs first thing in am and then another 2 puffs about 12 hours later.  Stop Incruse  Plan B = Backup Only use your combivent  as a rescue medication   Plan C = Crisis - only use your albuterol nebulizer if you first try Plan B and it fails to help > ok to use the nebulizer up to every 4 hours but if start needing it regularly call for immediate appointment Augmentin 875 mg take one pill twice daily  X 10 days   Prednisone take it per Dr D Pepcid 20 mg after breakfast and continue protonix before supper  GERD  Diet  Please remember to go to the lab and x-ray department downstairs in the basement  for your tests - we will call you with  the results when they are available.  Please schedule a follow up office visit in 4 weeks, call sooner if needed with all medications /inhalers/ solutions in hand so we can verify exactly what you are taking     11/02/2018  Extended summary f/u ov/Wert re:  Sob ? All upper airway / did not bring any meds  Chief Complaint  Patient presents with   Follow-up    PFT's done today. Breathing is about the same. He is coughing less- non prod. He is using his combivent inhaler 2-3 x per day. He is using his neb about once per wk.   Dyspnea:  MMRC3 = can't walk 100 yards even at a slow pace at a flat grade s stopping due to sob   Cough: much better unless around perfume/  Sleeping:  Bed flat / wedge pillow o/w overt gerd SABA use: as above 02: none at hs/ sometimes daytime   Last time could tol brisk walk =  several years prior to OV   rec Pantoprazole (protonix) 40 mg   Take  30-60 min before first meal of the day and Pepcid (famotidine)  20 mg one after  supper or prior to bedtime until return to office - this is the best way to tell whether stomach acid is contributing to your problem.   GERD diet  Continue symbicort 80 Take 2 puffs first thing in am and then another 2 puffs about 12 hours later.  Only combivent use the combivent to catch your breath To get the most out of exercise, you need to be continuously aware that you are short of breath, but never out of breath, for 30 minutes daily.    05/19/2019  f/u ov/Wert re: cough variant asthma/ uacs  Obesity/ deconditioning Chief Complaint  Patient presents with   Follow-up    He states his breathing continues at his baseline, no new concerns. He needs a new nebulizer supplies.   Dyspnea:  MMRC2 = can't walk a nl pace on a flat grade s sob but does fine slow and flat 3 Cough: none Sleeping: on side falt bed SABA use: none 02: none  rec Use combivent one puff 15 min before activity to see what difference it makes Pt has my chart and needs  appt next available slot with all meds/ inhalers in hand    05/18/2020  Consultation/ pre-op clearance  ov/Wert re: obesity/ uacs  ? Asthma component / fully vaccinated for Covid 56 Chief Complaint  Patient presents with   Follow-up    Needing pulmonary clearance for back surgery. He states his breathing has been worse "when I go outside, the pollen".   He c/o wheezing and occ cough- prod and unsure of sputum color. He uses his albuterol inhaler and neb both several x per day.    Dyspnea:  Does fine inside grocery store, more limited by back / breathing pollen /dust makes it worse Cough: triggered by  dpi spiriva / cologne / drinks water in am to clear  Sleeping: on side bed is flat SABA use: using neb one half in am and lots of saba hfa 02: has tanks prn  Has overt HB, uses ppi and pepcid uses one of each in am only  rec Change protonix for now :  Take 30- 60 min before your first and last meals of the day  Continue famotidine but use it at bedtime only  GERD   Plan A = Automatic = Always=    symbicort 80 Take 2 puffs first thing in am and then another 2 puffs about 12 hours later.  Work on inhaler technique:  Plan B = Backup (to supplement plan A, not to replace it) Only use your combivent  inhaler as a rescue medication Plan C = Crisis (instead of Plan B but only if Plan B stops working) - only use your albuterol nebulizer if you first try Plan B Depomedrol 120 mg IM today  Take your nebulizer right before you head   for surgery         06/28/2020  f/u ov/Wert re:  Still  GOLD 0 copd  Chief Complaint  Patient presents with   Follow-up    Breathing has improved since last visit. He is using his Combivent inhaler 4 x per day and neb at least once per day.   Dyspnea:  MMRC2 = can't walk a nl pace on a flat grade s sob but does fine slow and flat eg shopping leaning on cart  Cough: minimal mucoid  Sleeping:  20 degrees with blocks  SABA use: combivent 4 x daily / neb qod  02:  has it doesn't use it  No obvious day to day or daytime variability or assoc excess/ purulent sputum or mucus plugs or hemoptysis or cp or chest tightness, subjective wheeze or overt sinus or hb symptoms.   Sleeping as above  without nocturnal  or early am exacerbation  of respiratory  c/o's or need for noct saba. Also denies any obvious fluctuation of symptoms with weather or environmental changes or other aggravating or alleviating factors except as outlined above   No unusual exposure hx or h/o childhood pna/ asthma or knowledge of premature birth.  Current Allergies, Complete Past Medical History, Past Surgical History, Family History, and Social History were reviewed in [employer] record.  ROS  The following are not active complaints unless bolded Hoarseness, sore throat, dysphagia, dental problems, itching, sneezing,  nasal congestion or discharge of excess mucus or purulent secretions, ear ache,   fever, chills, sweats, unintended wt loss or wt gain, classically pleuritic or exertional cp,  orthopnea pnd or arm/hand swelling  or leg swelling, presyncope, palpitations, abdominal pain, anorexia, nausea, vomiting, diarrhea  or change in bowel habits or change in bladder habits, change in stools or change in urine, dysuria, hematuria,  rash, arthralgias, visual complaints, headache, numbness, weakness or ataxia or problems with walking or coordination,  change in mood or  memory.        Current Meds  Medication Sig   albuterol (PROVENTIL) (2.5 MG/3ML) 0.083% nebulizer solution Take 3 mLs (2.5 mg total) by nebulization every 4 (four) hours as needed for wheezing or shortness of breath.   Aspirin-Acetaminophen-Caffeine (GOODYS EXTRA STRENGTH) 500-325-65 MG PACK Take 1 Package by mouth daily as needed (pain).   b complex vitamins tablet Take 1 tablet by mouth daily.   budesonide-formoterol (SYMBICORT) 80-4.5 MCG/ACT inhaler Take 2 puffs first thing in am and then  another 2 puffs about 12 hours later.   CALCIUM-VITAMIN D PO Take 1 tablet by mouth daily.   carisoprodol (SOMA) 350 MG tablet Take 1 tablet (350 mg total) by mouth 3 (three) times daily as needed. for muscle spams   cetirizine (ZYRTEC) 10 MG tablet Take 1 tablet (10 mg total) by mouth daily.   cyclobenzaprine (FLEXERIL) 10 MG tablet Take 1 tablet (10 mg total) by mouth 3 (three) times daily as needed for muscle spasms.   famotidine (PEPCID) 20 MG tablet Take one tablet within one hour of bedtime   fluticasone (FLONASE) 50 MCG/ACT nasal spray Place 1 spray into both nostrils 2 (two) times daily as needed for allergies or rhinitis.   Ipratropium-Albuterol (COMBIVENT RESPIMAT) 20-100 MCG/ACT AERS respimat Inhale 2 puffs into the lungs every 4 (four) hours as needed for wheezing or shortness of breath.   metoprolol tartrate (LOPRESSOR) 25 MG tablet Take 1 tablet (25 mg total) by mouth 2 (two) times daily.   mupirocin cream (BACTROBAN) 2 % Apply 1 application topically 2 (two) times daily.   NARCAN 4 MG/0.1ML LIQD nasal spray kit USE 1 SPRAY INTO THE NOSE ONCE FOR 1 DOSE (Patient taking differently: Place 1 spray into the nose once. )   oxyCODONE 10 MG TABS Take 1 tablet (10 mg total) by mouth every 3 (three) hours as needed for severe pain ((score 7 to 10)).   OXYGEN Inhale 2 L into the lungs as needed (short of breath). 2 liters as needed    pantoprazole (PROTONIX) 40 MG tablet TAKE ONE (1) TABLET EACH DAY   sildenafil (REVATIO) 20 MG tablet Take 1-3 tablets (20-60 mg total) by mouth daily  as needed.   zinc gluconate 50 MG tablet Take 50 mg by mouth daily.             Objective:    amb obese wm / slt gruff voice    06/28/2020         278 05/18/2020       282  05/19/2019       269   11/02/18 245 lb (111.1 kg)  10/01/18 242 lb (109.8 kg)  09/08/18 239 lb 3.2 oz (108.5 kg)     Vital signs reviewed  06/28/2020  - Note at rest 02 sats  96% on RA     reports full dentures   HEENT  : pt wearing mask not removed for exam due to covid -19 concerns.    NECK :  without JVD/Nodes/TM/ nl carotid upstrokes bilaterally   LUNGS: no acc muscle use,  Nl contour chest which is clear to A and P bilaterally without cough on insp or exp maneuvers   CV:  RRR  no s3 or murmur or increase in P2, and  Trace bilateral LE  edema   ABD: obese  soft and nontender with nl inspiratory excursion in the supine position. No bruits or organomegaly appreciated, bowel sounds nl  MS:  Nl gait/ ext warm without deformities, calf tenderness, cyanosis or clubbing No obvious joint restrictions   SKIN: warm and dry without lesions    NEURO:  alert, approp, nl sensorium with  no motor or cerebellar deficits apparent.          Assessment

## 2020-06-29 ENCOUNTER — Encounter: Payer: Self-pay | Admitting: Internal Medicine

## 2020-06-29 ENCOUNTER — Telehealth: Payer: Self-pay | Admitting: *Deleted

## 2020-06-29 DIAGNOSIS — J9611 Chronic respiratory failure with hypoxia: Secondary | ICD-10-CM

## 2020-06-29 NOTE — Assessment & Plan Note (Addendum)
ERV  18% on pfts 06/26/20 c/w obesity effects  Body mass index is 37.7 kg/m.  -  trending down slightly, encouraged Lab Results  Component Value Date   TSH 2.530 12/09/2017     Contributing to gerd risk/ doe/reviewed the need and the process to achieve and maintain neg calorie balance > defer f/u primary care including intermittently monitoring thyroid status          Each maintenance medication was reviewed in detail including emphasizing most importantly the difference between maintenance and prns and under what circumstances the prns are to be triggered using an action plan format where appropriate.  Total time for H and P, chart review, counseling, teaching devices and generating customized AVS unique to this office visit / charting = 20 min

## 2020-06-29 NOTE — Telephone Encounter (Signed)
-----   Message from Nyoka Cowden, MD sent at 06/29/2020  5:29 AM EDT ----- Forgot to d/c his 02 if he's not using - check to be sure that's ok with him first

## 2020-06-29 NOTE — Assessment & Plan Note (Signed)
10/01/2018   Walked RA x one lap @ 185 stopped due to  Sob no desats slow pace  -  05/18/2020   Walked RA  approx   400 ft  @ mod pace  stopped due to  Sob at end but sats still 98%     >>> No indication for 02 / rec he d/c.

## 2020-06-29 NOTE — Telephone Encounter (Signed)
Spoke with the pt and he states okay to d/c o2 bc he never uses this and would like it gone  Order sent to [facility]

## 2020-06-29 NOTE — Assessment & Plan Note (Signed)
Quit smoking 1992  Spirometry 10/01/2018  FEV1 1.9 (52%)  Ratio 83 s curvature off all rx   - 10/01/2018  After extensive coaching inhaler device,  effectiveness =    75% with hfa > reduced symbicort to 80 2bid as cough is the main concern - [facility] AT screen  10/01/18  MM/ level 185  - PFT's  11/02/2018  FEV1 2.46 (77 % ) ratio 78  p 4 % improvement from saba p nothing prior to study with DLCO  67/70c % corrects to 89 % for alv volume   - 05/19/2019  After extensive coaching inhaler device,  effectiveness =    75% > continue symb 80 2bid as only maint  - PFT's  06/26/2020  FEV1 2.47 (71 % ) ratio 0.77  p 4 % improvement from saba p 0 prior to study with DLCO  19.35 (70%) corrects to 4.12 (103%)  for alv volume and FV curve  Min curvature  And ERV 18%  Main findings reviewed with pt, mild restriction with low erv c/w obesity.   I spent extra time with pt today reviewing appropriate use of albuterol for prn use on exertion with the following points: 1) saba is for relief of sob that does not improve by walking a slower pace or resting but rather if the pt does not improve after trying this first. 2) If the pt is convinced, as many are, that saba helps recover from activity faster then it's easy to tell if this is the case by re-challenging : ie stop, take the inhaler, then p 5 minutes try the exact same activity (intensity of workload) that just caused the symptoms and see if they are substantially diminished or not after saba 3) if there is an activity that reproducibly causes the symptoms, try the saba 15 min before the activity on alternate days   If in fact the saba really does help, then fine to continue to use it prn but advised may need to look closer at the maintenance regimen being used to achieve better control of airways disease with exertion.

## 2020-07-05 DIAGNOSIS — M129 Arthropathy, unspecified: Secondary | ICD-10-CM | POA: Diagnosis not present

## 2020-07-05 DIAGNOSIS — Z79899 Other long term (current) drug therapy: Secondary | ICD-10-CM | POA: Diagnosis not present

## 2020-07-05 DIAGNOSIS — G8929 Other chronic pain: Secondary | ICD-10-CM | POA: Diagnosis not present

## 2020-07-05 DIAGNOSIS — M545 Low back pain: Secondary | ICD-10-CM | POA: Diagnosis not present

## 2020-07-05 DIAGNOSIS — M542 Cervicalgia: Secondary | ICD-10-CM | POA: Diagnosis not present

## 2020-07-05 DIAGNOSIS — M549 Dorsalgia, unspecified: Secondary | ICD-10-CM | POA: Diagnosis not present

## 2020-07-12 DIAGNOSIS — M48062 Spinal stenosis, lumbar region with neurogenic claudication: Secondary | ICD-10-CM | POA: Diagnosis not present

## 2020-07-13 DIAGNOSIS — D8686 Sarcoid arthropathy: Secondary | ICD-10-CM | POA: Diagnosis not present

## 2020-07-13 DIAGNOSIS — M4310 Spondylolisthesis, site unspecified: Secondary | ICD-10-CM | POA: Diagnosis not present

## 2020-07-13 DIAGNOSIS — I7090 Unspecified atherosclerosis: Secondary | ICD-10-CM | POA: Diagnosis not present

## 2020-07-13 DIAGNOSIS — M4317 Spondylolisthesis, lumbosacral region: Secondary | ICD-10-CM | POA: Diagnosis not present

## 2020-08-09 ENCOUNTER — Ambulatory Visit: Payer: Medicare HMO | Admitting: Internal Medicine

## 2020-08-13 ENCOUNTER — Other Ambulatory Visit: Payer: Self-pay | Admitting: Internal Medicine

## 2020-08-13 ENCOUNTER — Other Ambulatory Visit: Payer: Self-pay | Admitting: Family Medicine

## 2020-08-13 DIAGNOSIS — M1990 Unspecified osteoarthritis, unspecified site: Secondary | ICD-10-CM | POA: Diagnosis not present

## 2020-08-13 DIAGNOSIS — Z79899 Other long term (current) drug therapy: Secondary | ICD-10-CM | POA: Diagnosis not present

## 2020-08-13 DIAGNOSIS — D8686 Sarcoid arthropathy: Secondary | ICD-10-CM | POA: Diagnosis not present

## 2020-08-13 DIAGNOSIS — J309 Allergic rhinitis, unspecified: Secondary | ICD-10-CM

## 2020-08-13 DIAGNOSIS — M4310 Spondylolisthesis, site unspecified: Secondary | ICD-10-CM | POA: Diagnosis not present

## 2020-08-13 DIAGNOSIS — M4317 Spondylolisthesis, lumbosacral region: Secondary | ICD-10-CM | POA: Diagnosis not present

## 2020-08-15 DIAGNOSIS — M48062 Spinal stenosis, lumbar region with neurogenic claudication: Secondary | ICD-10-CM | POA: Diagnosis not present

## 2020-09-11 ENCOUNTER — Ambulatory Visit: Payer: Medicare HMO | Admitting: Family Medicine

## 2020-11-27 ENCOUNTER — Ambulatory Visit (INDEPENDENT_AMBULATORY_CARE_PROVIDER_SITE_OTHER): Payer: Medicare Other | Admitting: *Deleted

## 2020-11-27 DIAGNOSIS — Z0000 Encounter for general adult medical examination without abnormal findings: Secondary | ICD-10-CM

## 2020-11-27 NOTE — Patient Instructions (Signed)

## 2020-11-27 NOTE — Progress Notes (Addendum)
MEDICARE ANNUAL WELLNESS VISIT  11/27/2020  Telephone Visit Disclaimer This Medicare AWV was conducted by telephone due to national recommendations for restrictions regarding the COVID-19 Pandemic (e.g. social distancing).  I verified, using two identifiers, that I am speaking with Brian Harrington or their authorized healthcare agent. I discussed the limitations, risks, security, and privacy concerns of performing an evaluation and management service by telephone and the potential availability of an in-person appointment in the future. The patient expressed understanding and agreed to proceed.  Location of Patient: home Location of Provider (nurse):  office  Subjective:    Brian Harrington is a 74 y.o. male patient of Dettinger, Fransisca Kaufmann, MD who had a Medicare Annual Wellness Visit today via telephone. Brian Harrington is Retired and lives alone. he has 3 children. he reports that he is socially active and does interact with friends/family regularly. he is markedly physically active and enjoys fishing and training dogs.  Patient Care Team: Dettinger, Fransisca Kaufmann, MD as PCP - General (Family Medicine) stone, nancy as Consulting Physician (Family Medicine) Danie Binder, MD (Inactive) as Consulting Physician (Gastroenterology)  Advanced Directives 11/27/2020 08/30/2019 11/20/2017 11/20/2017 09/07/2017 07/13/2017 07/13/2017  Does Patient Have a Medical Advance Directive? No No No No No No;Yes No  Type of Advance Directive - - - - - Press photographer;Living will -  Does patient want to make changes to medical advance directive? - - - - - No - Patient declined -  Copy of [place] in Chart? - - - - - No - copy requested -  Would patient like information on creating a medical advance directive? No - Patient declined No - Patient declined No - Patient declined - Yes (MAU/Ambulatory/Procedural Areas - Information given);No - Patient declined No - Patient declined -    Hospital  Utilization Over the Past 12 Months: # of hospitalizations or ER visits: 0 # of surgeries: 2  Review of Systems    Patient reports that his overall health is better compared to last year.  History obtained from chart review  Patient Reported Readings (BP, Pulse, CBG, Weight, etc) none  Pain Assessment Pain : 0-10 Pain Score: 4  Pain Type: Chronic pain Pain Location: Back Pain Descriptors / Indicators: Aching, Nagging, Shooting Pain Onset: More than a month ago Pain Frequency: Constant Pain Relieving Factors: Pain Medication Effect of Pain on Daily Activities: mild as long as he has his pain medication  Pain Relieving Factors: Pain Medication  Current Medications & Allergies (verified) Allergies as of 11/27/2020       Reactions   Amlodipine Besylate Shortness Of Breath, Swelling   tongue and facial swelling, difficulty breathing   Morphine And Related Itching   Sulfonamide Derivatives Nausea And Vomiting        Medication List        Accurate as of November 27, 2020 11:35 AM. If you have any questions, ask your nurse or doctor.          albuterol (2.5 MG/3ML) 0.083% nebulizer solution Commonly known as: PROVENTIL Take 3 mLs (2.5 mg total) by nebulization every 4 (four) hours as needed for wheezing or shortness of breath.   b complex vitamins tablet Take 1 tablet by mouth daily.   budesonide-formoterol 80-4.5 MCG/ACT inhaler Commonly known as: Symbicort Take 2 puffs first thing in am and then another 2 puffs about 12 hours later.   CALCIUM-VITAMIN D PO Take 1 tablet by mouth daily.   carisoprodol  350 MG tablet Commonly known as: SOMA Take 1 tablet (350 mg total) by mouth 3 (three) times daily as needed. for muscle spams   cetirizine 10 MG tablet Commonly known as: ZYRTEC Take 1 tablet (10 mg total) by mouth daily.   Combivent Respimat 20-100 MCG/ACT Aers respimat Generic drug: Ipratropium-Albuterol USE 1 PUFF EVERY 4 HOURS AS NEEDED    cyclobenzaprine 10 MG tablet Commonly known as: FLEXERIL Take 1 tablet (10 mg total) by mouth 3 (three) times daily as needed for muscle spasms.   famotidine 20 MG tablet Commonly known as: Pepcid Take one tablet within one hour of bedtime   fluticasone 50 MCG/ACT nasal spray Commonly known as: FLONASE USE 1 SPRAY IN EACH NOSTRIL TWICE DAILY   Goodys Extra Strength 500-325-65 MG Pack Generic drug: Aspirin-Acetaminophen-Caffeine Take 1 Package by mouth daily as needed (pain).   metoprolol tartrate 25 MG tablet Commonly known as: LOPRESSOR Take 1 tablet (25 mg total) by mouth 2 (two) times daily.   mupirocin cream 2 % Commonly known as: Bactroban Apply 1 application topically 2 (two) times daily.   Narcan 4 MG/0.1ML Liqd nasal spray kit Generic drug: naloxone USE 1 SPRAY INTO THE NOSE ONCE FOR 1 DOSE What changed: See the new instructions.   Oxycodone HCl 10 MG Tabs Take 1 tablet (10 mg total) by mouth every 3 (three) hours as needed for severe pain ((score 7 to 10)).   OXYGEN Inhale 2 L into the lungs as needed (short of breath). 2 liters as needed   pantoprazole 40 MG tablet Commonly known as: PROTONIX TAKE ONE (1) TABLET EACH DAY   sildenafil 20 MG tablet Commonly known as: REVATIO Take 1-3 tablets (20-60 mg total) by mouth daily as needed.   zinc gluconate 50 MG tablet Take 50 mg by mouth daily.        History (reviewed): Past Medical History:  Diagnosis Date   Arthritis    Back pain, chronic    CAD (coronary artery disease)    COPD (chronic obstructive pulmonary disease) (HCC)    on 2L home O2   Hypertension    Myocardial infarction ([place])    2010   Sarcoidosis of lung ([place])    reported by patient but no clear documentation of this condition   Sleep apnea    no CPAP   Past Surgical History:  Procedure Laterality Date   ANTERIOR LATERAL LUMBAR FUSION WITH PERCUTANEOUS SCREW 1 LEVEL Left 05/22/2020   Procedure: Extreme Lateral Interbody Fusion -  Lumbar one-Lumbar two - left with perc pedicle screws;  Surgeon: Earnie Larsson, MD;  Location: [facility];  Service: Neurosurgery;  Laterality: Left;   BACK SURGERY     CARDIAC CATHETERIZATION     bare-metal stent to left anterior descending   HAND SURGERY     NECK SURGERY  06/23/2017   Skin grafting left leg     Family History  Problem Relation Age of Onset   Arthritis Mother    Thrombocytopenia Mother    Diabetes Brother    Colon polyps Brother    Depression Father    CAD Neg Hx    Social History   Socioeconomic History   Marital status: Divorced    Spouse name: Not on file   Number of children: 3   Years of education: 11th grade-then got his GED   Highest education level: GED or equivalent  Occupational History   Occupation: Self employed-Farmer  Tobacco Use   Smoking status: Former Smoker  Packs/day: 1.00    Years: 25.00    Pack years: 25.00    Types: Cigarettes    Quit date: 09/23/1991    Years since quitting: 29.2   Smokeless tobacco: Never Used  Vaping Use   Vaping Use: Never used  Substance and Sexual Activity   Alcohol use: Yes    Alcohol/week: 6.0 standard drinks    Types: 6 Cans of beer per week   Drug use: Not Currently    Types: Marijuana    Comment: Hasn't used recently. Only used for pain management.    Sexual activity: Yes  Other Topics Concern   Not on file  Social History Narrative   Patient lives in [place].   Social Determinants of Health   Financial Resource Strain: Low Risk    Difficulty of Paying Living Expenses: Not hard at all  Food Insecurity: No Food Insecurity   Worried About Charity fundraiser in the Last Year: Never true   [place] in the Last Year: Never true  Transportation Needs: No Transportation Needs   Lack of Transportation (Medical): No   Lack of Transportation (Non-Medical): No  Physical Activity: Sufficiently Active   Days of Exercise per Week: 7 days   Minutes of Exercise per Session: 30 min  Stress: No Stress  Concern Present   Feeling of Stress : Not at all  Social Connections: Socially Isolated   Frequency of Communication with Friends and Family: More than three times a week   Frequency of Social Gatherings with Friends and Family: More than three times a week   Attends Religious Services: Never   Marine scientist or Organizations: No   Attends Archivist Meetings: Never   Marital Status: Divorced    Activities of Daily Living In your present state of health, do you have any difficulty performing the following activities: 11/27/2020 05/22/2020  Hearing? Y -  Comment he has 2 hearing aids ordered and will get them 11/03/20 -  Vision? Y -  Comment has surgery coming up for his eyes -  Difficulty concentrating or making decisions? N -  Walking or climbing stairs? N -  Dressing or bathing? N -  Doing errands, shopping? N N  Preparing Food and eating ? N -  Using the Toilet? N -  In the past six months, have you accidently leaked urine? N -  Do you have problems with loss of bowel control? N -  Managing your Medications? N -  Managing your Finances? N -  Housekeeping or managing your Housekeeping? N -  Some recent data might be hidden    Patient Education/ Literacy How often do you need to have someone help you when you read instructions, pamphlets, or other written materials from your doctor or pharmacy?: 1 - Never What is the last grade level you completed in school?: 11th grade then got his GED  Exercise Current Exercise Habits: Home exercise routine, Type of exercise: walking, Time (Minutes): 30, Frequency (Times/Week): 7, Weekly Exercise (Minutes/Week): 210, Intensity: Mild, Exercise limited by: orthopedic condition(s);respiratory conditions(s);cardiac condition(s)  Diet Patient reports consuming 2 meals a day and 2 snack(s) a day Patient reports that his primary diet is: Regular Patient reports that she does have regular access to food.   Depression Screen PHQ  2/9 Scores 11/27/2020 06/15/2020 05/11/2020 08/30/2019 08/12/2019 09/08/2018 06/07/2018  PHQ - 2 Score 0 0 0 0 0 1 0     Fall Risk Fall Risk  11/27/2020 06/15/2020  05/11/2020 08/30/2019 09/08/2018  Falls in the past year? 0 0 0 1 No  Number falls in past yr: - - - 0 -  Injury with Fall? - - - 0 -  Comment - - - - -  Follow up - - - Falls prevention discussed -  Comment - - - get rid of all throw rugs in the house, adequate lighting in walkways and grab bars in the bathroom -     Objective:  Brian Harrington seemed alert and oriented and he participated appropriately during our telephone visit.  Blood Pressure Weight BMI  BP Readings from Last 3 Encounters:  06/28/20 126/70  06/15/20 122/72  05/23/20 138/80   Wt Readings from Last 3 Encounters:  06/28/20 278 lb (126.1 kg)  06/15/20 279 lb (126.6 kg)  05/22/20 282 lb (127.9 kg)   BMI Readings from Last 1 Encounters:  06/28/20 37.70 kg/m    *Unable to obtain current vital signs, weight, and BMI due to telephone visit type  Hearing/Vision  Brian Harrington did not seem to have difficulty with hearing/understanding during the telephone conversation Reports that he has had a formal eye exam by an eye care professional within the past year Reports that he has had a formal hearing evaluation within the past year *Unable to fully assess hearing and vision during telephone visit type  Cognitive Function: 6CIT Screen 11/27/2020 08/30/2019  What Year? 0 points 0 points  What month? 0 points 0 points  What time? 0 points 0 points  Count back from 20 0 points 0 points  Months in reverse 2 points 0 points  Repeat phrase 2 points 0 points  Total Score 4 0   (Normal:0-7, Significant for Dysfunction: >8)  Normal Cognitive Function Screening: Yes   Immunization & Health Maintenance Record Immunization History  Administered Date(s) Administered   Fluad Quad(high Dose 65+) 11/11/2019   Influenza, High Dose Seasonal PF 09/07/2017   Influenza,inj,Quad PF,6+  Mos 09/16/2016, 11/02/2018   Influenza-Unspecified 11/13/2020   Moderna SARS-COVID-2 Vaccination 05/14/2020, 06/11/2020   Pneumococcal Conjugate-13 08/17/2017   Pneumococcal Polysaccharide-23 04/25/2011, 12/23/2013   Tdap 08/17/2017   Zoster Recombinat (Shingrix) 09/07/2017    Health Maintenance  Topic Date Due   COLONOSCOPY  06/16/2027   TETANUS/TDAP  08/18/2027   INFLUENZA VACCINE  Completed   COVID-19 Vaccine  Completed   Hepatitis C Screening  Completed   PNA vac Low Risk Adult  Completed       Assessment  This is a routine wellness examination for Brian Harrington.  Health Maintenance: Due or Overdue There are no preventive care reminders to display for this patient.  Brian Harrington does not need a referral for Community Assistance: Care Management:   no Social Work:    no Prescription Assistance:  no Nutrition/Diabetes Education:  no   Plan:  Personalized Goals Goals Addressed             This Visit's Progress    Weight (lb) < 200 lb (90.7 kg)         Personalized Health Maintenance & Screening Recommendations  Advanced directives: has NO advanced directive - not interested in additional information  Lung Cancer Screening Recommended: no (Low Dose CT Chest recommended if Age 72-80 years, 30 pack-year currently smoking OR have quit w/in past 15 years) Hepatitis C Screening recommended: no HIV Screening recommended: no  Advanced Directives: Written information was not prepared per patient's request.  Referrals & Orders No orders of the defined types were placed  in this encounter.   Follow-up Plan Follow-up with Dettinger, Fransisca Kaufmann, MD as planned Keep all appointments with your Specialists   I have personally reviewed and noted the following in the patient's chart:   Medical and social history Use of alcohol, tobacco or illicit drugs  Current medications and supplements Functional ability and status Nutritional status Physical activity Advanced  directives List of other physicians Hospitalizations, surgeries, and ER visits in previous 12 months Vitals Screenings to include cognitive, depression, and falls Referrals and appointments  In addition, I have reviewed and discussed with Brian Harrington certain preventive protocols, quality metrics, and best practice recommendations. A written personalized care plan for preventive services as well as general preventive health recommendations is available and can be mailed to the patient at his request.      Milas Hock, LPN  [id number]    I have reviewed and agree with the above AWV documentation.   Evelina Dun, FNP

## 2020-12-12 ENCOUNTER — Other Ambulatory Visit: Payer: Self-pay | Admitting: Family Medicine

## 2020-12-14 ENCOUNTER — Other Ambulatory Visit: Payer: Self-pay | Admitting: Family Medicine

## 2020-12-14 NOTE — Telephone Encounter (Signed)
Please advise 

## 2021-04-17 ENCOUNTER — Encounter: Payer: Self-pay | Admitting: Pulmonary Disease

## 2021-05-10 ENCOUNTER — Institutional Professional Consult (permissible substitution): Payer: Medicaid Other | Admitting: Pulmonary Disease

## 2022-06-19 ENCOUNTER — Ambulatory Visit (INDEPENDENT_AMBULATORY_CARE_PROVIDER_SITE_OTHER): Payer: No Typology Code available for payment source | Admitting: Internal Medicine

## 2022-06-19 ENCOUNTER — Encounter: Payer: Self-pay | Admitting: Internal Medicine

## 2022-06-19 ENCOUNTER — Telehealth: Payer: Self-pay | Admitting: Internal Medicine

## 2022-06-19 ENCOUNTER — Ambulatory Visit (HOSPITAL_COMMUNITY)
Admission: RE | Admit: 2022-06-19 | Discharge: 2022-06-19 | Disposition: A | Discharge status: Home / Self Care | Payer: No Typology Code available for payment source | Source: Ambulatory Visit | Attending: Internal Medicine | Admitting: Internal Medicine

## 2022-06-19 DIAGNOSIS — J449 Chronic obstructive pulmonary disease, unspecified: Secondary | ICD-10-CM | POA: Diagnosis present

## 2022-06-19 DIAGNOSIS — K219 Gastro-esophageal reflux disease without esophagitis: Secondary | ICD-10-CM

## 2022-06-19 MED ORDER — BUDESONIDE-FORMOTEROL FUMARATE 80-4.5 MCG/ACT IN AERO: INHALATION_SPRAY | RESPIRATORY_TRACT | 12 refills | Status: DC

## 2022-06-19 MED ORDER — PANTOPRAZOLE SODIUM 40 MG PO TBEC: DELAYED_RELEASE_TABLET | ORAL | 0 refills | Status: DC

## 2022-06-19 NOTE — Telephone Encounter (Signed)
Rx corrected. Nothing further needed.

## 2022-06-19 NOTE — Assessment & Plan Note (Addendum)
Quit smoking 1992  Spirometry 10/01/2018  FEV1 1.9 (52%)  Ratio 83 s curvature off all rx   - 10/01/2018  After extensive coaching inhaler device,  effectiveness =    75% with hfa > reduced symbicort to 80 2bid as cough is the main concern - Alpha One AT screen  10/01/18  MM/ level 185  - PFT's  11/02/2018  FEV1 2.46 (77 % ) ratio 78  p 4 % improvement from saba p nothing prior to study with DLCO  67/70c % corrects to 89 % for alv volume   - 05/19/2019  After extensive coaching inhaler device,  effectiveness =    75% > continue symb 80 2bid as only maint  - PFT's  06/26/2020  FEV1 2.47 (71 % ) ratio 0.77  p 4 % improvement from saba p 0 prior to study with DLCO  19.35 (70%) corrects to 4.12 (103%)  for alv volume and FV curve  Min curvature   - 06/19/2022   Walked on RA  x  3  lap(s) =  approx 450  ft  @ mod pace, stopped due to end of study  with lowest 02 sats 92% min sob     - The proper method of use, as well as anticipated side effects, of a metered-dose inhaler were discussed and demonstrated to the patient using teach back method.     DDX of  difficult airways management almost all start with A and  include Adherence, Ace Inhibitors, Acid Reflux, Active Sinus Disease, Alpha 1 Antitripsin deficiency, Anxiety masquerading as Airways dz,  ABPA,  Allergy(esp in young), Aspiration (esp in elderly), Adverse effects of meds,  Active smoking or vaping, A bunch of PE's (a small clot burden can't cause this syndrome unless there is already severe underlying pulm or vascular dz with poor reserve) plus two Bs  = Bronchiectasis and Beta blocker use..and one C= CHF    Adherence is always the initial "prime suspect" and is a multilayered concern that requires a "trust but verify" approach in every patient - starting with knowing how to use medications, especially inhalers, correctly, keeping up with refills and understanding the fundamental difference between maintenance and prns vs those medications only taken for  a very short course and then stopped and not refilled.  - see hfa teaching - return with all meds in hand using a trust but verify approach to confirm accurate Medication  Reconciliation The principal here is that until we are certain that the  patients are doing what we've asked, it makes no sense to ask them to do more.   ? Acid (or non-acid) GERD > always difficult to exclude as up to 75% of pts in some series report no assoc GI/ Heartburn symptoms> rec max (24h)  acid suppression and diet restrictions/ reviewed and instructions given in writing.   ? Adverse effect of trelegy on the upper airway mimicking AB > try symbicort 802bid  And f/u in 4-6 weeks  ? Allergy / asthma > doubt, should be able to cover with prn zyrted and low dose ICS   ? Anxiety/depression/ deconditioning  > usually at the bottom of this list of usual suspects but  may interfere with adherence and also interpretation of response or lack thereof to symptom management which can be quite subjective.   ?BB effects > usually not seen in relatively low doses of lopressor  ? chf / cardiac asthma d> no peripheral edema or reported orthopnea/ cm on cxr but  not effusions but need to keep this in the ddx         Each maintenance medication was reviewed in detail including emphasizing most importantly the difference between maintenance and prns and under what circumstances the prns are to be triggered using an action plan format where appropriate.  Total time for H and P, chart review, counseling, reviewing hfa/smi/neb  device(s) , directly observing portions of ambulatory 02 saturation study/ and generating customized AVS unique to this office visit / same day charting >  40 min for multiple  refractory respiratory  symptoms of uncertain etiology in pt not seen for almost 2 years

## 2022-06-19 NOTE — Patient Instructions (Addendum)
Plan A = Automatic = Always=    Symbicort 80 Take 2 puffs first thing in am and then another 2 puffs about 12 hours later.    Work on inhaler technique:  relax and gently blow all the way out then take a nice smooth full deep breath back in, triggering the inhaler at same time you start breathing in.  Hold for up to 5 seconds if you can. Blow out thru nose. Rinse and gargle with water when done.  If mouth or throat bother you at all,  try brushing teeth/gums/tongue with arm and hammer toothpaste/ make a slurry and gargle and spit out.       Plan B = Backup (to supplement plan A, not to replace it) Only use your albuterol inhaler as a rescue medication to be used if you can't catch your breath by resting or doing a relaxed purse lip breathing pattern.  - The less you use it, the better it will work when you need it. - Ok to use the inhaler up to 2 puffs  every 4 hours if you must but call for appointment if use goes up over your usual need - Don't leave home without it !!  (think of it like the spare tire for your car)   Plan C = Crisis (instead of Plan B but only if Plan B stops working) - only use your albuterol nebulizer if you first try Plan B and it fails to help > ok to use the nebulizer up to every 4 hours but if start needing it regularly call for immediate appointment  Pantoprazole (protonix) 40 mg   Take  30-60 min before first meal of the day and Pepcid (famotidine)  20 mg after supper until return to office - this is the best way to tell whether stomach acid is contributing to your problem.    Stop trelegy   GERD (REFLUX)  is an extremely common cause of respiratory symptoms just like yours , many times with no obvious heartburn at all.    It can be treated with medication, but also with lifestyle changes including elevation of the head of your bed (ideally with 6 -8inch blocks under the headboard of your bed),  Smoking cessation, avoidance of late meals, excessive alcohol, and avoid  fatty foods, chocolate, peppermint, colas, red wine, and acidic juices such as orange juice.  NO MINT OR MENTHOL PRODUCTS SO NO COUGH DROPS  USE SUGARLESS CANDY INSTEAD (Jolley ranchers or Stover's or Life Savers) or even ice chips will also do - the key is to swallow to prevent all throat clearing. NO OIL BASED VITAMINS - use powdered substitutes.  Avoid fish oil when coughing.   We will walk you today   Please remember to go to the  x-ray department  @  [facility] for your tests - we will call you with the results when they are available     Please schedule a follow up office visit in 4-6 weeks, call sooner if needed with all meds/inhalers

## 2022-07-10 ENCOUNTER — Other Ambulatory Visit: Payer: Self-pay | Admitting: Internal Medicine

## 2022-07-10 DIAGNOSIS — K219 Gastro-esophageal reflux disease without esophagitis: Secondary | ICD-10-CM

## 2022-07-28 ENCOUNTER — Ambulatory Visit: Payer: No Typology Code available for payment source | Admitting: Internal Medicine

## 2022-07-31 ENCOUNTER — Ambulatory Visit: Payer: No Typology Code available for payment source | Admitting: Internal Medicine

## 2022-07-31 NOTE — Progress Notes (Deleted)
Brian Harrington, male    DOB: [birth year]     MRN: [id number]    Brief patient profile:  27 yowm quit smoking 1992 @ 145 lb   With some doe at that point  improved but since  around 2015 gradually worse sob/cough > [facility] copd rx saba plus another inhaler "something else"  helped some then placed on 02 around 2016 at 2lpm  referred to pulmonary clinic 10/01/2018 by Dr   Brian Harrington for refractory cough / sob  / sarcoid but no evidence of copd at initial eval     History of Present Illness  10/01/2018 Pulmonary / new pt eval / Brian Harrington   Re copd / thoroughly confused with meds  Chief Complaint  Patient presents with   Pulmonary Consult    Referred by Dr. 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 Pt c/o cough "since the weather got hot"- at times he produces large amounts of yellow sputum.    Dyspnea:  avg day can do 2 aisles at [employer] on 02  Cough: more than usual worse p arising /worse p incruse and hot weather > mostly beige color x sev tbsp per day Sleep: flat bed 30 degrees hob using pillows  SABA use: combivent/neb variably better p use, not really using neb often "just with colds' Taking symbicort 160 x 2  First thing in am then Incruse lunchtime  Some better with pred in past and just got another rx but hasn't started  rec Plan A = Automatic = Symbicort 80 Take 2 puffs first thing in am and then another 2 puffs about 12 hours later.  Stop Incruse  Plan B = Backup Only use your combivent  as a rescue medication   Plan C = Crisis - only use your albuterol nebulizer if you first try Plan B and it fails to help > ok to use the nebulizer up to every 4 hours but if start needing it regularly call for immediate appointment Augmentin 875 mg take one pill twice daily  X 10 days   Prednisone take it per Dr Brian Harrington 20 mg after breakfast and continue protonix before supper  GERD  Diet  Please remember to go to the lab and x-ray department downstairs in the basement  for your tests - we will call  you with the results when they are available.  Please schedule a follow up office visit in 4 weeks, call sooner if needed with all medications /inhalers/ solutions in hand so we can verify exactly what you are taking     11/02/2018  Extended summary f/u ov/Brian Harrington re:  Sob ? All upper airway / did not bring any meds  Chief Complaint  Patient presents with   Follow-up    PFT's done today. Breathing is about the same. He is coughing less- non prod. He is using his combivent inhaler 2-3 x per day. He is using his neb about once per wk.   Dyspnea:  MMRC3 = can't walk 100 yards even at a slow pace at a flat grade s stopping due to sob   Cough: much better unless around perfume/  Sleeping:  Bed flat / wedge pillow o/w overt gerd SABA use: as above 02: none at hs/ sometimes daytime   Last time could tol brisk walk =  several years prior to OV   rec Pantoprazole (protonix) 40 mg   Take  30-60 min before first meal of the day and Brian Harrington (famotidine)  20 mg  one after supper or prior to bedtime until return to office - this is the best way to tell whether stomach acid is contributing to your problem.   GERD diet  Continue symbicort 80 Take 2 puffs first thing in am and then another 2 puffs about 12 hours later.  Only combivent use the combivent to catch your breath To get the most out of exercise, you need to be continuously aware that you are short of breath, but never out of breath, for 30 minutes daily.    05/19/2019  f/u ov/Brian Harrington re: cough variant asthma/ uacs  Obesity/ deconditioning Chief Complaint  Patient presents with   Follow-up    He states his breathing continues at his baseline, no new concerns. He needs a new nebulizer supplies.   Dyspnea:  MMRC2 = can't walk a nl pace on a flat grade s sob but does fine slow and flat 3 Cough: none Sleeping: on side falt bed SABA use: none 02: none  rec Use combivent one puff 15 min before activity to see what difference it makes Pt has my chart and  needs appt next available slot with all meds/ inhalers in hand    05/18/2020  Consultation/ pre-op clearance  ov/Brian Harrington re: obesity/ uacs  ? Asthma component / fully vaccinated for Covid 19 Chief Complaint  Patient presents with   Follow-up    Needing pulmonary clearance for back surgery. He states his breathing has been worse "when I go outside, the pollen".   He c/o wheezing and occ cough- prod and unsure of sputum color. He uses his albuterol inhaler and neb both several x per day.    Dyspnea:  Does fine inside grocery store, more limited by back / breathing pollen /dust makes it worse Cough: triggered by  dpi spiriva / cologne / drinks water in am to clear  Sleeping: on side bed is flat SABA use: using neb one half in am and lots of saba hfa 02: has tanks prn  Has overt HB, uses ppi and Brian Harrington uses one of each in am only  rec Change protonix for now :  Take 30- 60 min before your first and last meals of the day  Continue famotidine but use it at bedtime only  GERD   Plan A = Automatic = Always=    symbicort 80 Take 2 puffs first thing in am and then another 2 puffs about 12 hours later.  Work on inhaler technique:  Plan B = Backup (to supplement plan A, not to replace it) Only use your combivent  inhaler as a rescue medication Plan C = Crisis (instead of Plan B but only if Plan B stops working) - only use your albuterol nebulizer if you first try Plan B Depomedrol 120 mg IM today  Take your nebulizer right before you head  for surgery         06/28/2020  f/u ov/Brian Harrington re:  Still  GOLD 0 copd  Chief Complaint  Patient presents with   Follow-up    Breathing has improved since last visit. He is using his Combivent inhaler 4 x per day and neb at least once per day.   Dyspnea:  MMRC2 = can't walk a nl pace on a flat grade s sob but does fine slow and flat eg shopping leaning on cart  Cough: minimal mucoid  Sleeping:  20 degrees with blocks  SABA use: combivent 4 x daily / neb qod  02:  has it doesn't use  it  Rec Try albuterol(combivent or neb)  15 min before an activity that you know would make you short of breath and see if it makes any difference and if makes none then don't take it after activity unless you can't catch your breath. Your main breathing problem is your weight  To get the most out of exercise, you need to be continuously aware that you are short of breath, but never out of breath, for 30 minutes daily.      06/19/2022  f/u ov/[place] office/Khrystian Schauf re: COPD GOLD 0/ obesity  maint on trelegy  x one year   Chief Complaint  Patient presents with   Follow-up    COPD ref by [facility].  Coughing.   Was doing "fine" on trelegy but now several times a year needs prednisone and daily saba  Dyspnea:  worse with coughing fits  Cough: severe to point takes his breath away / chokes  Sleeping: 20 degrees with bed blocks  SABA use: combivent/ neb  02: has POC out of pocket  Covid status: vax x all  Rec Plan A = Automatic = Always=    Symbicort 80 Take 2 puffs first thing in am and then another 2 puffs about 12 hours later.   Work on inhaler technique:  Plan B = Backup (to supplement plan A, not to replace it) Only use your albuterol inhaler as a rescue medication )  Plan C = Crisis (instead of Plan B but only if Plan B stops working) - only use your albuterol nebulizer if you first try Plan B and it fails to help  Pantoprazole (protonix) 40 mg   Take  30-60 min before first meal of the day and Brian Harrington (famotidine)  20 mg after supper until return to office - this is the best way to tell whether stomach acid is contributing to your problem.   Stop trelegy  GERD diet  We will walk you today  Please remember to go to the  x-ray department   :OK Please schedule a follow up office visit in 4-6 weeks, call sooner if needed with all meds/inhalers   07/31/2022  f/u ov/[place] office/Magalie Almon re: *** maint on ***  No chief complaint on file.   Dyspnea:  *** Cough:  *** Sleeping: *** SABA use: *** 02: *** Covid status: *** Lung cancer screening: ***   No obvious day to day or daytime variability or assoc excess/ purulent sputum or mucus plugs or hemoptysis or cp or chest tightness, subjective wheeze or overt sinus or hb symptoms.   *** without nocturnal  or early am exacerbation  of respiratory  c/o's or need for noct saba. Also denies any obvious fluctuation of symptoms with weather or environmental changes or other aggravating or alleviating factors except as outlined above   No unusual exposure hx or h/o childhood pna/ asthma or knowledge of premature birth.  Current Allergies, Complete Past Medical History, Past Surgical History, Family History, and Social History were reviewed in [employer] record.  ROS  The following are not active complaints unless bolded Hoarseness, sore throat, dysphagia, dental problems, itching, sneezing,  nasal congestion or discharge of excess mucus or purulent secretions, ear ache,   fever, chills, sweats, unintended wt loss or wt gain, classically pleuritic or exertional cp,  orthopnea pnd or arm/hand swelling  or leg swelling, presyncope, palpitations, abdominal pain, anorexia, nausea, vomiting, diarrhea  or change in bowel habits or change in bladder habits, change in stools or change  in urine, dysuria, hematuria,  rash, arthralgias, visual complaints, headache, numbness, weakness or ataxia or problems with walking or coordination,  change in mood or  memory.        No outpatient medications have been marked as taking for the 07/31/22 encounter (Appointment) with Nyoka Cowden, MD.                  Objective:     wts  07/31/2022         ***  06/19/2022       278   06/28/2020         278 05/18/2020       282  05/19/2019       269   11/02/18 245 lb (111.1 kg)  10/01/18 242 lb (109.8 kg)  09/08/18 239 lb 3.2 oz (108.5 kg)       Vital signs reviewed  07/31/2022  - Note at rest 02 sats  ***%  on ***   General appearance:    ***  full dentures ***      Assessment

## 2022-08-13 ENCOUNTER — Encounter: Payer: Self-pay | Admitting: Internal Medicine

## 2022-08-13 ENCOUNTER — Ambulatory Visit (INDEPENDENT_AMBULATORY_CARE_PROVIDER_SITE_OTHER): Payer: No Typology Code available for payment source | Admitting: Internal Medicine

## 2022-08-13 DIAGNOSIS — J449 Chronic obstructive pulmonary disease, unspecified: Secondary | ICD-10-CM

## 2022-08-13 DIAGNOSIS — J9611 Chronic respiratory failure with hypoxia: Secondary | ICD-10-CM

## 2022-08-13 DIAGNOSIS — R058 Other specified cough: Secondary | ICD-10-CM

## 2022-08-13 NOTE — Assessment & Plan Note (Signed)
10/01/2018   Walked RA x one lap @ 185 stopped due to  Sob no desats slow pace  -  05/18/2020   Walked RA  approx   400 ft  @ mod pace  stopped due to  Sob at end but sats still 98%    Still using 02 p ex and not checking sats at peak ex/ advised: Make sure you check your oxygen saturation  AT  your highest level of activity (not after you stop)   to be sure it stays over 90% and adjust  02 flow upward to maintain this level if needed but remember to turn it back to previous settings when you stop (to conserve your supply).

## 2022-08-13 NOTE — Progress Notes (Signed)
Brian Harrington, male    DOB: [birth year]     MRN: [id number]    Brief patient profile:  81 yowm MM/quit smoking 1992 @ 145 lb   With some doe at that point  improved but since  around 2015 gradually worse sob/cough > [facility] dx copd rx saba plus another inhaler "something else"  helped some then placed on 02 around 2016 at 2lpm  referred to pulmonary clinic 10/01/2018 by Dr Dettinger for refractory cough / sob  / sarcoid but no evidence of copd at initial eval     History of Present Illness  10/01/2018 Pulmonary / new pt eval / Brian Harrington   Re copd / thoroughly confused with meds  Chief Complaint  Patient presents with   Pulmonary Consult    Referred by Dr. 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 Pt c/o cough "since the weather got hot"- at times he produces large amounts of yellow sputum.    Dyspnea:  avg day can do 2 aisles at [employer] on 02  Cough: more than usual worse p arising /worse p incruse and hot weather > mostly beige color x sev tbsp per day Sleep: flat bed 30 degrees hob using pillows  SABA use: combivent/neb variably better p use, not really using neb often "just with colds' Taking symbicort 160 x 2  First thing in am then Incruse lunchtime  Some better with pred in past and just got another rx but hasn't started  rec Plan A = Automatic = Symbicort 80 Take 2 puffs first thing in am and then another 2 puffs about 12 hours later.  Stop Incruse  Plan B = Backup Only use your combivent  as a rescue medication   Plan C = Crisis - only use your albuterol nebulizer if you first try Plan B and it fails to help > ok to use the nebulizer up to every 4 hours but if start needing it regularly call for immediate appointment Augmentin 875 mg take one pill twice daily  X 10 days   Prednisone take it per Dr D Pepcid 20 mg after breakfast and continue protonix before supper  GERD  Diet  Please remember to go to the lab and x-ray department downstairs in the basement  for your tests - we will call  you with the results when they are available.  Please schedule a follow up office visit in 4 weeks, call sooner if needed with all medications /inhalers/ solutions in hand so we can verify exactly what you are taking     11/02/2018  Extended summary f/u ov/Brian Harrington re:  Sob ? All upper airway / did not bring any meds  Chief Complaint  Patient presents with   Follow-up    PFT's done today. Breathing is about the same. He is coughing less- non prod. He is using his combivent inhaler 2-3 x per day. He is using his neb about once per wk.   Dyspnea:  MMRC3 = can't walk 100 yards even at a slow pace at a flat grade s stopping due to sob   Cough: much better unless around perfume/  Sleeping:  Bed flat / wedge pillow o/w overt gerd SABA use: as above 02: none at hs/ sometimes daytime   Last time could tol brisk walk =  several years prior to OV   rec Pantoprazole (protonix) 40 mg   Take  30-60 min before first meal of the day and Pepcid (famotidine)  20 mg one after  supper or prior to bedtime until return to office - this is the best way to tell whether stomach acid is contributing to your problem.   GERD diet  Continue symbicort 80 Take 2 puffs first thing in am and then another 2 puffs about 12 hours later.  Only combivent use the combivent to catch your breath To get the most out of exercise, you need to be continuously aware that you are short of breath, but never out of breath, for 30 minutes daily.    05/19/2019  f/u ov/Brian Harrington re: cough variant asthma/ uacs  Obesity/ deconditioning Chief Complaint  Patient presents with   Follow-up    He states his breathing continues at his baseline, no new concerns. He needs a new nebulizer supplies.   Dyspnea:  MMRC2 = can't walk a nl pace on a flat grade s sob but does fine slow and flat 3 Cough: none Sleeping: on side falt bed SABA use: none 02: none  rec Use combivent one puff 15 min before activity to see what difference it makes Pt has my chart and  needs appt next available slot with all meds/ inhalers in hand    05/18/2020  Consultation/ pre-op clearance  ov/Brian Harrington re: obesity/ uacs  ? Asthma component / fully vaccinated for Covid 48 Chief Complaint  Patient presents with   Follow-up    Needing pulmonary clearance for back surgery. He states his breathing has been worse "when I go outside, the pollen".   He c/o wheezing and occ cough- prod and unsure of sputum color. He uses his albuterol inhaler and neb both several x per day.    Dyspnea:  Does fine inside grocery store, more limited by back / breathing pollen /dust makes it worse Cough: triggered by  dpi spiriva / cologne / drinks water in am to clear  Sleeping: on side bed is flat SABA use: using neb one half in am and lots of saba hfa 02: has tanks prn  Has overt HB, uses ppi and pepcid uses one of each in am only  rec Change protonix for now :  Take 30- 60 min before your first and last meals of the day  Continue famotidine but use it at bedtime only  GERD   Plan A = Automatic = Always=    symbicort 80 Take 2 puffs first thing in am and then another 2 puffs about 12 hours later.  Work on inhaler technique:  Plan B = Backup (to supplement plan A, not to replace it) Only use your combivent  inhaler as a rescue medication Plan C = Crisis (instead of Plan B but only if Plan B stops working) - only use your albuterol nebulizer if you first try Plan B Depomedrol 120 mg IM today  Take your nebulizer right before you head  for surgery         06/28/2020  f/u ov/Brian Harrington re:  Still  GOLD 0 copd  Chief Complaint  Patient presents with   Follow-up    Breathing has improved since last visit. He is using his Combivent inhaler 4 x per day and neb at least once per day.   Dyspnea:  MMRC2 = can't walk a nl pace on a flat grade s sob but does fine slow and flat eg shopping leaning on cart  Cough: minimal mucoid  Sleeping:  20 degrees with blocks  SABA use: combivent 4 x daily / neb qod  02:  has it doesn't use it  Rec Try albuterol(combivent or neb)  15 min before an activity that you know would make you short of breath and see if it makes any difference and if makes none then don't take it after activity unless you can't catch your breath. Your main breathing problem is your weight  To get the most out of exercise, you need to be continuously aware that you are short of breath, but never out of breath, for 30 minutes daily.      06/19/2022  f/u ov/[place] office/Taison Celani re: COPD GOLD 0/ obesity  maint on trelegy  x one year   Chief Complaint  Patient presents with   Follow-up    COPD ref by [facility].  Coughing.   Was doing "fine" on trelegy but now several times a year needs prednisone and daily saba  Dyspnea:  worse with coughing fits  Cough: severe to point takes his breath away / chokes  Sleeping: 20 degrees with bed blocks  SABA use: combivent/ neb  02: has POC out of pocket  Covid status: vax x all  Rec Plan A = Automatic = Always=    Symbicort 80 Take 2 puffs first thing in am and then another 2 puffs about 12 hours later.   Work on inhaler technique:  Plan B = Backup (to supplement plan A, not to replace it) Only use your albuterol inhaler as a rescue medication )  Plan C = Crisis (instead of Plan B but only if Plan B stops working) - only use your albuterol nebulizer if you first try Plan B and it fails to help  Pantoprazole (protonix) 40 mg   Take  30-60 min before first meal of the day and Pepcid (famotidine)  20 mg after supper until return to office - this is the best way to tell whether stomach acid is contributing to your problem.   Stop trelegy  GERD diet  We will walk you today  Please remember to go to the  x-ray department   :OK Please schedule a follow up office visit in 4-6 weeks, call sooner if needed with all meds/inhalers   08/13/2022  f/u ov/[place] office/Michoel Kunin re: COPD GOLD 0/ coughing fits resolved while  maint on symbicort 80 2bid   Chief  Complaint  Patient presents with   Follow-up    Breathing doing well.   Dyspnea:  mostly with heavy exertion  Cough: none  Sleeping: bed blocks  SABA use: still too much combivent p ex  02: POC p ex also     No obvious day to day or daytime variability or assoc excess/ purulent sputum or mucus plugs or hemoptysis or cp or chest tightness, subjective wheeze or overt sinus or hb symptoms.   Sleeping  without nocturnal  or early am exacerbation  of respiratory  c/o's or need for noct saba. Also denies any obvious fluctuation of symptoms with weather or environmental changes or other aggravating or alleviating factors except as outlined above   No unusual exposure hx or h/o childhood pna/ asthma or knowledge of premature birth.  Current Allergies, Complete Past Medical History, Past Surgical History, Family History, and Social History were reviewed in [employer] record.  ROS  The following are not active complaints unless bolded Hoarseness, sore throat, dysphagia, dental problems, itching, sneezing,  nasal congestion or discharge of excess mucus or purulent secretions, ear ache,   fever, chills, sweats, unintended wt loss or wt gain, classically pleuritic or exertional cp,  orthopnea pnd or  arm/hand swelling  or leg swelling, presyncope, palpitations, abdominal pain, anorexia, nausea, vomiting, diarrhea  or change in bowel habits or change in bladder habits, change in stools or change in urine, dysuria, hematuria,  rash, arthralgias, visual complaints, headache, numbness, weakness or ataxia or problems with walking or coordination,  change in mood or  memory.        Current Meds  Medication Sig   albuterol (PROVENTIL) (2.5 MG/3ML) 0.083% nebulizer solution Take 3 mLs (2.5 mg total) by nebulization every 4 (four) hours as needed for wheezing or shortness of breath.   Aspirin-Acetaminophen-Caffeine (GOODYS EXTRA STRENGTH) 500-325-65 MG PACK Take 1 Package by mouth daily as  needed (pain).   b complex vitamins tablet Take 1 tablet by mouth daily.   budesonide-formoterol (SYMBICORT) 80-4.5 MCG/ACT inhaler Take 2 puffs first thing in am and then another 2 puffs about 12 hours later.   CALCIUM-VITAMIN D PO Take 1 tablet by mouth daily.   carisoprodol (SOMA) 350 MG tablet Take 1 tablet (350 mg total) by mouth 3 (three) times daily as needed. for muscle spams   cetirizine (ZYRTEC) 10 MG tablet Take 1 tablet (10 mg total) by mouth daily.   COMBIVENT RESPIMAT 20-100 MCG/ACT AERS respimat USE 1 PUFF EVERY 4 HOURS AS NEEDED   cyclobenzaprine (FLEXERIL) 10 MG tablet Take 1 tablet (10 mg total) by mouth 3 (three) times daily as needed for muscle spasms.   famotidine (PEPCID) 20 MG tablet Take one tablet within one hour of bedtime   fluticasone (FLONASE) 50 MCG/ACT nasal spray USE 1 SPRAY IN EACH NOSTRIL TWICE DAILY   metoprolol tartrate (LOPRESSOR) 25 MG tablet Take 1 tablet (25 mg total) by mouth 2 (two) times daily.   mupirocin cream (BACTROBAN) 2 % Apply 1 application topically 2 (two) times daily.   NARCAN 4 MG/0.1ML LIQD nasal spray kit USE 1 SPRAY INTO THE NOSE ONCE FOR 1 DOSE   oxyCODONE 10 MG TABS Take 1 tablet (10 mg total) by mouth every 3 (three) hours as needed for severe pain ((score 7 to 10)).   OXYGEN Inhale 2 L into the lungs as needed (short of breath). 2 liters as needed    pantoprazole (PROTONIX) 40 MG tablet TAKE 1 TABLET BY MOUTH 30-60 MINUTES BEFORE THE FIRST MEAL OF THE DAY   sildenafil (REVATIO) 20 MG tablet Take 1-3 tablets (20-60 mg total) by mouth daily as needed.   zinc gluconate 50 MG tablet Take 50 mg by mouth daily.                  Objective:     wts  08/13/2022       277  06/19/2022       278   06/28/2020         278 05/18/2020       282  05/19/2019       269   11/02/18 245 lb (111.1 kg)  10/01/18 242 lb (109.8 kg)  09/08/18 239 lb 3.2 oz (108.5 kg)       Vital signs reviewed  08/13/2022  - Note at rest 02 sats  98% on RA   General  appearance:    obese pleasant amb wm nad     HEENT : Oropharynx  clear/ edentulous     Nasal turbinates nl    NECK :  without  apparent JVD/ palpable Nodes/TM    LUNGS: no acc muscle use,  Nl contour chest which is clear to A and P  bilaterally without cough on insp or exp maneuvers   CV:  RRR  no s3 or murmur or increase in P2, and no edema   ABD:  obese soft and nontender with nl inspiratory excursion in the supine position. No bruits or organomegaly appreciated   MS:  Nl gait/ ext warm without deformities Or obvious joint restrictions  calf tenderness, cyanosis or clubbing    SKIN: warm and dry without lesions    NEURO:  alert, approp, nl sensorium with  no motor or cerebellar deficits apparent.       Assessment

## 2022-08-13 NOTE — Assessment & Plan Note (Signed)
Allergy profile 10/01/2018 >  Eos 0.4/  IgE 182  RAST pos dust only    - max rx for gerd 10/01/2018    Resolved on low dose symbicort and gerd rx and off dpi's > no change rx  .Please schedule a follow up visit in 3 months but call sooner if needed          Each maintenance medication was reviewed in detail including emphasizing most importantly the difference between maintenance and prns and under what circumstances the prns are to be triggered using an action plan format where appropriate.  Total time for H and P, chart review, counseling, reviewing hfa/02 device(s) and generating customized AVS unique to this office visit / same day charting = 22 min

## 2022-08-13 NOTE — Assessment & Plan Note (Signed)
Quit smoking 1992  Spirometry 10/01/2018  FEV1 1.9 (52%)  Ratio 83 s curvature off all rx   - 10/01/2018  After extensive coaching inhaler device,  effectiveness =    75% with hfa > reduced symbicort to 80 2bid as cough is the main concern - [facility] AT screen  10/01/18  MM/ level 185  - PFT's  11/02/2018  FEV1 2.46 (77 % ) ratio 78  p 4 % improvement from saba p nothing prior to study with DLCO  67/70c % corrects to 89 % for alv volume   - 05/19/2019  After extensive coaching inhaler device,  effectiveness =    75% > continue symb 80 2bid as only maint  - 06/19/2022   Walked on RA  x  3  lap(s) =  approx 450  ft  @ mod pace, stopped due to end of study  with lowest 02 sats 92% min sob  Pattern is on of asthma/ ? Cough variant controlled on symbicort 80 improved over baseline but still overusing combivent p ex Re SABA :  I spent extra time with pt today reviewing appropriate use of albuterol for prn use on exertion with the following points: 1) saba is for relief of sob that does not improve by walking a slower pace or resting but rather if the pt does not improve after trying this first. 2) If the pt is convinced, as many are, that saba helps recover from activity faster then it's easy to tell if this is the case by re-challenging : ie stop, take the inhaler, then p 5 minutes try the exact same activity (intensity of workload) that just caused the symptoms and see if they are substantially diminished or not after saba 3) if there is an activity that reproducibly causes the symptoms, try the saba 15 min before the activity on alternate days   If in fact the saba really does help, then fine to continue to use it prn but advised may need to look closer at the maintenance regimen being used to achieve better control of airways disease with exertion.

## 2022-08-13 NOTE — Patient Instructions (Signed)
Ok to try albuterol-ipatropium one pff  15 min before an activity (on alternating days)  that you know would usually make you short of breath and see if it makes any difference and if makes none then don't take albuterol after activity unless you can't catch your breath as this means it's the resting that helps, not the albuterol.   Please schedule a follow up visit in 6  months but call sooner if needed

## 2023-01-13 ENCOUNTER — Other Ambulatory Visit: Payer: Self-pay

## 2023-01-13 DIAGNOSIS — J449 Chronic obstructive pulmonary disease, unspecified: Secondary | ICD-10-CM

## 2023-02-22 NOTE — Progress Notes (Deleted)
Brian Harrington, male    DOB: [birth year]     MRN: [id number]    Brief patient profile:  81 yowm MM/quit smoking 1992 @ 145 lb   With some doe at that point  improved but since  around 2015 gradually worse sob/cough > [facility] dx copd rx saba plus another inhaler "something else"  helped some then placed on 02 around 2016 at 2lpm  referred to pulmonary clinic 10/01/2018 by Dr Dettinger for refractory cough / sob  / sarcoid but no evidence of copd at initial eval     History of Present Illness  10/01/2018 Pulmonary / new pt eval / Brian Harrington   Re copd / thoroughly confused with meds  Chief Complaint  Patient presents with   Pulmonary Consult    Referred by Dr. 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 Pt c/o cough "since the weather got hot"- at times he produces large amounts of yellow sputum.    Dyspnea:  avg day can do 2 aisles at [employer] on 02  Cough: more than usual worse p arising /worse p incruse and hot weather > mostly beige color x sev tbsp per day Sleep: flat bed 30 degrees hob using pillows  SABA use: combivent/neb variably better p use, not really using neb often "just with colds' Taking symbicort 160 x 2  First thing in am then Incruse lunchtime  Some better with pred in past and just got another rx but hasn't started  rec Plan A = Automatic = Symbicort 80 Take 2 puffs first thing in am and then another 2 puffs about 12 hours later.  Stop Incruse  Plan B = Backup Only use your combivent  as a rescue medication   Plan C = Crisis - only use your albuterol nebulizer if you first try Plan B and it fails to help > ok to use the nebulizer up to every 4 hours but if start needing it regularly call for immediate appointment Augmentin 875 mg take one pill twice daily  X 10 days   Prednisone take it per Dr D Pepcid 20 mg after breakfast and continue protonix before supper  GERD  Diet  Please remember to go to the lab and x-ray department downstairs in the basement  for your tests - we will call  you with the results when they are available.  Please schedule a follow up office visit in 4 weeks, call sooner if needed with all medications /inhalers/ solutions in hand so we can verify exactly what you are taking     11/02/2018  Extended summary f/u ov/Brian Harrington re:  Sob ? All upper airway / did not bring any meds  Chief Complaint  Patient presents with   Follow-up    PFT's done today. Breathing is about the same. He is coughing less- non prod. He is using his combivent inhaler 2-3 x per day. He is using his neb about once per wk.   Dyspnea:  MMRC3 = can't walk 100 yards even at a slow pace at a flat grade s stopping due to sob   Cough: much better unless around perfume/  Sleeping:  Bed flat / wedge pillow o/w overt gerd SABA use: as above 02: none at hs/ sometimes daytime   Last time could tol brisk walk =  several years prior to OV   rec Pantoprazole (protonix) 40 mg   Take  30-60 min before first meal of the day and Pepcid (famotidine)  20 mg one after  supper or prior to bedtime until return to office - this is the best way to tell whether stomach acid is contributing to your problem.   GERD diet  Continue symbicort 80 Take 2 puffs first thing in am and then another 2 puffs about 12 hours later.  Only combivent use the combivent to catch your breath To get the most out of exercise, you need to be continuously aware that you are short of breath, but never out of breath, for 30 minutes daily.    05/19/2019  f/u ov/Brian Harrington re: cough variant asthma/ uacs  Obesity/ deconditioning Chief Complaint  Patient presents with   Follow-up    He states his breathing continues at his baseline, no new concerns. He needs a new nebulizer supplies.   Dyspnea:  MMRC2 = can't walk a nl pace on a flat grade s sob but does fine slow and flat 3 Cough: none Sleeping: on side falt bed SABA use: none 02: none  rec Use combivent one puff 15 min before activity to see what difference it makes Pt has my chart and  needs appt next available slot with all meds/ inhalers in hand    05/18/2020  Consultation/ pre-op clearance  ov/Brian Harrington re: obesity/ uacs  ? Asthma component / fully vaccinated for Covid 48 Chief Complaint  Patient presents with   Follow-up    Needing pulmonary clearance for back surgery. He states his breathing has been worse "when I go outside, the pollen".   He c/o wheezing and occ cough- prod and unsure of sputum color. He uses his albuterol inhaler and neb both several x per day.    Dyspnea:  Does fine inside grocery store, more limited by back / breathing pollen /dust makes it worse Cough: triggered by  dpi spiriva / cologne / drinks water in am to clear  Sleeping: on side bed is flat SABA use: using neb one half in am and lots of saba hfa 02: has tanks prn  Has overt HB, uses ppi and pepcid uses one of each in am only  rec Change protonix for now :  Take 30- 60 min before your first and last meals of the day  Continue famotidine but use it at bedtime only  GERD   Plan A = Automatic = Always=    symbicort 80 Take 2 puffs first thing in am and then another 2 puffs about 12 hours later.  Work on inhaler technique:  Plan B = Backup (to supplement plan A, not to replace it) Only use your combivent  inhaler as a rescue medication Plan C = Crisis (instead of Plan B but only if Plan B stops working) - only use your albuterol nebulizer if you first try Plan B Depomedrol 120 mg IM today  Take your nebulizer right before you head  for surgery         06/28/2020  f/u ov/Brian Harrington re:  Still  GOLD 0 copd  Chief Complaint  Patient presents with   Follow-up    Breathing has improved since last visit. He is using his Combivent inhaler 4 x per day and neb at least once per day.   Dyspnea:  MMRC2 = can't walk a nl pace on a flat grade s sob but does fine slow and flat eg shopping leaning on cart  Cough: minimal mucoid  Sleeping:  20 degrees with blocks  SABA use: combivent 4 x daily / neb qod  02:  has it doesn't use it  Rec Try albuterol(combivent or neb)  15 min before an activity that you know would make you short of breath and see if it makes any difference and if makes none then don't take it after activity unless you can't catch your breath. Your main breathing problem is your weight  To get the most out of exercise, you need to be continuously aware that you are short of breath, but never out of breath, for 30 minutes daily.      06/19/2022  f/u ov/[place] office/Anntoinette Haefele re: COPD GOLD 0/ obesity  maint on trelegy  x one year   Chief Complaint  Patient presents with   Follow-up    COPD ref by [facility].  Coughing.   Was doing "fine" on trelegy but now several times a year needs prednisone and daily saba  Dyspnea:  worse with coughing fits  Cough: severe to point takes his breath away / chokes  Sleeping: 20 degrees with bed blocks  SABA use: combivent/ neb  02: has POC out of pocket  Covid status: vax x all  Rec Plan A = Automatic = Always=    Symbicort 80 Take 2 puffs first thing in am and then another 2 puffs about 12 hours later.   Work on inhaler technique:  Plan B = Backup (to supplement plan A, not to replace it) Only use your albuterol inhaler as a rescue medication )  Plan C = Crisis (instead of Plan B but only if Plan B stops working) - only use your albuterol nebulizer if you first try Plan B and it fails to help  Pantoprazole (protonix) 40 mg   Take  30-60 min before first meal of the day and Pepcid (famotidine)  20 mg after supper until return to office - this is the best way to tell whether stomach acid is contributing to your problem.   Stop trelegy  GERD diet  We will walk you today  Please remember to go to the  x-ray department   :OK Please schedule a follow up office visit in 4-6 weeks, call sooner if needed with all meds/inhalers   08/13/2022  f/u ov/[place] office/Herberto Ledwell re: COPD GOLD 0/ coughing fits resolved while  maint on symbicort 80 2bid   Chief  Complaint  Patient presents with   Follow-up    Breathing doing well.   Dyspnea:  mostly with heavy exertion  Cough: none  Sleeping: bed blocks  SABA use: still too much combivent p ex  02: POC p ex also  Rec Ok to try albuterol-ipatropium one pff  15 min before an activity (on alternating days)  that you know would usually make you short of breath   02/23/2023  f/u ov/[place] office/Analycia Khokhar re: *** maint on ***  No chief complaint on file.   Dyspnea:  *** Cough: *** Sleeping: *** SABA use: *** 02: *** Covid status: *** Lung cancer screening: ***   No obvious day to day or daytime variability or assoc excess/ purulent sputum or mucus plugs or hemoptysis or cp or chest tightness, subjective wheeze or overt sinus or hb symptoms.   *** without nocturnal  or early am exacerbation  of respiratory  c/o's or need for noct saba. Also denies any obvious fluctuation of symptoms with weather or environmental changes or other aggravating or alleviating factors except as outlined above   No unusual exposure hx or h/o childhood pna/ asthma or knowledge of premature birth.  Current Allergies, Complete Past Medical History, Past Surgical History, Family  History, and Social History were reviewed in [employer] record.  ROS  The following are not active complaints unless bolded Hoarseness, sore throat, dysphagia, dental problems, itching, sneezing,  nasal congestion or discharge of excess mucus or purulent secretions, ear ache,   fever, chills, sweats, unintended wt loss or wt gain, classically pleuritic or exertional cp,  orthopnea pnd or arm/hand swelling  or leg swelling, presyncope, palpitations, abdominal pain, anorexia, nausea, vomiting, diarrhea  or change in bowel habits or change in bladder habits, change in stools or change in urine, dysuria, hematuria,  rash, arthralgias, visual complaints, headache, numbness, weakness or ataxia or problems with walking or  coordination,  change in mood or  memory.        No outpatient medications have been marked as taking for the 02/23/23 encounter (Appointment) with Tanda Rockers, MD.                       Objective:     wts  02/23/2023       ***  08/13/2022       277  06/19/2022       278   06/28/2020         278 05/18/2020       282  05/19/2019       269   11/02/18 245 lb (111.1 kg)  10/01/18 242 lb (109.8 kg)  09/08/18 239 lb 3.2 oz (108.5 kg)      Vital signs reviewed  02/23/2023  - Note at rest 02 sats  ***% on ***   General appearance:    ***       Assessment

## 2023-02-23 ENCOUNTER — Ambulatory Visit: Payer: No Typology Code available for payment source | Admitting: Internal Medicine

## 2023-04-22 NOTE — Progress Notes (Deleted)
Brian Harrington, male    DOB: 04-Jan-1946     MRN: [id number]    Brief patient profile:  75  yowm MM/quit smoking 1992 @ 145 lb   With some doe at that point  improved but since  around 2015 gradually worse sob/cough > [facility] dx copd rx saba plus another inhaler "something else"  helped some then placed on 02 around 2016 at 2lpm  referred to pulmonary clinic 10/01/2018 by Brian Harrington for refractory cough / sob  / sarcoid but no evidence of copd at initial eval     History of Present Illness  10/01/2018 Pulmonary / new pt eval / Brian Harrington   Re copd / thoroughly confused with meds  Chief Complaint  Patient presents with   Pulmonary Consult    Referred by Brian. 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 Pt c/o cough "since the weather got hot"- at times he produces large amounts of yellow sputum.    Dyspnea:  avg day can do 2 aisles at [employer] on 02  Cough: more than usual worse p arising /worse p incruse and hot weather > mostly beige color x sev tbsp per day Sleep: flat bed 30 degrees hob using pillows  SABA use: combivent/neb variably better p use, not really using neb often "just with colds' Taking symbicort 160 x 2  First thing in am then Incruse lunchtime  Some better with pred in past and just got another rx but hasn't started  rec Plan A = Automatic = Symbicort 80 Take 2 puffs first thing in am and then another 2 puffs about 12 hours later.  Stop Incruse  Plan B = Backup Only use your combivent  as a rescue medication   Plan C = Crisis - only use your albuterol nebulizer if you first try Plan B and it fails to help > ok to use the nebulizer up to every 4 hours but if start needing it regularly call for immediate appointment Augmentin 875 mg take one pill twice daily  X 10 days   Prednisone take it per Brian Brian Harrington 20 mg after breakfast and continue protonix before supper  GERD  Diet  Please remember to go to the lab and x-ray department downstairs in the basement  for your tests - we will call  you with the results when they are available.  Please schedule a follow up Har[place] visit in 4 weeks, call sooner if needed with all medications /inhalers/ solutions in hand so we can verify exactly what you are taking     05/18/2020  Consultation/ pre-op clearance  ov/Brian Harrington re: obesity/ uacs  ? Asthma component / fully vaccinated for Covid 19 Chief Complaint  Patient presents with   Follow-up    Needing pulmonary clearance for back surgery. He states his breathing has been worse "when I go outside, the pollen".   He c/o wheezing and occ cough- prod and unsure of sputum color. He uses his albuterol inhaler and neb both several x per day.    Dyspnea:  Does fine inside grocery store, more limited by back / breathing pollen /dust makes it worse Cough: triggered by  dpi spiriva / cologne / drinks water in am to clear  Sleeping: on side bed is flat SABA use: using neb one half in am and lots of saba hfa 02: has tanks prn  Has overt HB, uses ppi and [place] uses one of each in am only  rec Change protonix for now :  Take 30- 60 min before your first and last meals of the day  Continue famotidine but use it at bedtime only  GERD   Plan A = Automatic = Always=    symbicort 80 Take 2 puffs first thing in am and then another 2 puffs about 12 hours later.  Work on inhaler technique:  Plan B = Backup (to supplement plan A, not to replace it) Only use your combivent  inhaler as a rescue medication Plan C = Crisis (instead of Plan B but only if Plan B stops working) - only use your albuterol nebulizer if you first try Plan B Depomedrol 120 mg IM today  Take your nebulizer right before you head  for surgery    06/19/2022  f/u ov/Brian Harrington/Brian Harrington re: COPD GOLD 0/ obesity  maint on trelegy  x one year   Chief Complaint  Patient presents with   Follow-up    COPD ref by Brian Harrington.  Coughing.   Was doing "fine" on trelegy but now several times a year needs prednisone and daily saba  Dyspnea:  worse with  coughing fits  Cough: severe to point takes his breath away / chokes  Sleeping: 20 degrees with bed blocks  SABA use: combivent/ neb  02: has POC out of pocket  Covid status: vax x all  Rec Plan A = Automatic = Always=    Symbicort 80 Take 2 puffs first thing in am and then another 2 puffs about 12 hours later.   Work on inhaler technique:  Plan B = Backup (to supplement plan A, not to replace it) Only use your albuterol inhaler as a rescue medication )  Plan C = Crisis (instead of Plan B but only if Plan B stops working) - only use your albuterol nebulizer if you first try Plan B and it fails to help  Pantoprazole (protonix) 40 mg   Take  30-60 min before first meal of the day and [place] (famotidine)  20 mg after supper until return to [place] - this is the best way to tell whether stomach acid is contributing to your problem.   Stop trelegy  GERD diet  We will walk you today  Please remember to go to the  x-ray department   :OK Please schedule a follow up [place] visit in 4-6 weeks, call sooner if needed with all meds/inhalers   08/13/2022  f/u ov/[place] Har[place]/Brian Harrington re: COPD GOLD 0/ coughing fits resolved while  maint on symbicort 80 2bid   Chief Complaint  Patient presents with   Follow-up    Breathing doing well.   Dyspnea:  mostly with heavy exertion  Cough: none  Sleeping: bed blocks  SABA use: still too much combivent p ex  02: POC p ex also  Rec Ok to try albuterol-ipatropium one pff  15 min before an activity (on alternating days)  that you know would usually make you short of breath   04/24/2023  f/u ov/[place] Harrington/Brian Harrington re: *** maint on ***  No chief complaint on file.   Dyspnea:  *** Cough: *** Sleeping: *** SABA use: *** 02: *** Covid status: *** Lung cancer screening: ***   No obvious day to day or daytime variability or assoc excess/ purulent sputum or mucus plugs or hemoptysis or cp or chest tightness, subjective wheeze or overt sinus or hb  symptoms.   *** without nocturnal  or early am exacerbation  of respiratory  c/o's or need for noct saba. Also denies any obvious fluctuation  of symptoms with weather or environmental changes or other aggravating or alleviating factors except as outlined above   No unusual exposure hx or h/o childhood pna/ asthma or knowledge of premature birth.  Current Allergies, Complete Past Medical History, Past Surgical History, Family History, and Social History were reviewed in [employer] record.  ROS  The following are not active complaints unless bolded Hoarseness, sore throat, dysphagia, dental problems, itching, sneezing,  nasal congestion or discharge of excess mucus or purulent secretions, ear ache,   fever, chills, sweats, unintended wt loss or wt gain, classically pleuritic or exertional cp,  orthopnea pnd or arm/hand swelling  or leg swelling, presyncope, palpitations, abdominal pain, anorexia, nausea, vomiting, diarrhea  or change in bowel habits or change in bladder habits, change in stools or change in urine, dysuria, hematuria,  rash, arthralgias, visual complaints, headache, numbness, weakness or ataxia or problems with walking or coordination,  change in mood or  memory.        No outpatient medications have been marked as taking for the 04/24/23 encounter (Appointment) with Nyoka Cowden, MD.                Objective:     wts  04/24/2023       ***  08/13/2022       277  06/19/2022       278   06/28/2020         278 05/18/2020       282  05/19/2019       269   11/02/18 245 lb (111.1 kg)  10/01/18 242 lb (109.8 kg)  09/08/18 239 lb 3.2 oz (108.5 kg)       Vital signs reviewed  04/24/2023  - Note at rest 02 sats  ***% on ***   General appearance:    ***          Assessment

## 2023-04-24 ENCOUNTER — Ambulatory Visit: Payer: No Typology Code available for payment source | Admitting: Internal Medicine

## 2023-06-15 ENCOUNTER — Other Ambulatory Visit (HOSPITAL_COMMUNITY): Payer: Self-pay | Admitting: Internal Medicine

## 2023-06-15 DIAGNOSIS — G8929 Other chronic pain: Secondary | ICD-10-CM

## 2023-07-08 ENCOUNTER — Other Ambulatory Visit: Payer: Self-pay | Admitting: Internal Medicine

## 2023-07-13 ENCOUNTER — Ambulatory Visit (HOSPITAL_COMMUNITY)
Admission: RE | Admit: 2023-07-13 | Discharge: 2023-07-13 | Disposition: A | Discharge status: Home / Self Care | Payer: No Typology Code available for payment source | Source: Ambulatory Visit | Attending: Internal Medicine | Admitting: Internal Medicine

## 2023-07-13 DIAGNOSIS — G8929 Other chronic pain: Secondary | ICD-10-CM | POA: Diagnosis present

## 2023-07-13 DIAGNOSIS — M545 Low back pain, unspecified: Secondary | ICD-10-CM | POA: Insufficient documentation

## 2024-07-24 NOTE — Progress Notes (Unsigned)
 Brian Harrington, male    DOB: 1946-08-04     MRN: [id number]    Brief patient profile:  54 yowm MM/quit smoking 1992 @ 145 lb   With some doe at that point  improved but since  around 2015 gradually worse sob/cough > [facility] dx copd rx saba plus another inhaler something else  helped some then placed on 02 around 2016 at 2lpm  referred to pulmonary clinic 10/01/2018 by Brian Harrington for refractory cough / sob  / sarcoid but no evidence of copd at initial eval   History of Present Illness  10/01/2018 Pulmonary / new pt eval / Brian Harrington   Re copd / thoroughly confused with meds  Chief Complaint  Patient presents with   Pulmonary Consult    Referred by Brian Harrington for 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 Pt c/o cough since the weather got hot- at times he produces large amounts of yellow sputum.    Dyspnea:  avg day can do 2 aisles at [employer] on 02  Cough: more than usual worse p arising /worse p incruse and hot weather > mostly beige color x sev tbsp per day Sleep: flat bed 30 degrees hob using pillows  SABA use: combivent/neb variably better p use, not really using neb often just with colds' Taking symbicort  160 x 2  First thing in am then Incruse lunchtime  Some better with pred in past and just got another rx but hasn't started  rec Plan A = Automatic = Symbicort  80 Take 2 puffs first thing in am and then another 2 puffs about 12 hours later.  Stop Incruse  Plan B = Backup Only use your combivent  as a rescue medication   Plan C = Crisis - only use your albuterol  nebulizer if you first try Plan B and it fails to help > ok to use the nebulizer up to every 4 hours but if start needing it regularly call for immediate appointment Augmentin  875 mg take one pill twice daily  X 10 days   Prednisone  take it per Brian Harrington  20 mg after breakfast and continue protonix  before supper  GERD  Diet  Please remember to go to the lab and x-ray department downstairs in the basement  for your tests - we will call you  with the results when they are available.  Please schedule a follow up office visit in 4 weeks, call sooner if needed with all medications /inhalers/ solutions in hand so we can verify exactly what you are taking   05/18/2020  Consultation/ pre-op clearance  ov/Brian Harrington re: obesity/ uacs  ? Asthma component / fully vaccinated for Covid 19 Chief Complaint  Patient presents with   Follow-up    Needing pulmonary clearance for back surgery. He states his breathing has been worse when I go outside, the pollen.   He c/o wheezing and occ cough- prod and unsure of sputum color. He uses his albuterol  inhaler and neb both several x per day.    Dyspnea:  Does fine inside grocery store, more limited by back / breathing pollen /dust makes it worse Cough: triggered by  dpi spiriva  / cologne / drinks water in am to clear  Sleeping: on side bed is flat SABA use: using neb one half in am and lots of saba hfa 02: has tanks prn  Has overt HB, uses ppi and Harrington  uses one of each in am only  rec Change protonix  for now :  Take 30- 60 min  before your first and last meals of the day  Continue famotidine  but use it at bedtime only  GERD   Plan A = Automatic = Always=    symbicort  80 Take 2 puffs first thing in am and then another 2 puffs about 12 hours later.  Work on inhaler technique:  Plan B = Backup (to supplement plan A, not to replace it) Only use your combivent  inhaler as a rescue medication Plan C = Crisis (instead of Plan B but only if Plan B stops working) - only use your albuterol  nebulizer if you first try Plan B Depomedrol 120 mg IM today  Take your nebulizer right before you head  for surgery    08/13/2022  f/u ov/[place] office/Brian Harrington re: COPD GOLD 0/ coughing fits resolved while  maint on symbicort  80 2bid   Chief Complaint  Patient presents with   Follow-up    Breathing doing well.   Dyspnea:  mostly with heavy exertion  Cough: none  Sleeping: bed blocks  SABA use: still too much combivent  p ex  02: POC p ex also   Rec Ok to try albuterol -ipatropium one pff  15 min before an activity (on alternating days)  that you know would usually make you short of breath   07/25/2024  PREO OP eval /[name]/Brian Harrington re: GOLD 0  maint on symbicort  80  prn using it one cannister in 2-3 months   Chief Complaint  Patient presents with   Follow-up   COPD   Dyspnea: back/ feetslowing him down more than breathing/ very sedentary   Cough: minimal / mucoid worse in am  Sleeping: bed blocks  s  resp cc noct  SABA use: rarely combivent  02: prn POC     No obvious day to day or daytime variability or assoc excess/ purulent sputum or mucus plugs or hemoptysis or cp or chest tightness, subjective wheeze or overt sinus or hb symptoms.    Also denies any obvious fluctuation of symptoms with weather or environmental changes or other aggravating or alleviating factors except as outlined above   No unusual exposure hx or h/o childhood pna/ asthma or knowledge of premature birth.  Current Allergies, Complete Past Medical History, Past Surgical History, Family History, and Social History were reviewed in [employer] record.  ROS  The following are not active complaints unless bolded Hoarseness, sore throat, dysphagia, dental problems, itching, sneezing,  nasal congestion or discharge of excess mucus or purulent secretions, ear ache,   fever, chills, sweats, unintended wt loss or wt gain, classically pleuritic or exertional cp,  orthopnea pnd or arm/hand swelling  or leg swelling, presyncope, palpitations, abdominal pain, anorexia, nausea, vomiting, diarrhea  or change in bowel habits or change in bladder habits, change in stools or change in urine, dysuria, hematuria,  rash, arthralgias, visual complaints, headache, numbness, weakness or ataxia or problems with walking or coordination,  change in mood or  memory.        Current Meds  Medication Sig   albuterol  (PROVENTIL ) (2.5  MG/3ML) 0.083% nebulizer solution Take 3 mLs (2.5 mg total) by nebulization every 4 (four) hours as needed for wheezing or shortness of breath.   Aspirin-Acetaminophen -Caffeine (GOODYS EXTRA STRENGTH) 500-325-65 MG PACK Take 1 Package by mouth daily as needed (pain).   b complex vitamins tablet Take 1 tablet by mouth daily.   budesonide -formoterol  (SYMBICORT ) 80-4.5 MCG/ACT inhaler USE 2 PUFFS FIRST THING IN THE MORNING &2 PUFFS ABOUT 12 HOURS LATER  CALCIUM -VITAMIN D  PO Take 1 tablet by mouth daily.   cetirizine  (ZYRTEC ) 10 MG tablet Take 1 tablet (10 mg total) by mouth daily.   COMBIVENT RESPIMAT 20-100 MCG/ACT AERS respimat USE 1 PUFF EVERY 4 HOURS AS NEEDED   famotidine  (Harrington ) 20 MG tablet Take one tablet within one hour of bedtime   fluticasone  (FLONASE ) 50 MCG/ACT nasal spray USE 1 SPRAY IN EACH NOSTRIL TWICE DAILY   metoprolol  tartrate (LOPRESSOR ) 25 MG tablet Take 1 tablet (25 mg total) by mouth 2 (two) times daily.   NARCAN  4 MG/0.1ML LIQD nasal spray kit USE 1 SPRAY INTO THE NOSE ONCE FOR 1 DOSE   OXYGEN  Inhale 2 L into the lungs as needed (short of breath). 2 liters as needed    pantoprazole  (PROTONIX ) 40 MG tablet TAKE 1 TABLET BY MOUTH 30-60 MINUTES BEFORE THE FIRST MEAL OF THE DAY   sildenafil  (REVATIO ) 20 MG tablet Take 1-3 tablets (20-60 mg total) by mouth daily as needed.   zinc  gluconate 50 MG tablet Take 50 mg by mouth daily.              Objective:     wts  07/25/2024       293 08/13/2022       277  06/19/2022       278   06/28/2020         278 05/18/2020       282  05/19/2019       269   11/02/18 245 lb (111.1 kg)  10/01/18 242 lb (109.8 kg)  09/08/18 239 lb 3.2 oz (108.5 kg)     Vital signs reviewed  07/25/2024  - Note at rest 02 sats  93% on RA   General appearance:    mod obese (by BMI) talkative wm nad    M3-4 airway/ clear lungs feet not examined    HEENT : Oropharynx  with M3-4 airway     Nasal turbinates nl    NECK :  without  apparent JVD/ palpable  Nodes/TM    LUNGS: no acc muscle use,  Nl contour chest which is clear to A and P bilaterally without cough on insp or exp maneuvers   CV:  RRR  no s3 or murmur or increase in P2, and no edema   ABD:  obese soft and nontender   MS:     ext warm without deformities Or obvious joint restrictions  calf tenderness, cyanosis or clubbing    SKIN: warm and dry without lesions    NEURO:  alert, approp, nl sensorium with  no motor or cerebellar deficits apparent.            Assessment

## 2024-07-25 ENCOUNTER — Encounter: Payer: Self-pay | Admitting: Internal Medicine

## 2024-07-25 ENCOUNTER — Ambulatory Visit (INDEPENDENT_AMBULATORY_CARE_PROVIDER_SITE_OTHER): Admitting: Internal Medicine

## 2024-07-25 VITALS — BP 131/64 | HR 67 | Ht 72.0 in | Wt 293.4 lb

## 2024-07-25 DIAGNOSIS — J449 Chronic obstructive pulmonary disease, unspecified: Secondary | ICD-10-CM | POA: Diagnosis not present

## 2024-07-25 DIAGNOSIS — J9611 Chronic respiratory failure with hypoxia: Secondary | ICD-10-CM

## 2024-07-25 NOTE — Patient Instructions (Addendum)
 Plan A = Automatic = Always=    Symbicort  80 2 puffs every 12 hours leading up to surgery   Plan B = Backup (to supplement plan A, not to replace it) Use your albuterol  inhaler as a rescue medication to be used if you can't catch your breath by resting or slowing your pace  or doing a relaxed purse lip breathing pattern.  - The less you use it, the better it will work when you need it. - Ok to use the inhaler up to 2 puffs  every 4 hours if you must but call for appointment if use goes up over your usual need - Don't leave home without it !!  (think of it like the spare tire or starter fluid for your car)   Plan C = Crisis (instead of Plan B but only if Plan B stops working) - only use your albuterol  nebulizer if you first try Plan B and it fails to help > ok to use the nebulizer up to every 4 hours but if start needing it regularly call for immediate appointment  Make sure you check your oxygen  saturation  AT  your highest level of activity (not after you stop)   to be sure it stays over 90% and adjust  02 flow upward on your POC to maintain this level if needed but remember to turn it back to previous settings when you stop (to conserve your supply).     If you are satisfied with your treatment plan,  let your doctor know and he/she can either refill your medications or you can return here when your prescription runs out.     If in any way you are not 100% satisfied,  please tell us .  If 100% better, tell your friends!  Pulmonary follow up is as needed     Add:  you are cleared for foot surgery

## 2024-07-26 NOTE — Assessment & Plan Note (Addendum)
 Quit smoking 1992  Spirometry 10/01/2018  FEV1 1.9 (52%)  Ratio 83 s curvature off all rx   - 10/01/2018  After extensive coaching inhaler device,  effectiveness =    75% with hfa > reduced symbicort  to 80 2bid as cough is the main concern - [facility] AT screen  10/01/18  MM/ level 185  - PFT's  11/02/2018  FEV1 2.46 (77 % ) ratio 78  p 4 % improvement from saba p nothing prior to study with DLCO  67/70c % corrects to 89 % for alv volume   - 05/19/2019  After extensive coaching inhaler device,  effectiveness =    75% > continue symb 80 2bid as only maint  - 06/19/2022   Walked on RA  x  3  lap(s) =  approx 450  ft  @ mod pace, stopped due to end of study  with lowest 02 sats 92% min sob    His main breathing problem remains obesity, not copd or difficult to control AB, so should do fine with foot surgery with increased risk dvt / PE noted   I cleared him for surgery today and generated a new ABC action plan for him moving forward - we can see him prn if he can get refills per PCP when needed.

## 2024-07-26 NOTE — Assessment & Plan Note (Addendum)
 10/01/2018   Walked RA x one lap @ 185 stopped due to  Sob no desats slow pace  -  05/18/2020   Walked RA  approx   400 ft  @ mod pace  stopped due to  Sob at end but sats still 98%    Using POC prn / advised:  Make sure you check your oxygen  saturation  AT  your highest level of activity (not after you stop)   to be sure it stays over 90% and adjust  02 flow upward to maintain this level if needed but remember to turn it back to previous settings when you stop (to conserve your supply).           Each maintenance medication was reviewed in detail including emphasizing most importantly the difference between maintenance and prns and under what circumstances the prns are to be triggered using an action plan format where appropriate.  Total time for H and P, chart review, counseling, reviewing hfa/neb/ 02/ pulse ox  device(s) and generating customized AVS unique to this office visit / same day charting = 34 min for pre op pt not seen in almost 2 years

## 2024-12-09 ENCOUNTER — Telehealth: Payer: Self-pay

## 2024-12-09 NOTE — Telephone Encounter (Signed)
 [facility] and [facility] need [facility] to them so printing off dr Leisa note

## 2024-12-09 NOTE — Telephone Encounter (Signed)
 Copied from CRM #[id number]. Topic: Clinical - Medical Advice >> Dec 08, 2024 12:55 PM Rilla B wrote: Reason for CRM: Dr Vinie from [place] in [place].  Patient is scheduled for surgery (amputation/toes) next Friday 12/19. Calling to see if  Dr Darlean needs to do a pre-auth evaluation prior to surgery.  Please call Dr Vinie @ [phone] ask to go to Nurses station for [facility]. Then ask nurse to speak with her.  Please advise
